# Patient Record
Sex: Female | Born: 1935
Health system: Southern US, Community
[De-identification: ages and names within clinical notes are randomized; demographics above are authoritative.]

## PROBLEM LIST (undated history)

## (undated) DIAGNOSIS — I639 Cerebral infarction, unspecified: Secondary | ICD-10-CM

## (undated) DIAGNOSIS — F329 Major depressive disorder, single episode, unspecified: Secondary | ICD-10-CM

## (undated) DIAGNOSIS — I10 Essential (primary) hypertension: Secondary | ICD-10-CM

## (undated) DIAGNOSIS — F32A Depression, unspecified: Secondary | ICD-10-CM

## (undated) DIAGNOSIS — K219 Gastro-esophageal reflux disease without esophagitis: Secondary | ICD-10-CM

## (undated) HISTORY — DX: Gastro-esophageal reflux disease without esophagitis: K21.9

## (undated) HISTORY — DX: Essential (primary) hypertension: I10

## (undated) HISTORY — PX: GALLBLADDER SURGERY: SHX652

## (undated) HISTORY — DX: Depression, unspecified: F32.A

## (undated) HISTORY — DX: Major depressive disorder, single episode, unspecified: F32.9

## (undated) HISTORY — DX: Cerebral infarction, unspecified: I63.9

## (undated) HISTORY — PX: ABDOMINAL HYSTERECTOMY: SHX81

---

## 2004-07-31 ENCOUNTER — Inpatient Hospital Stay: Payer: Self-pay | Admitting: Internal Medicine

## 2004-07-31 ENCOUNTER — Other Ambulatory Visit: Payer: Self-pay

## 2004-08-04 ENCOUNTER — Encounter: Payer: Self-pay | Admitting: Internal Medicine

## 2004-08-10 ENCOUNTER — Encounter: Payer: Self-pay | Admitting: Internal Medicine

## 2004-09-05 ENCOUNTER — Encounter: Payer: Self-pay | Admitting: Internal Medicine

## 2004-09-10 ENCOUNTER — Encounter: Payer: Self-pay | Admitting: Internal Medicine

## 2004-10-10 ENCOUNTER — Encounter: Payer: Self-pay | Admitting: Internal Medicine

## 2004-11-10 ENCOUNTER — Encounter: Payer: Self-pay | Admitting: Internal Medicine

## 2004-12-10 ENCOUNTER — Encounter: Payer: Self-pay | Admitting: Internal Medicine

## 2005-01-10 ENCOUNTER — Encounter: Payer: Self-pay | Admitting: Internal Medicine

## 2005-02-10 ENCOUNTER — Encounter: Payer: Self-pay | Admitting: Internal Medicine

## 2005-03-12 ENCOUNTER — Encounter: Payer: Self-pay | Admitting: Internal Medicine

## 2005-04-05 ENCOUNTER — Ambulatory Visit: Payer: Self-pay | Admitting: Family Medicine

## 2005-04-12 ENCOUNTER — Encounter: Payer: Self-pay | Admitting: Internal Medicine

## 2005-05-05 ENCOUNTER — Ambulatory Visit: Payer: Self-pay | Admitting: Family Medicine

## 2005-05-12 ENCOUNTER — Encounter: Payer: Self-pay | Admitting: Internal Medicine

## 2005-06-12 ENCOUNTER — Encounter: Payer: Self-pay | Admitting: Internal Medicine

## 2005-07-13 ENCOUNTER — Encounter: Payer: Self-pay | Admitting: Internal Medicine

## 2005-07-19 ENCOUNTER — Ambulatory Visit: Payer: Self-pay | Admitting: Family Medicine

## 2005-07-21 ENCOUNTER — Ambulatory Visit: Payer: Self-pay | Admitting: Family Medicine

## 2005-08-09 ENCOUNTER — Ambulatory Visit: Payer: Self-pay

## 2005-08-10 ENCOUNTER — Encounter: Payer: Self-pay | Admitting: Internal Medicine

## 2005-09-10 ENCOUNTER — Encounter: Payer: Self-pay | Admitting: Internal Medicine

## 2005-10-10 ENCOUNTER — Encounter: Payer: Self-pay | Admitting: Internal Medicine

## 2005-11-10 ENCOUNTER — Encounter: Payer: Self-pay | Admitting: Internal Medicine

## 2006-04-09 ENCOUNTER — Ambulatory Visit: Payer: Self-pay | Admitting: Ophthalmology

## 2006-04-16 ENCOUNTER — Ambulatory Visit: Payer: Self-pay | Admitting: Ophthalmology

## 2006-07-03 ENCOUNTER — Ambulatory Visit: Payer: Self-pay | Admitting: Gastroenterology

## 2007-10-16 ENCOUNTER — Ambulatory Visit: Payer: Self-pay | Admitting: Family Medicine

## 2007-12-02 ENCOUNTER — Encounter: Payer: Self-pay | Admitting: Urology

## 2007-12-11 ENCOUNTER — Encounter: Payer: Self-pay | Admitting: Urology

## 2008-01-11 ENCOUNTER — Encounter: Payer: Self-pay | Admitting: Urology

## 2008-02-11 ENCOUNTER — Encounter: Payer: Self-pay | Admitting: Urology

## 2008-05-19 ENCOUNTER — Ambulatory Visit: Payer: Self-pay | Admitting: Unknown Physician Specialty

## 2008-05-26 ENCOUNTER — Ambulatory Visit: Payer: Self-pay | Admitting: Unknown Physician Specialty

## 2008-07-06 ENCOUNTER — Ambulatory Visit: Payer: Self-pay | Admitting: Ophthalmology

## 2008-08-31 ENCOUNTER — Encounter: Payer: Self-pay | Admitting: Family Medicine

## 2008-09-10 ENCOUNTER — Encounter: Payer: Self-pay | Admitting: Family Medicine

## 2009-08-24 ENCOUNTER — Ambulatory Visit: Payer: Self-pay | Admitting: Family Medicine

## 2010-06-22 ENCOUNTER — Ambulatory Visit: Payer: Self-pay | Admitting: Gastroenterology

## 2010-07-20 ENCOUNTER — Ambulatory Visit: Payer: Self-pay | Admitting: Gastroenterology

## 2010-12-28 ENCOUNTER — Ambulatory Visit: Payer: Self-pay | Admitting: Family Medicine

## 2011-08-02 ENCOUNTER — Ambulatory Visit: Payer: Self-pay | Admitting: Family Medicine

## 2011-09-20 ENCOUNTER — Ambulatory Visit: Payer: Self-pay | Admitting: Gastroenterology

## 2011-12-01 ENCOUNTER — Emergency Department: Payer: Self-pay | Admitting: Emergency Medicine

## 2011-12-07 ENCOUNTER — Encounter: Payer: Self-pay | Admitting: Family Medicine

## 2011-12-11 ENCOUNTER — Encounter: Payer: Self-pay | Admitting: Family Medicine

## 2011-12-11 ENCOUNTER — Emergency Department: Payer: Self-pay | Admitting: Emergency Medicine

## 2012-01-01 ENCOUNTER — Ambulatory Visit: Payer: Self-pay | Admitting: Family Medicine

## 2012-01-11 ENCOUNTER — Encounter: Payer: Self-pay | Admitting: Family Medicine

## 2012-06-07 ENCOUNTER — Emergency Department: Payer: Self-pay | Admitting: Emergency Medicine

## 2013-02-05 ENCOUNTER — Emergency Department: Payer: Self-pay | Admitting: Emergency Medicine

## 2013-03-03 ENCOUNTER — Ambulatory Visit: Payer: Self-pay | Admitting: Orthopedic Surgery

## 2013-05-07 ENCOUNTER — Ambulatory Visit: Payer: Self-pay | Admitting: Family Medicine

## 2013-11-19 ENCOUNTER — Inpatient Hospital Stay: Payer: Self-pay | Admitting: Surgery

## 2013-11-19 LAB — URINALYSIS, COMPLETE
BACTERIA: NONE SEEN
Bilirubin,UR: NEGATIVE
GLUCOSE, UR: NEGATIVE mg/dL (ref 0–75)
Hyaline Cast: 3
Leukocyte Esterase: NEGATIVE
Nitrite: NEGATIVE
Ph: 6 (ref 4.5–8.0)
Protein: 30
RBC,UR: 2 /HPF (ref 0–5)
SPECIFIC GRAVITY: 1.018 (ref 1.003–1.030)

## 2013-11-19 LAB — CBC WITH DIFFERENTIAL/PLATELET
BASOS ABS: 0.1 10*3/uL (ref 0.0–0.1)
BASOS PCT: 1.2 %
EOS PCT: 2.1 %
Eosinophil #: 0.2 10*3/uL (ref 0.0–0.7)
HCT: 42.2 % (ref 35.0–47.0)
HGB: 14 g/dL (ref 12.0–16.0)
Lymphocyte #: 2 10*3/uL (ref 1.0–3.6)
Lymphocyte %: 18.2 %
MCH: 31.8 pg (ref 26.0–34.0)
MCHC: 33.1 g/dL (ref 32.0–36.0)
MCV: 96 fL (ref 80–100)
MONO ABS: 0.8 x10 3/mm (ref 0.2–0.9)
MONOS PCT: 6.9 %
Neutrophil #: 8 10*3/uL — ABNORMAL HIGH (ref 1.4–6.5)
Neutrophil %: 71.6 %
Platelet: 268 10*3/uL (ref 150–440)
RBC: 4.39 10*6/uL (ref 3.80–5.20)
RDW: 13.6 % (ref 11.5–14.5)
WBC: 11.2 10*3/uL — AB (ref 3.6–11.0)

## 2013-11-19 LAB — COMPREHENSIVE METABOLIC PANEL
ALT: 22 U/L (ref 12–78)
Albumin: 3.5 g/dL (ref 3.4–5.0)
Alkaline Phosphatase: 81 U/L
Anion Gap: 8 (ref 7–16)
BILIRUBIN TOTAL: 0.6 mg/dL (ref 0.2–1.0)
BUN: 16 mg/dL (ref 7–18)
CHLORIDE: 103 mmol/L (ref 98–107)
Calcium, Total: 9.5 mg/dL (ref 8.5–10.1)
Co2: 27 mmol/L (ref 21–32)
Creatinine: 1.14 mg/dL (ref 0.60–1.30)
EGFR (African American): 54 — ABNORMAL LOW
GFR CALC NON AF AMER: 46 — AB
Glucose: 96 mg/dL (ref 65–99)
OSMOLALITY: 277 (ref 275–301)
Potassium: 3.4 mmol/L — ABNORMAL LOW (ref 3.5–5.1)
SGOT(AST): 26 U/L (ref 15–37)
SODIUM: 138 mmol/L (ref 136–145)
Total Protein: 8.6 g/dL — ABNORMAL HIGH (ref 6.4–8.2)

## 2013-11-19 LAB — LIPASE, BLOOD: Lipase: 99 U/L (ref 73–393)

## 2013-11-19 LAB — TROPONIN I: Troponin-I: <0.02 ng/mL

## 2013-11-20 LAB — CBC WITH DIFFERENTIAL/PLATELET
Basophil #: 0.1 10*3/uL (ref 0.0–0.1)
Basophil %: 0.7 %
Eosinophil #: 0.4 10*3/uL (ref 0.0–0.7)
Eosinophil %: 3.3 %
HCT: 38.8 % (ref 35.0–47.0)
HGB: 12.7 g/dL (ref 12.0–16.0)
LYMPHS PCT: 22.6 %
Lymphocyte #: 2.5 10*3/uL (ref 1.0–3.6)
MCH: 31.4 pg (ref 26.0–34.0)
MCHC: 32.6 g/dL (ref 32.0–36.0)
MCV: 96 fL (ref 80–100)
Monocyte #: 0.9 x10 3/mm (ref 0.2–0.9)
Monocyte %: 8.1 %
NEUTROS ABS: 7.2 10*3/uL — AB (ref 1.4–6.5)
Neutrophil %: 65.3 %
Platelet: 208 10*3/uL (ref 150–440)
RBC: 4.02 10*6/uL (ref 3.80–5.20)
RDW: 13.7 % (ref 11.5–14.5)
WBC: 11 10*3/uL (ref 3.6–11.0)

## 2013-11-20 LAB — COMPREHENSIVE METABOLIC PANEL
ALBUMIN: 3.1 g/dL — AB (ref 3.4–5.0)
Alkaline Phosphatase: 70 U/L
Anion Gap: 10 (ref 7–16)
BILIRUBIN TOTAL: 0.6 mg/dL (ref 0.2–1.0)
BUN: 14 mg/dL (ref 7–18)
CREATININE: 0.94 mg/dL (ref 0.60–1.30)
Calcium, Total: 8.8 mg/dL (ref 8.5–10.1)
Chloride: 107 mmol/L (ref 98–107)
Co2: 25 mmol/L (ref 21–32)
EGFR (African American): >60
EGFR (Non-African Amer.): 59 — ABNORMAL LOW
Glucose: 98 mg/dL (ref 65–99)
OSMOLALITY: 284 (ref 275–301)
POTASSIUM: 3.1 mmol/L — AB (ref 3.5–5.1)
SGOT(AST): 18 U/L (ref 15–37)
SGPT (ALT): 19 U/L (ref 12–78)
SODIUM: 142 mmol/L (ref 136–145)
Total Protein: 7.3 g/dL (ref 6.4–8.2)

## 2013-11-21 LAB — CBC WITH DIFFERENTIAL/PLATELET
BASOS PCT: 0.9 %
Basophil #: 0.1 10*3/uL (ref 0.0–0.1)
Eosinophil #: 0.3 10*3/uL (ref 0.0–0.7)
Eosinophil %: 3.2 %
HCT: 37.6 % (ref 35.0–47.0)
HGB: 12.3 g/dL (ref 12.0–16.0)
Lymphocyte #: 2.2 10*3/uL (ref 1.0–3.6)
Lymphocyte %: 22.5 %
MCH: 31.5 pg (ref 26.0–34.0)
MCHC: 32.6 g/dL (ref 32.0–36.0)
MCV: 97 fL (ref 80–100)
Monocyte #: 0.8 x10 3/mm (ref 0.2–0.9)
Monocyte %: 8.3 %
NEUTROS ABS: 6.4 10*3/uL (ref 1.4–6.5)
NEUTROS PCT: 65.1 %
PLATELETS: 253 10*3/uL (ref 150–440)
RBC: 3.9 10*6/uL (ref 3.80–5.20)
RDW: 13.8 % (ref 11.5–14.5)
WBC: 9.8 10*3/uL (ref 3.6–11.0)

## 2013-11-21 LAB — COMPREHENSIVE METABOLIC PANEL
ALBUMIN: 3 g/dL — AB (ref 3.4–5.0)
ANION GAP: 10 (ref 7–16)
Alkaline Phosphatase: 64 U/L
BUN: 8 mg/dL (ref 7–18)
Bilirubin,Total: 0.5 mg/dL (ref 0.2–1.0)
CHLORIDE: 105 mmol/L (ref 98–107)
Calcium, Total: 8.7 mg/dL (ref 8.5–10.1)
Co2: 25 mmol/L (ref 21–32)
Creatinine: 0.77 mg/dL (ref 0.60–1.30)
EGFR (African American): >60
EGFR (Non-African Amer.): >60
Glucose: 83 mg/dL (ref 65–99)
Osmolality: 277 (ref 275–301)
Potassium: 3 mmol/L — ABNORMAL LOW (ref 3.5–5.1)
SGOT(AST): 25 U/L (ref 15–37)
SGPT (ALT): 25 U/L (ref 12–78)
SODIUM: 140 mmol/L (ref 136–145)
TOTAL PROTEIN: 7.3 g/dL (ref 6.4–8.2)

## 2013-11-22 LAB — CBC WITH DIFFERENTIAL/PLATELET
Basophil #: 0.1 10*3/uL (ref 0.0–0.1)
Basophil %: 1 %
EOS PCT: 3.6 %
Eosinophil #: 0.3 10*3/uL (ref 0.0–0.7)
HCT: 39.2 % (ref 35.0–47.0)
HGB: 13.3 g/dL (ref 12.0–16.0)
Lymphocyte #: 2.3 10*3/uL (ref 1.0–3.6)
Lymphocyte %: 28.6 %
MCH: 32.3 pg (ref 26.0–34.0)
MCHC: 33.8 g/dL (ref 32.0–36.0)
MCV: 96 fL (ref 80–100)
MONO ABS: 0.6 x10 3/mm (ref 0.2–0.9)
MONOS PCT: 7.4 %
NEUTROS ABS: 4.8 10*3/uL (ref 1.4–6.5)
NEUTROS PCT: 59.4 %
Platelet: 274 10*3/uL (ref 150–440)
RBC: 4.11 10*6/uL (ref 3.80–5.20)
RDW: 13.6 % (ref 11.5–14.5)
WBC: 8 10*3/uL (ref 3.6–11.0)

## 2013-11-22 LAB — BASIC METABOLIC PANEL
ANION GAP: 7 (ref 7–16)
BUN: 6 mg/dL — ABNORMAL LOW (ref 7–18)
CALCIUM: 8.9 mg/dL (ref 8.5–10.1)
CREATININE: 0.85 mg/dL (ref 0.60–1.30)
Chloride: 107 mmol/L (ref 98–107)
Co2: 29 mmol/L (ref 21–32)
EGFR (African American): >60
EGFR (Non-African Amer.): >60
GLUCOSE: 87 mg/dL (ref 65–99)
OSMOLALITY: 282 (ref 275–301)
Potassium: 3.2 mmol/L — ABNORMAL LOW (ref 3.5–5.1)
Sodium: 143 mmol/L (ref 136–145)

## 2013-11-25 LAB — PATHOLOGY REPORT

## 2014-04-03 ENCOUNTER — Ambulatory Visit: Payer: Self-pay | Admitting: Gastroenterology

## 2014-09-24 ENCOUNTER — Encounter
Admit: 2014-09-24 | Disposition: A | Discharge status: Home / Self Care | Payer: Self-pay | Attending: Family Medicine | Admitting: Family Medicine

## 2014-10-03 NOTE — Op Note (Signed)
PATIENT NAME:  Alyssa Adams, [name] MR#:  [id number] DATE OF BIRTH:  08/07/1933  DATE OF PROCEDURE:  11/22/2013  PREOPERATIVE DIAGNOSIS: Acute calculus cholecystitis.   POSTOPERATIVE DIAGNOSIS: Acute calculus cholecystitis.   PROCEDURE PERFORMED: Laparoscopic cholecystectomy.   SURGEON: Natale LayMark Tarrah Furuta, MD   ASSISTANT: None.   ANESTHESIA: General endotracheal.   FINDINGS: Acute cholecystitis.   SPECIMENS: Gallbladder with contents.   ESTIMATED BLOOD LOSS: 25 mL.   DRAINS: None.  LAP and needle count correct x 2.   DESCRIPTION OF PROCEDURE: With informed consent, supine position, general endotracheal anesthesia, the patient's abdomen was widely prepped and draped with ChloraPrep solution. Timeout was observed.   A 12-mm blunt Hasson trocar was placed in an open technique through an infraumbilical transverse skin incision with stay sutures being passed through the fascia. Pneumoperitoneum was established. The patient was then positioned in reverse Trendelenburg and airplane right side up. A 5-mm Bladeless trocar was placed in the epigastric region, two 5-mm first assistant ports the right subcostal margin. The gallbladder was aspirated of thin bile. It was grasped along its fundus and elevated towards the right shoulder and laterally. Omental adhesions were taken off the body of the gallbladder with blunt technique and point cautery and small bleeding vessels.   The hepatoduodenal ligament was then incised utilizing blunt technique and hook electrocautery. A single cystic duct and a single cystic artery were identified critically identified with a critical view of safety cholecystectomy. Three clips were placed on the portal side of each structure, 1 clip on the gallbladder side and the structures were then divided. The gallbladder was then retrieved off the gallbladder fossa utilizing hook cautery apparatus, placed into an Endo Catch device and retrieved. Pneumoperitoneum was then re-established.    Hemostasis was obtained in the gallbladder fossa with point cautery. Five milliliters of Surgiflo with thrombin application was applied. Ports were then removed under direct visualization. The infraumbilical fascial defect being reapproximated with an additional figure-of-eight #0 Vicryl suture in vertical orientation. The existing stay sutures tied to each other. Skin edges were reapproximated utilizing 4-0 nylon simple and vertical mattress orientation. A total of 30 mL of 0.25% plain Marcaine was infiltrated prior to the conclusion of the operation, Telfa and Tegaderm were then applied and the patient was then subsequently extubated and taken to the recovery room in stable and satisfactory condition by anesthesia services.    ____________________________ Redge GainerMark A. Egbert GaribaldiBird, MD mab:lt D: 11/22/2013 11:19:00 ET T: 11/22/2013 22:11:55 ET JOB#: [id number]  cc: Loraine LericheMark A. Egbert GaribaldiBird, MD, <Dictator> Raynald KempMARK A Nohely Whitehorn MD ELECTRONICALLY SIGNED 11/27/2013 10:57

## 2014-10-03 NOTE — Consult Note (Signed)
PATIENT NAME:  Alyssa Adams, Alyssa Adams MR#:  [id number] DATE OF BIRTH:  May 15, 1934  DATE OF CONSULTATION:  11/19/2013  REFERRING PHYSICIAN:   CONSULTING PHYSICIAN:  Adah Salvage. Excell Seltzer, MD  CHIEF COMPLAINT: Abdominal pain.   HISTORY OF PRESENT ILLNESS: This is a patient with the acute onset of abdominal pain on Sunday. Her pain is improved now, but she had multiple emeses, the last one being yesterday.  Finally, because it was unrelenting but improved, her husband made her come to the Emergency Room. She denies jaundice, denies fevers, denies acholic stools. Has never had an episode like this before and points to her epigastrium and right upper quadrant. She has no back pain. Has had multiple emeses most recently yesterday. Emesis started on Sunday. She has not been able to take any of her medications, including metoprolol or Aggrenox since her last dose was taken on Saturday.   Of note, the patient states that she is sometimes confused and she attributes this to her prior stroke in 2006 for which she has residual right-sided weakness, but no speech problems.   PAST MEDICAL HISTORY: Stroke and hypertension.   PAST SURGICAL HISTORY: Hysterectomy sparing the ovaries and appendix. No other abdominal surgery.   ALLERGIES: ERYTHROMYCIN, MACRODANTIN, SULFA, BANANAS, AND PEANUTS.   MEDICATIONS: Multiple, see chart, including Aggrenox and metoprolol.   FAMILY HISTORY: Noncontributory.   SOCIAL HISTORY: The patient was a Presenter, broadcasting for [employer], is now retired. Has never smoked and does not drink alcohol.  REVIEW OF SYSTEMS:  A 10-system review is performed and negative with the exception of that mentioned in the HPI.   PHYSICAL EXAMINATION:  GENERAL: Comfortable -appearing female patient.  VITAL SIGNS: Temperature 98.1, pulse of 80, respirations 20, blood pressure 182/80, 95% room air saturation, pain scale of 2.  HEENT:  Shows no scleral icterus.  NECK: No palpable neck nodes.  CHEST: Clear  to auscultation.  CARDIAC: Regular rate and rhythm.  ABDOMEN: Soft. There is a reducible, nontender umbilical hernia. She is tender in the right upper quadrant with a positive Murphy's sign.  EXTREMITIES: Show minimal edema.  NEUROLOGIC: Grossly intact with the exception of weakness in her right arm and grip. Her speech is normal.  INTEGUMENT:  Shows no jaundice.   LABORATORY VALUES: Showed normal liver function tests. Albumin is 3.5. White blood cell count is 11, H and H of 14 and 42, a platelet count of 268,000. An ultrasound confirms the presence of gallstones. It states that there is no sonographic Murphy's sign noted.  A 1.7 cm stone in the gallbladder with sludge and gallbladder wall thickening and pericholecystic fluid.   ASSESSMENT AND PLAN: This is a patient with likely acute cholecystitis by history and physical. Her vomiting has stopped, but she has not taken any of her medications since Saturday. She is on Aggrenox, which as an anticoagulant can be quite dangerous for surgery in a patient who has improved in her pain since the onset and who has a negative sonographic Murphy's sign, but I believe she has a clinical Murphy's sign. She should have surgery this hospitalization, but it would be safest to wait a few days to allow the effects of the Aggrenox to diminish, and I have asked the Emergency Room physician, Dr. Lenard Lance, to ask medicine to admit her and we would talk about surgery in a few days once IV antibiotics have been instituted and she has been placed back on her metoprolol, etc., holding her Aggrenox. The rationale for this has  been discussed and the patient understood and agreed with this plan as did Dr. Lenard LancePaduchowski.    ____________________________ Adah Salvageichard E. Excell Seltzerooper, MD rec:dd D: 11/19/2013 20:22:56 ET T: 11/19/2013 20:29:41 ET JOB#: [id number]  cc: Adah Salvageichard E. Excell Seltzerooper, MD, <Dictator> Lattie HawICHARD E Yosgart Pavey MD ELECTRONICALLY SIGNED 11/20/2013 6:54

## 2014-10-03 NOTE — Consult Note (Signed)
Brief Consult Note: Diagnosis: acute cholecystitis.   Patient was seen by consultant.   Consult note dictated.   Recommend further assessment or treatment.   Discussed with Attending MD.   Comments: Acute chole improving pain but needs surgery. Unfortunately pt is on Aggrenox, has not had dose nor metoprolol dose since Saturday. Rec med admit, optimize med management with IV abx and surgery once safe. Discussed with ER MD. Will follow.  Electronic Signatures: Lattie Hawooper, Aaro Meyers E (MD)  (Signed 10-Jun-15 20:10)  Authored: Brief Consult Note   Last Updated: 10-Jun-15 20:10 by Lattie Hawooper, Lindzey Zent E (MD)

## 2014-10-03 NOTE — H&P (Signed)
PATIENT NAME:  Alyssa Adams, Vernadine M MR#:  [id number] DATE OF BIRTH:  May 11, 1934  DATE OF ADMISSION:  11/19/2013  PRIMARY CARE PHYSICIAN: Dr. Tera MaterJim Hedrick.   REFERRING PHYSICIAN: Dr. Lenard LancePaduchowski.   CHIEF COMPLAINT: Nausea, vomiting, abdominal pain.   HISTORY OF PRESENT ILLNESS: Alyssa Adams is a 81 year old, pleasant, white female with history of hypertension, history of stroke about 10 years back with residual right-sided weakness. Has been experiencing nausea, vomiting since Sunday. Sunday morning, the patient woke up, started to experience pain in the right upper quadrant, radiating to the back. Followed by experiencing severe nausea. Since then has been experiencing multiple episodes of vomiting. Was unable to keep down any food or medication. The patient continued to have bloating and fullness of the stomach. Had small diarrhea today. Did not notice to have any fever. Concerning this, the patient came to the Emergency Department. Workup in the Emergency Department: The patient is found to have acute cholecystitis on ultrasound of the abdomen. The patient is found to have mild elevation of the WBC count of 11,000. The patient was seen by general surgery as a consult in the Emergency Department. Recommended the patient to be admitted to the medicine service as the patient has been on Aggrenox. The patient has been off of Aggrenox for the last 4 days. Surgery feels that the patient needs to wait for another 3 days before the patient proceeds for the surgery.   PAST MEDICAL HISTORY:  1. Hypertension.  2. Previous history of CVA.   PAST SURGICAL HISTORY: Hysterectomy.   ALLERGIES:  1. ERYTHROMYCIN.  2. MACRODANTIN.   3. SULFA.    HOME MEDICATIONS:  1. Protonix 40 mg once a day.  2. Metoprolol 50 mg once a day.  3.  75 mg once a day.  4. Amlodipine 5 mg once a day.  5. Aggrenox 25/200 mg once a day.   SOCIAL HISTORY: No history of smoking, drinking alcohol or using illicit drugs. Married,  lives with her husband. Walk with the help of a walker. Dependent of ADLs.   FAMILY HISTORY: Son died of diabetes mellitus and complications.   REVIEW OF SYSTEMS:  CONSTITUTIONAL: Experiencing generalized weakness.  EYES: No change in vision.  ENT: No change in hearing.  RESPIRATORY: No cough, shortness of breath.  CARDIOVASCULAR: No chest pain, palpations.  GASTROINTESTINAL: Has nausea, vomiting and abdominal pain.  GENITOURINARY: No dysuria or hematuria.  HEMATOLOGIC: No easy bruising or bleeding.  SKIN: No rashes or lesions.  MUSCULOSKELETAL: No joint pains and aches.  NEUROLOGIC: no weakness or numbness in any part of the body.   PHYSICAL EXAMINATION:  GENERAL: This is a well-built, well-nourished, age-appropriate female lying down in the bed, not in distress.  VITAL SIGNS: Temperature 98.1, pulse 74, blood pressure 167/73, respiratory rate of 20, oxygen saturation 94% on room air.  HEENT: Head normocephalic, atraumatic. There is no scleral icterus. Conjunctivae normal. Pupils equal and react to light. Extraocular movements are intact. Mucous membranes moist. No pharyngeal erythema.  NECK: Supple. No lymphadenopathy. No JVD. No carotid bruit.  CHEST: Has no focal tenderness.  LUNGS: Bilaterally clear to auscultation.  HEART: S1, S2 regular. No murmurs are heard.  ABDOMEN: Bowel sounds present. Soft. Has mild tenderness in the right upper quadrant. No rebound or guarding.  EXTREMITIES: No pedal edema. Pulses 2+.  NEUROLOGIC: The patient is alert, oriented to place, person and time. Cranial nerves II through XII intact. Motor 5/5 in upper and lower extremities.  SKIN: No rash or lesions.  MUSCULOSKELETAL: Good range of motion in all of the extremities.   LABORATORIES: UA negative for nitrites and leukocyte esterase. Lipase 99. CBC all of the values are within normal limits. CMP is completely within normal limits.   ASSESSMENT AND PLAN: Alyssa Adams is a 81 year old female who  comes with acute cholecystitis.  1. Acute cholecystitis: Keep the patient n.p.o. If the patient is able to tolerate liquids, will start the patient on clear liquids and start back on home medications. Will keep the patient on Zosyn. Surgery plans to do a cholecystectomy in the next 2 to 3 days. The patient denies having any recent stress test or any other cardiac workup recently. The patient has a history of stroke. As the patient did not have any recent cardiac workup and the patient having ischemic stroke, keep the patient at the risk of moderate risk for the urgency. Continue with the beta blocker. Hold the Aggrenox.   2. Hypertension: Moderately controlled. Start back on home medications once the patient is able to tolerate the diet.  3. History of cerebrovascular accident: Has mild residual weakness on the right side.  4. Keep the patient on deep vein thrombosis prophylaxis with Lovenox.   TIME SPENT: 50 minutes.    ____________________________ Susa Griffins, MD pv:gb D: 11/19/2013 22:01:22 ET T: 11/19/2013 22:46:29 ET JOB#: [id number]  cc: Susa Griffins, MD, <Dictator> Rhona Leavens. Burnett Sheng, MD Clerance Lav Tonie Vizcarrondo MD ELECTRONICALLY SIGNED 11/20/2013 21:11

## 2014-10-03 NOTE — Discharge Summary (Signed)
PATIENT NAME:  Alyssa Adams, Desira M MR#:  [id number] DATE OF BIRTH:  12-07-1933  DATE OF ADMISSION:  11/19/2013 DATE OF DISCHARGE:  11/26/2013  FINAL DIAGNOSES:  1.  Acute calculus cholecystitis.  2.  Hypertension.  3.  Previous cerebrovascular accident with need for Aggrenox and residual right-sided weakness.   PRINCIPAL PROCEDURES:  1.  Medicine consultation.  2.  Laparoscopic cholecystectomy on 06/13. 3.  Physical therapy evaluation. 4.  Case management evaluation.   HOSPITAL COURSE SUMMARY: The patient was admitted to the medical service. She was seen by medicine and admitted. Her Aggrenox was held. HIDA scan was performed which demonstrated nonvisualization of the gallbladder. This was performed on the 12th of June. Cholecystectomy was performed on the 13th of June. Postoperatively, she had an uneventful postoperative course, except for significant immobility secondary to previous CVA. Her diet was able to be advanced. She tolerated this well. Arrangements were made for home health with physical therapy assistance. She was stable for discharge on the 17th of June. Follow up in my office in one weeks' time.   ____________________________ Redge GainerMark A. Egbert GaribaldiBird, MD mab:sb D: 12/04/2013 21:30:18 ET T: 12/05/2013 08:12:14 ET JOB#: [id number]  cc: Loraine LericheMark A. Egbert GaribaldiBird, MD, <Dictator> MARK A BIRD MD ELECTRONICALLY SIGNED 12/08/2013 10:06

## 2014-10-05 LAB — SURGICAL PATHOLOGY

## 2014-10-13 ENCOUNTER — Encounter: Payer: Self-pay | Admitting: Physical Therapy

## 2014-10-15 ENCOUNTER — Encounter: Payer: Self-pay | Admitting: Physical Therapy

## 2014-10-15 ENCOUNTER — Ambulatory Visit: Payer: PPO | Admitting: Physical Therapy

## 2014-10-15 DIAGNOSIS — M6281 Muscle weakness (generalized): Secondary | ICD-10-CM | POA: Diagnosis not present

## 2014-10-15 DIAGNOSIS — R262 Difficulty in walking, not elsewhere classified: Secondary | ICD-10-CM | POA: Insufficient documentation

## 2014-10-15 NOTE — Therapy (Signed)
[facility] [address] [place], [place], [address] Phone: [phone]   Fax:  [phone]  Physical Therapy Treatment  Patient Details  Name: Alyssa FifeJanie M Adams MRN: [id number] Date of Birth: 12/15/1933 Referring Provider:  No ref. provider found  Encounter Date: 10/15/2014      PT End of Session - 10/15/14 1032    Visit Number 6   Number of Visits 16   Date for PT Re-Evaluation 11/19/14   PT Start Time 0938   PT Stop Time 1025   PT Time Calculation (min) 47 min   Equipment Utilized During Treatment Gait belt;Other (comment)  Wheeled walker, parallel bars.    Activity Tolerance Patient tolerated treatment well   Behavior During Therapy [facility] for tasks assessed/performed;Flat affect      Past Medical History  Diagnosis Date  . Depression     Past Surgical History  Procedure Laterality Date  . Gallbladder surgery    . Abdominal hysterectomy      There were no vitals filed for this visit.  Visit Diagnosis:  Difficulty in walking  Muscle weakness (generalized)      Subjective Assessment - 10/15/14 1029    Subjective Patient states she has had increasing difficulty with ambulation this week, she is unsure why.    Pertinent History Pt had a CVA impacting her R side.    Limitations Walking   Patient Stated Goals To improve her balance and independence with ambulation.    Currently in Pain? --  Pt does not complain of any pain today.        Gait Training  Knee flexion marching forwards x 10' x 4 repetitions with cuing for true knee flexion to promote erect posture. Patient with several incidents of lateral loss of balance.  Backwards ambulation x 10' x 2 repetitions with cuing for increased stance time on RLE  Tandem stance walking x 10' forwards and backwards x 6 repetitions total with cuing for increased stance time and maintaining RLE knee extension during weight bearing. Patient improved throughout the  session and eventually completed without any lateral loss of balance.  Patient ambulated 30' x 2 with wheeled walker with cue to "walk as fast as you can" and cues to promote right knee flexion to increase stride length and balance.  Independent ambulation in parallel bars with cuing for increased right LE weight shifting and knee flexion to initiate swing phase.   Neuromuscular Re-education  Single leg stance x 10 bilaterally with cuing for knee flexion, rather than hip flexion and knee extension. Pt was able to maintain <3 seconds on each attempt on RLE, LLE pt was able to complete 2x for 10+ seconds.  Tandem stance bilaterally on blue foam pad, cuing for hip extensor activation.  Step ups to 4" box x 10 repetitions with RLE on the box to initiaite.  Step ups to 6" box x 10 repetitions with RLE on the box to initiate. Cuing for knee extension on RLE and controlling lateral trunk displacement.  Standing hip abductions x 10 for 2 sets bilaterally, 1 HHA when weight bearing on RLE. Cuing for feet facing forward and not substituting TFL.  Standing hip extensions x 10 bilaterally cuing to maintain knee extension.                        PT Education - 10/15/14 1031    Education provided Yes   Education Details Patient cued on activating  her posterolateral hip musculature during single leg stance, shifting her weight to her RLE, keeping her right knee extended in weight bearing, and flexing her right knee to advance it in swing phase.    Person(s) Educated Patient   Methods Explanation;Demonstration;Tactile cues;Verbal cues   Comprehension Returned demonstration;Verbalized understanding;Tactile cues required             PT Long Term Goals - 10/15/14 1042    PT LONG TERM GOAL #1   Title Patient (>= 79 years old) will increase single limb stance time to > 14 seconds to reduce fall risk during single limb stance activities by 11/19/2014.   Status New   PT LONG TERM GOAL #2    Title Patient will complete a TUG test in < 12 seconds without an AD for independent mobility and decreased fall risk  by 11/19/2014.   Status New   PT LONG TERM GOAL #3   Title Patient (< 79 years old) will complete five times sit to stand test in < 10 seconds indicating an increased LE strength and improved balance  by 11/19/2014.   Status New               Plan - 10/15/14 1033    Clinical Impression Statement Pt progressed throughout session today with her ability to weight bear and maintain single leg balance on her RLE. She continues to present with decreased strength and dynamic balance on her RLE, impairing her safety with independent mobility. Pt responded well to cuing throughout the session and was able to independently step up to and off of a 6" step for 10 repetitions after initially struggling to ascend a 4" step. Pt would benefit from continued training for RLE strengthening, single leg stance on RLE, weight shifting to RLE, and dynamic balance.    Pt will benefit from skilled therapeutic intervention in order to improve on the following deficits Abnormal gait;Decreased endurance;Difficulty walking;Decreased strength;Decreased mobility;Impaired UE functional use;Decreased balance   Rehab Potential Good   Clinical Impairments Affecting Rehab Potential Patient has residual weakness and postural deficits from CVA impacting her R side.    PT Frequency 2x / week   PT Duration 8 weeks   PT Treatment/Interventions Gait training;Therapeutic exercise;Therapeutic activities   PT Next Visit Plan Continue to progress single leg activities and work on increasing her weight bearing on her RLE. Increase LE strengthening and dynamic gait activities for additional balance training.    PT Home Exercise Plan Patient states she has an existing home exercise program, it is not clear how adherent she is to it. She would benefit from an update to this exercise program.    Recommended Other Services  Occupational therapy if she is not already receiving.    Consulted and Agree with Plan of Care Patient        Problem List There are no active problems to display for this patient.  Kerin RansomPatrick A McNamara, PT, DPT    10/15/2014, 1:31 PM  [place] [facility] [address] [place], [place], [address] Phone: [phone]   Fax:  [phone]

## 2014-10-20 ENCOUNTER — Ambulatory Visit: Payer: PPO | Admitting: Physical Therapy

## 2014-10-20 DIAGNOSIS — M6281 Muscle weakness (generalized): Secondary | ICD-10-CM

## 2014-10-20 DIAGNOSIS — R262 Difficulty in walking, not elsewhere classified: Secondary | ICD-10-CM | POA: Diagnosis not present

## 2014-10-20 NOTE — Patient Instructions (Addendum)
HIP / KNEE: Extension - Sit to Stand   Sitting, lean chest forward, raise hips up from surface. Straighten hips and knees. Weight bear equally on left and right sides. Backs of legs should not push off surface. _8__ reps per set, _2__ sets per day, _1__ days per week Use assistive device as needed. Make sure you are in a safe spot, work on controlling your landing! Soft landings and make sure someone is near you.   Copyright  VHI. All rights reserved.   ABDUCTION: Standing (Active)   Stand, feet flat. Lift right leg out to side. Use red theraband. . Complete _2__ sets of _10__ repetitions. Perform __1_ sessions per day. ** Make sure you are holding on to something!  http://gtsc.exer.us/111   Copyright  VHI. All rights reserved. Marching in Place: Varied Surfaces   March in place, slowly lifting knees toward ceiling. Repeat __10__ times per session. Do __2__ sessions per day.  **Make sure you are holding on to something in front of you!  Copyright  VHI. All rights reserved.

## 2014-10-20 NOTE — Therapy (Signed)
[facility] [address] , [place], [address] Phone: [phone]   Fax:  [phone]  Physical Therapy Treatment  Patient Details  Name: Alyssa FifeJanie M Mcclellan MRN: [id number] Date of Birth: 10/17/1933 Referring Provider:  No ref. provider found  Encounter Date: 10/20/2014      PT End of Session - 10/20/14 1030    Visit Number 7   Number of Visits 16   Date for PT Re-Evaluation 11/19/14   PT Start Time 0935   PT Stop Time 1020   PT Time Calculation (min) 45 min   Equipment Utilized During Treatment Gait belt;Other (comment)   Activity Tolerance Patient tolerated treatment well   Behavior During Therapy [facility] for tasks assessed/performed;Flat affect      Past Medical History  Diagnosis Date  . Depression     Past Surgical History  Procedure Laterality Date  . Gallbladder surgery    . Abdominal hysterectomy      There were no vitals filed for this visit.  Visit Diagnosis:  Difficulty in walking  Muscle weakness (generalized)      Subjective Assessment - 10/20/14 1028    Subjective Patient states she has lost her previous HEP and has not been keeping up with exercises. She states she has been ambulating around the house on occassion without an AD, but has railing she holds on to.    Pertinent History Pt had a CVA impacting her R side.    Limitations Walking   Patient Stated Goals To improve her balance and independence with ambulation.    Currently in Pain? No/denies      Neuromuscular Re-education Standing hip abduction with red theraband x 10 bilaterally - cuing for keeping foot pointed forwards.  Single leg stance bilaterally x 10 repetitions, maximum holds on RLE < 3 seconds, > 10 seconds on LLE.  Sit to stand training from mat table 3 sets x 5 repetitions with cuing for going as fast as she can. Sit to stand training from chair with rollator walker x 5 repetitions, with cuing for anterior weight  shifting. Patient completed 5x sit to stand from chair in 30.28 seconds with cga x1.  Static single leg balance toe touching of cones x 6 bilaterally, cuing for midfoot weight bearing bilaterally to initiate single leg stance and to maintain level shoulders.    Gait Training  Lateral Walking in // bars bilaterally x 4 repetitions, patient cued to increase LLE stride length and flexing right knee to increase weight shifting to RLE.  Ambulation without AD on level surface with cuing for increased stride length on LLE, 6 x 15'. Patient completed turns independently as well, with cuing for taking shorter steps to maintain her COG over her BOS.  Ambulation with toe touching of cones x 2 repetitions and 8 cones. Patient was unable to keep cones upright secondary to decreased single leg stance balance.  Attempted backward ambulation x 10' outside of parallel bars with no AD, however she was not comfortable taking elongated steps with LLE secondary to decreased balance on RLE.  Tandem walking forwards and retro 6 repetitions total x 10' in // bars. Patient required increased stride length with RLE going backwards secondary to decreased single leg balance.                           PT Education - 10/20/14 1029    Education provided Yes   Education Details Patient  educated to ambulate with an assistive device at all times. Patient educated on HEP and provided handout. Patient encouraged to take longer steps with her LLE and flex her RLE to initiate swing phase.    Person(s) Educated Patient   Methods Explanation;Demonstration;Tactile cues;Verbal cues   Comprehension Verbalized understanding;Returned demonstration;Tactile cues required;Verbal cues required             PT Long Term Goals - 10/20/14 1043    PT LONG TERM GOAL #1   Title Patient (> 79 years old) will increase single limb stance time to > 14 seconds to reduce fall risk during single limb stance activities by  11/19/2014.   Status New   PT LONG TERM GOAL #2   Title Patient will complete a TUG test in < 12 seconds without an AD for independent mobility and decreased fall risk  by 11/19/2014.   Status New   PT LONG TERM GOAL #3   Title Patient (< 79 years old) will complete five times sit to stand test in < 10 seconds indicating an increased LE strength and improved balance  by 11/19/2014.   Status New               Plan - 10/20/14 1033    Clinical Impression Statement Patient displays improving dynamic balance as evidenced by independent ambulation outside of the parallel bars. Patient educated to ambulate only with walker in the home, as she has decreased balance during turns secondary to RLE weakness and decreased RLE single leg stance. Patient demonstrates improving LE strength as she is able to stand for sets of 5 repettitions from mat table, however she is unable to independelntly arise from a chair without AD and cga x 1. Skilled physical therapy is indicated to continue to address her LE weakness and decreased balance to promote safety with mobility.    Pt will benefit from skilled therapeutic intervention in order to improve on the following deficits Abnormal gait;Decreased endurance;Difficulty walking;Decreased strength;Decreased mobility;Impaired UE functional use;Decreased balance   Rehab Potential Good   Clinical Impairments Affecting Rehab Potential Patient has residual weakness and postural deficits from CVA impacting her R side.    PT Frequency 2x / week   PT Duration 8 weeks   PT Treatment/Interventions Gait training;Therapeutic exercise;Therapeutic activities;Neuromuscular re-education   PT Next Visit Plan Assess how HEP went, re-assess goals as needed. Continue to progress with dynamic and static balance activities.    PT Home Exercise Plan Patient provided with Hip marching in place, band resisted standing hip abductions, and sit to stand training as HEP via handout.    Recommended  Other Services Occupational therapy for RUE.    Consulted and Agree with Plan of Care Patient        Problem List There are no active problems to display for this patient.  Kerin RansomPatrick A Khalise Billard, PT, DPT    10/20/2014, 10:47 AM  [facility] [address] [place], [place], [address] Phone: [phone]   Fax:  [phone]

## 2014-10-22 ENCOUNTER — Encounter: Payer: Self-pay | Admitting: Physical Therapy

## 2014-10-27 ENCOUNTER — Encounter: Payer: Self-pay | Admitting: Physical Therapy

## 2014-10-27 ENCOUNTER — Ambulatory Visit: Payer: PPO | Attending: Family Medicine | Admitting: Physical Therapy

## 2014-10-27 DIAGNOSIS — R262 Difficulty in walking, not elsewhere classified: Secondary | ICD-10-CM | POA: Diagnosis not present

## 2014-10-27 DIAGNOSIS — M6281 Muscle weakness (generalized): Secondary | ICD-10-CM

## 2014-10-27 NOTE — Therapy (Signed)
[facility] [address] [place], [place], [address] Phone: [phone]   Fax:  [phone]  Physical Therapy Treatment  Patient Details  Name: Alyssa FifeJanie M Adams MRN: [id number] Date of Birth: 10/07/1933 Referring Provider:  Jerl MinaHedrick, James, MD  Encounter Date: 10/27/2014      PT End of Session - 10/27/14 0942    Visit Number 8   Number of Visits 17   Date for PT Re-Evaluation 11/19/14   PT Start Time 0900   PT Stop Time 0945   PT Time Calculation (min) 45 min   Equipment Utilized During Treatment Gait belt   Activity Tolerance Patient tolerated treatment well   Behavior During Therapy [facility] for tasks assessed/performed;Flat affect      Past Medical History  Diagnosis Date  . Depression     Past Surgical History  Procedure Laterality Date  . Gallbladder surgery    . Abdominal hysterectomy      There were no vitals filed for this visit.  Visit Diagnosis:  Muscle weakness (generalized)      Subjective Assessment - 10/27/14 0940    Subjective Patient continues to do her exercises but this weekend she was very tired and slept a lot.         Standing hip abduction with red theraband x 10 bilaterally - cuing for keeping foot pointed forwards.  Single leg stance bilaterally x 10 repetitions, maximum holds on RLE < 3 seconds, > 10 seconds on LLE.  Squats with cuing for posture Sit to stand training from chair with rollator walker x 5 repetitions, with cuing for anterior weight shifting. .  Static single leg balance toe touching of cones x 6 bilaterally, cuing for midfoot weight bearing bilaterally to initiate single leg stance and to maintain level shoulders.  Patient has left sided neglect and needs cuing to keep walker away form objects and walls. .                            PT Education - 10/27/14 0941    Education provided Yes   Education Details Patient educated about safey in  the home and using her assistive device.    Methods Explanation;Demonstration;Tactile cues   Comprehension Verbalized understanding;Returned demonstration             PT Long Term Goals - 10/20/14 1043    PT LONG TERM GOAL #1   Title Patient (> 79 years old) will increase single limb stance time to > 14 seconds to reduce fall risk during single limb stance activities by 11/19/2014.   Status New   PT LONG TERM GOAL #2   Title Patient will complete a TUG test in < 12 seconds without an AD for independent mobility and decreased fall risk  by 11/19/2014.   Status New   PT LONG TERM GOAL #3   Title Patient (< 79 years old) will complete five times sit to stand test in < 10 seconds indicating an increased LE strength and improved balance  by 11/19/2014.   Status New               Problem List There are no active problems to display for this patient.   Alyssa InaMansfield, Alyssa Adams S 10/27/2014, 9:53 AM  [facility] [address] [place], [place], [address] Phone: [phone]   Fax:  [phone]

## 2014-10-29 ENCOUNTER — Encounter: Payer: Self-pay | Admitting: Physical Therapy

## 2014-10-29 ENCOUNTER — Ambulatory Visit: Payer: PPO | Admitting: Physical Therapy

## 2014-10-29 DIAGNOSIS — M6281 Muscle weakness (generalized): Secondary | ICD-10-CM

## 2014-10-29 DIAGNOSIS — R262 Difficulty in walking, not elsewhere classified: Secondary | ICD-10-CM

## 2014-10-29 NOTE — Therapy (Signed)
[place] MAIN [facility] SERVICES [address] [place], [place], [address] Phone: [phone]   Fax:  [phone]  Physical Therapy Treatment  Patient Details  Name: Alyssa Adams MRN: [id number] Date of Birth: March 31, 1934 Referring Provider:  [place] Pink, MD  Encounter Date: 10/29/2014      PT End of Session - 10/29/14 0856    Visit Number 9   Number of Visits 17   Date for PT Re-Evaluation 11/19/14   PT Start Time 0835   PT Stop Time 0915   PT Time Calculation (min) 40 min   Equipment Utilized During Treatment Gait belt   Activity Tolerance Patient tolerated treatment well;Patient limited by fatigue   Behavior During Therapy [facility] for tasks assessed/performed      Past Medical History  Diagnosis Date  . Depression     Past Surgical History  Procedure Laterality Date  . Gallbladder surgery    . Abdominal hysterectomy      There were no vitals filed for this visit.  Visit Diagnosis:  Muscle weakness (generalized)  Difficulty in walking      Subjective Assessment - 10/29/14 0845    Subjective Patient states her exercises have been going ok at home. She states she thought she was not making progress. Patient reports no new falls and that she can notice an improvement in her balance at home.    Pertinent History Pt had a CVA impacting her R side.    Limitations Walking   Patient Stated Goals To improve her balance and independence with ambulation.    Currently in Pain? No/denies            [facility] PT Assessment - 10/29/14 0001    Sit to Stand   Comments --  5x sit to stand 17.8 seconds, high falls risk   Ambulation/Gait   Gait velocity 0.39 m/s   6 Minute Walk- Baseline   6 Minute Walk- Baseline yes  325 feet, household ambulator   Timed Up and Go Test   Normal TUG (seconds) 20.4      Gait Training 6 minute walk - 325 feet with cuing for increased RLE stride length and increased knee flexion, which minimized  her dragging her right foot  TUG - 20.4 seconds , increased time during turning portion.  28mwalk time - 0.39 m/s, increased from 0.31 m/s during evaluation.  Tandem walking forwards and retro in // bars x 10' x 2   Neuromuscular re-education Marching in place x 12 bilaterally, with no hand support to increase SLS time.  Single leg stance practice x 10 bilaterally. Completed max of ~2 seconds on RLE. Cuing for increased left hip abduction during stance as she continues to bring her left leg into adduction.  Weight shifting to TTWB on blue foam airex pad x 10 bilaterally with cuing for more upright posture 5x sit to stand  - 17.8 seconds  Weight shifting practice with RLE on small blue pad and LLE on firm surface, cuing to shift weight from LLE to RLE. She completed this after several attempts. X 12 repetitions.                        PT Education - 10/29/14 1422    Education provided Yes   Education Details Patient educated on her significant improvements with therapy, and to continue with HEP targeted on single leg stance and RLE strengthening.    Person(s) Educated Patient  Methods Explanation;Demonstration;Tactile cues  Left HEP addition on desk, marching in place    Comprehension Verbalized understanding;Returned demonstration             PT Long Term Goals - 11-22-14 0856    PT LONG TERM GOAL #1   Title Patient (> 95 years old) will increase single limb stance time to > 14 seconds to reduce fall risk during single limb stance activities by 11/19/2014.   Status On-going   PT LONG TERM GOAL #2   Title Patient will complete a TUG test in < 12 seconds without an AD for independent mobility and decreased fall risk  by 11/19/2014.   Status Partially Met   PT LONG TERM GOAL #3   Title Patient (< 24 years old) will complete five times sit to stand test in < 10 seconds indicating an increased LE strength and improved balance  by 11/19/2014.   Status Partially Met   PT  LONG TERM GOAL #4   Title Patient will ambulate at least 400' in 6 minutes to display improved endurance to complete household mobility by 11/19/2014.    Status New               Plan - 11/22/2014 1424    Clinical Impression Statement Patient displayed significant and substantial improvements in her 6 minute walk, 5x sit to stand, and TUG tests today. She continues to display an inability to maintain single leg stance on her RLE secondary to posterolateral hip weakness. Patient displaying improved gait speed and turning stability. Patient would benefit from continued work on endurance, gait speed, strength, and single leg balance/weight shifting to improve her safety and tolerance for mobility. Skilled PT services are indicated to address her balance deficits.    Pt will benefit from skilled therapeutic intervention in order to improve on the following deficits Abnormal gait;Decreased endurance;Difficulty walking;Decreased strength;Decreased mobility;Impaired UE functional use;Decreased balance   Rehab Potential Good   Clinical Impairments Affecting Rehab Potential Patient has residual weakness and postural deficits from CVA impacting her R side.    PT Frequency 2x / week   PT Duration 8 weeks   PT Treatment/Interventions Gait training;Therapeutic exercise;Therapeutic activities;Neuromuscular re-education   PT Next Visit Plan Increase RLE strengthening, especially with knee flexion, weight shifting, single leg stance.    PT Home Exercise Plan Continue with current HEP    Consulted and Agree with Plan of Care Patient          G-Codes - 22-Nov-2014 1431    Functional Assessment Tool Used 6 minute walk, 49mgait speed, TUG, 5x sit to stand, single leg stance time   Functional Limitation Mobility: Walking and moving around   Mobility: Walking and Moving Around Current Status (([id number] At least 20 percent but less than 40 percent impaired, limited or restricted   Mobility: Walking and Moving  Around Goal Status [phone] At least 20 percent but less than 40 percent impaired, limited or restricted      Problem List There are no active problems to display for this patient. PKerman Passey PT, DPT    5June 12, 2016 2:33 PM  [facility] [address] [address]Phone: [phone]  Fax:  [phone]

## 2014-11-02 ENCOUNTER — Ambulatory Visit: Payer: PPO | Admitting: Physical Therapy

## 2014-11-02 ENCOUNTER — Encounter: Payer: Self-pay | Admitting: Physical Therapy

## 2014-11-02 DIAGNOSIS — R262 Difficulty in walking, not elsewhere classified: Secondary | ICD-10-CM

## 2014-11-02 NOTE — Therapy (Signed)
[place] MAIN [facility] SERVICES [address] [place], [place], [address] Phone: [phone]   Fax:  [phone]  Physical Therapy Treatment  Patient Details  Name: Alyssa Adams MRN: [id number] Date of Birth: [birth year] Referring Provider:  No ref. provider found  Encounter Date: 11/02/2014      PT End of Session - 11/02/14 1419    Visit Number 10   Number of Visits 17   Date for PT Re-Evaluation 11/19/14      Past Medical History  Diagnosis Date  . Depression     Past Surgical History  Procedure Laterality Date  . Gallbladder surgery    . Abdominal hysterectomy      There were no vitals filed for this visit.  Visit Diagnosis:  Difficulty in walking      Subjective Assessment - 11/02/14 1415    Subjective Patient went to the MD today. She is doing her HEP.         Standing hip abduction with red theraband x 10 bilaterally - cuing for keeping foot pointed forwards.  Single leg stance bilaterally x 10 repetitions, maximum holds on RLE < 3 seconds, > 10 seconds on LLE.  Squats with cuing for posture Sit to stand training from chair with rollator walker x 5 repetitions, with cuing for anterior weight shifting. .  Static single leg balance toe touching of cones x 6 bilaterally, cuing for midfoot weight bearing bilaterally to initiate single leg stance and to maintain level shoulders.  Patient has left sided neglect and needs cuing to keep walker away form objects and walls                         PT Education - 11/02/14 1419    Education provided Yes   Person(s) Educated Patient   Methods Tactile cues;Explanation   Comprehension Verbalized understanding;Returned demonstration             PT Long Term Goals - 10/29/14 0856    PT LONG TERM GOAL #1   Title Patient (> 67 years old) will increase single limb stance time to > 14 seconds to reduce fall risk during single limb stance activities by  11/19/2014.   Status On-going   PT LONG TERM GOAL #2   Title Patient will complete a TUG test in < 12 seconds without an AD for independent mobility and decreased fall risk  by 11/19/2014.   Status Partially Met   PT LONG TERM GOAL #3   Title Patient (< 65 years old) will complete five times sit to stand test in < 10 seconds indicating an increased LE strength and improved balance  by 11/19/2014.   Status Partially Met   PT LONG TERM GOAL #4   Title Patient will ambulate at least 400' in 6 minutes to display improved endurance to complete household mobility by 11/19/2014.    Status New               Plan - 11/02/14 1420    Clinical Impression Statement Fatigue with sit to stand but demonstrating more control, Increase weight for standing exercises. Patient will continue to benefit from exercise progression and HEP.    Pt will benefit from skilled therapeutic intervention in order to improve on the following deficits Abnormal gait;Decreased endurance;Difficulty walking;Decreased strength;Decreased mobility;Impaired UE functional use;Decreased balance   Rehab Potential Good   Clinical Impairments Affecting Rehab Potential Patient has residual weakness and postural deficits from  CVA impacting her R side.    PT Frequency 2x / week   PT Duration 8 weeks   PT Treatment/Interventions Gait training;Therapeutic exercise;Therapeutic activities;Neuromuscular re-education   PT Next Visit Plan Increase RLE strengthening, especially with knee flexion, weight shifting, single leg stance.    PT Home Exercise Plan Continue with current HEP    Consulted and Agree with Plan of Care Patient        Problem List There are no active problems to display for this patient.   Arelia Sneddon S 11/02/2014, 2:24 PM  [place] MAIN [facility] SERVICES [address] [place], [place], [address] Phone: [phone]   Fax:  [phone]

## 2014-11-04 ENCOUNTER — Ambulatory Visit: Payer: PPO | Admitting: Physical Therapy

## 2014-11-04 DIAGNOSIS — R262 Difficulty in walking, not elsewhere classified: Secondary | ICD-10-CM | POA: Diagnosis not present

## 2014-11-04 DIAGNOSIS — M6281 Muscle weakness (generalized): Secondary | ICD-10-CM

## 2014-11-04 NOTE — Patient Instructions (Addendum)
All exercises provided were adapted from [employer]. Patient was provided a written handout with pictures as described. Any additional cues were manually entered in to handout and copied in to this document.   SIT TO STAND - NO HANDS (15 times, 1 second hold, 2 sets, 1x per day)  Start by sitting in a chair. Next, raise up to standing without using your hands for support.  **Can hold on to a ball or can of soup. Do 3 sets of 5 as quickly as you can. Repeat x 2   MARCHING WITH WALKER - FWW MARCH (12 times, 1 second hold, 2 sets, 1x per day)  While standing with a walker, raise up one knee allowing it to bend as your raise it.   Be sure to have a chair behind you and under your buttocks for safety.     Hip Abduction Band Work (12 times, 1 second hold, 2 sets, 1x per day)  Standing with band around both ankles Keeping body upright using your core Lift one leg out to side ... With red band.

## 2014-11-04 NOTE — Therapy (Signed)
[place] MAIN [facility] SERVICES [address] [place], [place], [address] Phone: [phone]   Fax:  [phone]  Physical Therapy Treatment  Patient Details  Name: Alyssa Adams MRN: [id number] Date of Birth: 1934-01-26 Referring Provider:  No ref. provider found  Encounter Date: 11/04/2014      PT End of Session - 11/04/14 1402    Visit Number 11   Number of Visits 17   Date for PT Re-Evaluation 11/19/14   PT Start Time 1300   PT Stop Time 1348   PT Time Calculation (min) 48 min   Equipment Utilized During Treatment Gait belt   Activity Tolerance Patient tolerated treatment well   Behavior During Therapy WFL for tasks assessed/performed      Past Medical History  Diagnosis Date  . Depression     Past Surgical History  Procedure Laterality Date  . Gallbladder surgery    . Abdominal hysterectomy      There were no vitals filed for this visit.  Visit Diagnosis:  Difficulty in walking  Muscle weakness (generalized)      Subjective Assessment - 11/04/14 1310    Subjective Patient has been making progress with her HEP and has been diligent with it. Patient struggled with hip flexion marching, though she remained safe. Patient denies any new falls.    Pertinent History Pt had a CVA impacting her R side.    Limitations Walking   Patient Stated Goals To improve her balance and independence with ambulation.    Currently in Pain? No/denies       Gait training Tandem walking in // bars x1, out of parallel bars x 4 for 15'  Agility ladder forwards with right LE and sideways bilaterally x 4 repetitions total  Ambulation with cuing for picking up right foot like she was touching the bench x 20' for 4 repetitions  Neuromuscular re-education  Toe taps to 4" bench 2 x 8 bilaterally, cuing for softer landings.  Red theraband hip abductions x 12 for 2 sets  Hip extensions with red theraband x12 for 2 sets Shooting balls x 12 with  eyes closed and marching on blue foam pad, patient continued to move anteriorly while on foam pad.  Hip flexion marching x 15' for 4 repetitions.  Sit to stand while holding on to small green ball x 5, for 3 sets with cuing to move quickly up, slowly on the way down.  Step ups onto 4" bench x 10 (cuing to make as little noise as possible)                              PT Long Term Goals - 10/29/14 0856    PT LONG TERM GOAL #1   Title Patient (> 79 years old) will increase single limb stance time to > 14 seconds to reduce fall risk during single limb stance activities by 11/19/2014.   Status On-going   PT LONG TERM GOAL #2   Title Patient will complete a TUG test in < 12 seconds without an AD for independent mobility and decreased fall risk  by 11/19/2014.   Status Partially Met   PT LONG TERM GOAL #3   Title Patient (< 79 years old) will complete five times sit to stand test in < 10 seconds indicating an increased LE strength and improved balance  by 11/19/2014.   Status Partially Met   PT LONG TERM GOAL #  4   Title Patient will ambulate at least 400' in 6 minutes to display improved endurance to complete household mobility by 11/19/2014.    Status New               Plan - 11/04/14 1400    Clinical Impression Statement Patient displaying improved sit to stand balance and speed today as she is able to quickly stand without use of her UEs. Patient demonstrating improved RLE knee flexion and single leg balance displayed by minimal loss of balance with toe tapping to 4" box. Patient does not report any new falls, and has noticed significant improvement in her balance. Patient continues to be limited by fatigue, but has make significant progress with standing hip abductions (now able to complete 12 repetitions with red t-band). Skilled PT services are indicated to continue to increase her balance and safety with mobility.    Pt will benefit from skilled therapeutic  intervention in order to improve on the following deficits Abnormal gait;Decreased endurance;Difficulty walking;Decreased strength;Decreased mobility;Impaired UE functional use;Decreased balance   Rehab Potential Good   Clinical Impairments Affecting Rehab Potential Patient has residual weakness and postural deficits from CVA impacting her R side.    PT Frequency 2x / week   PT Duration 8 weeks   PT Treatment/Interventions Gait training;Therapeutic exercise;Therapeutic activities;Neuromuscular re-education   PT Next Visit Plan Progress HEP, increase RLE hip and knee flexion strength. Progress to leg press, higher step, step-ups. Increase aerobic tolerance.    PT Home Exercise Plan See patient instructions.    Recommended Other Services OT for RUE    Consulted and Agree with Plan of Care Patient        Problem List There are no active problems to display for this patient.  Kerman Passey, PT, DPT   11/04/2014, 2:10 PM  [place] MAIN [facility] SERVICES [address] [place], [place], [address] Phone: [phone]   Fax:  [phone]

## 2014-11-11 ENCOUNTER — Encounter: Payer: Self-pay | Admitting: Physical Therapy

## 2014-11-13 ENCOUNTER — Encounter: Payer: Self-pay | Admitting: Physical Therapy

## 2014-11-17 ENCOUNTER — Other Ambulatory Visit: Payer: Self-pay | Admitting: Gastroenterology

## 2014-11-17 DIAGNOSIS — R1013 Epigastric pain: Secondary | ICD-10-CM

## 2014-11-19 ENCOUNTER — Encounter: Payer: Self-pay | Admitting: Physical Therapy

## 2014-11-19 NOTE — Therapy (Signed)
**Note De-Identified Alyssa Obfuscation** [place] MAIN [facility] SERVICES [address] [place], [place], [address] Phone: [phone]   Fax:  [phone]  Patient Details  Name: Alyssa Adams MRN: [id number] Date of Birth: Mar 14, 1934 Referring Provider:  No ref. provider found  Encounter Date: 11/19/2014 PHYSICAL THERAPY DISCHARGE SUMMARY  Visits from Start of Care: see above     PT Long Term Goals - 10/29/14 0856    PT LONG TERM GOAL #1   Title Patient (> 79 years old) will increase single limb stance time to > 14 seconds to reduce fall risk during single limb stance activities by 11/19/2014.   Status On-going   PT LONG TERM GOAL #2   Title Patient will complete a TUG test in < 12 seconds without an AD for independent mobility and decreased fall risk  by 11/19/2014.   Status Partially Met   PT LONG TERM GOAL #3   Title Patient (< 100 years old) will complete five times sit to stand test in < 10 seconds indicating an increased LE strength and improved balance  by 11/19/2014.   Status Partially Met   PT LONG TERM GOAL #4   Title Patient will ambulate at least 400' in 6 minutes to display improved endurance to complete household mobility by 11/19/2014.    Status New     Current functional level related to goals / functional outcomes: See above  Remaining deficits: Falls risk, decreased strength and decreased gait speed with rollator.    Education / Equipment: HEP Plan: Patient agrees to discharge.  Patient goals were partially met. Patient is being discharged due to not returning since the last visit.  ?????      [address], [place], [place], DPT  11/19/2014, 5:13 PM  [place] MAIN [facility] SERVICES [address] [place], [place], [address] Phone: [phone]   Fax:  [phone]

## 2014-11-23 ENCOUNTER — Ambulatory Visit
Admission: RE | Admit: 2014-11-23 | Discharge: 2014-11-23 | Disposition: A | Discharge status: Home / Self Care | Payer: PPO | Source: Ambulatory Visit | Attending: Gastroenterology | Admitting: Gastroenterology

## 2014-11-23 DIAGNOSIS — R1013 Epigastric pain: Secondary | ICD-10-CM | POA: Insufficient documentation

## 2014-11-23 DIAGNOSIS — N281 Cyst of kidney, acquired: Secondary | ICD-10-CM | POA: Insufficient documentation

## 2015-09-09 DIAGNOSIS — S92511A Displaced fracture of proximal phalanx of right lesser toe(s), initial encounter for closed fracture: Secondary | ICD-10-CM | POA: Diagnosis not present

## 2015-09-09 DIAGNOSIS — M79671 Pain in right foot: Secondary | ICD-10-CM | POA: Diagnosis not present

## 2016-01-18 DIAGNOSIS — H353132 Nonexudative age-related macular degeneration, bilateral, intermediate dry stage: Secondary | ICD-10-CM | POA: Diagnosis not present

## 2016-02-09 DIAGNOSIS — I679 Cerebrovascular disease, unspecified: Secondary | ICD-10-CM | POA: Diagnosis not present

## 2016-02-09 DIAGNOSIS — F329 Major depressive disorder, single episode, unspecified: Secondary | ICD-10-CM | POA: Diagnosis not present

## 2016-02-09 DIAGNOSIS — I10 Essential (primary) hypertension: Secondary | ICD-10-CM | POA: Diagnosis not present

## 2016-02-09 DIAGNOSIS — E538 Deficiency of other specified B group vitamins: Secondary | ICD-10-CM | POA: Diagnosis not present

## 2016-02-09 DIAGNOSIS — R29898 Other symptoms and signs involving the musculoskeletal system: Secondary | ICD-10-CM | POA: Diagnosis not present

## 2016-02-09 DIAGNOSIS — R32 Unspecified urinary incontinence: Secondary | ICD-10-CM | POA: Diagnosis not present

## 2016-03-03 DIAGNOSIS — H353221 Exudative age-related macular degeneration, left eye, with active choroidal neovascularization: Secondary | ICD-10-CM | POA: Diagnosis not present

## 2016-03-06 ENCOUNTER — Ambulatory Visit: Payer: Self-pay

## 2016-03-13 ENCOUNTER — Ambulatory Visit: Payer: PPO | Attending: Family Medicine

## 2016-03-13 VITALS — BP 157/67 | HR 68

## 2016-03-13 DIAGNOSIS — R262 Difficulty in walking, not elsewhere classified: Secondary | ICD-10-CM | POA: Diagnosis not present

## 2016-03-13 DIAGNOSIS — M6281 Muscle weakness (generalized): Secondary | ICD-10-CM

## 2016-03-13 NOTE — Addendum Note (Signed)
Addended by: Bethanie DickerISSELL, [place] V on: 03/13/2016 03:31 PM   Modules accepted: Orders

## 2016-03-13 NOTE — Therapy (Signed)
[place] [facility] [address] [place], [place], [address] Phone: [phone]   Fax:  [phone]  Physical Therapy Evaluation  Patient Details  Name: Larita FifeJanie M Uppal MRN: [id number] Date of Birth: 04/15/1934 Referring Provider: Jerl MinaJames Hedrick MD  Encounter Date: 03/13/2016      PT End of Session - 03/13/16 1430    Visit Number 1   Number of Visits 12   Authorization Type 1/10 G code   PT Start Time 1300   PT Stop Time 1400   PT Time Calculation (min) 60 min   Equipment Utilized During Treatment Gait belt   Activity Tolerance Patient tolerated treatment well;No increased pain   Behavior During Therapy WFL for tasks assessed/performed      Past Medical History:  Diagnosis Date  . Depression     Past Surgical History:  Procedure Laterality Date  . ABDOMINAL HYSTERECTOMY    . GALLBLADDER SURGERY      Vitals:   03/13/16 1308  BP: (!) 157/67  Pulse: 68  SpO2: 98%         Subjective Assessment - 03/13/16 1317    Subjective Patient states difficulty with walking, balancing, standing, transfering from sitting to standing, ascending and descending the stairs,  transfering out of the shower, and bed mobility. Patient states she's able to stand but requires UE support to perform. Patient states she ambulates with a rollator at home and in community distances. Patient reports she ambulates at community distances with rollator with increased fatigue after walking ~ 15 min. Patient reports she has grab bars installed throughout the house and requires UE support to ambulate and transfer from sitting to standing. Reports she feels has difficulties when ambulating and fatigues quickly.     Pertinent History  CVA in 2006; Received PT in May 2015, progressive weakness since discharge secondary to not performing HEP and decreased activity   Limitations Lifting;Walking;Standing   How long can you stand comfortably? 20min   How long  can you walk comfortably? 15 min   Patient Stated Goals Increase balance, walking, going where she wants to travel   Currently in Pain? No/denies            [facility] PT Assessment - 03/13/16 1310      Assessment   Medical Diagnosis CVD B LE Weakness    Referring Provider Jerl MinaJames Hedrick MD   Onset Date/Surgical Date 03/13/15   Hand Dominance Right   Next MD Visit unknown   Prior Therapy 10/11/2014     Precautions   Precautions None     Restrictions   Weight Bearing Restrictions No     Balance Screen   Has the patient fallen in the past 6 months No   Has the patient had a decrease in activity level because of a fear of falling?  Yes   Is the patient reluctant to leave their home because of a fear of falling?  No     Home Environment   Living Environment Private residence   Living Arrangements Spouse/significant other   Available Help at Discharge Family   Home Access Stairs to enter   Entrance Stairs-Number of Steps 1   Entrance Stairs-Rails Can reach both   Home Layout Two level   Alternate Level Stairs-Number of Steps 13   Alternate Level Stairs-Rails Can reach both   Home Equipment Walker - 2 wheels;Shower seat;Grab bars - toilet;Grab bars - tub/shower;Wheelchair - manual     Prior Function  Level of Independence Independent with household mobility with device   Vocation Retired   [employer] N/A   Leisure Playing cards, having fun with friends     ROM / Strength   AROM / PROM / Counselling psychologist   Strength Assessment Site Hip;Knee;Ankle   Right/Left Hip Right;Left   Right Hip Flexion 4-/5   Right Hip ABduction 4-/5   Right Hip ADduction 4-/5   Left Hip Flexion 4-/5   Left Hip ABduction 4-/5   Left Hip ADduction 4-/5   Right/Left Knee Right;Left   Right Knee Flexion 4/5   Right Knee Extension 3+/5   Left Knee Flexion 4/5   Left Knee Extension 3+/5   Right/Left Ankle Right;Left   Right Ankle Dorsiflexion 4/5   Left Ankle  Dorsiflexion 3+/5     Bed Mobility   Bed Mobility Rolling Right;Rolling Left;Right Sidelying to Sit;Left Sidelying to Sit;Supine to Sit;Sit to Supine   Rolling Right 6: Modified independent (Device/Increase time)   Rolling Left 7: Independent   Right Sidelying to Sit 6: Modified independent (Device/Increase time)   Left Sidelying to Sit 6: Modified independent (Device/Increase time)   Supine to Sit 6: Modified independent (Device/Increase time)   Sit to Supine 7: Independent     Transfers   Transfers Sit to Stand;Stand to Sit   Sit to Stand 7: Independent   Five time sit to stand comments  21.5 sec     Ambulation/Gait   Ambulation/Gait Yes   Ambulation/Gait Assistance 6: Modified independent (Device/Increase time)   Ambulation Distance (Feet) 60 Feet   Assistive device Rollator   Gait Pattern Step-to pattern   Gait velocity .22 m/s   Stairs No   Gait Comments Step to gait pattern stepping right to left. Short stride length bilaterally, decreased arm swing bilaterally, decreased hip rotation     Standardized Balance Assessment   Five times sit to stand comments  21.6sec   10 Meter Walk .22 m/s     Timed Up and Go Test   Normal TUG (seconds) 70     Observation: Forward head posture and forward rounded shoulders  Treatment: Therapeutic Exercise: Seated clamshells YTB -- x15 Ball squeeze/glute squeeze -- x15 Sit to stand -- x10         PT Education - 03/13/16 1428    Education provided Yes   Education Details Added to HEP: Sit to stands, seated clamshells with YTB, ball squeeze/ glute squeeze   Person(s) Educated Patient   Methods Explanation;Demonstration;Tactile cues;Verbal cues   Comprehension Verbalized understanding;Returned demonstration             PT Long Term Goals - 03/13/16 1443      PT LONG TERM GOAL #1   Title Patient will score a 30/80 on the LEFS to demonstrate significant improvement in LE function and ability to perform squatting activities    Baseline LEFS: 11/80   Status New     PT LONG TERM GOAL #2   Title Patient will complete a TUG test in < 20 seconds without an AD for independent mobility and decreased fall risk.   Baseline TUG: 70sec   Status New     PT LONG TERM GOAL #3   Title Patient will complete five times sit to stand test in < 16 seconds indicating an increased LE strength and improved balance.   Baseline 5xSTS: 21.5sec   Status New     PT LONG TERM GOAL #4   Title  Patient will improve gait speed to >.8 m/s to demonstrate significant improvement in dynamic balance and decreased in fall risk.    Baseline gait speed: .269m/s   Status New               Plan - 2016/03/17 1430    Clinical Impression Statement Pt is a 81 right dominant female experiencing generalized weakness and balance difficulties secondary to generalized weakness and inactivity. Patient demonstrates increased fall risk and decreased LE function as indicated by poor scores with the TUG, 5xSTS, LEFS, and 32m walk. Patient also demonstrates decreased muscular strength as indicated by MMT and patient will benefit from further skilled therapy to improve current impairments to return to prior level of function.    Rehab Potential Fair   Clinical Impairments Affecting Rehab Potential (+) highly motivated, family support (-) age, chronicity of weakness   PT Frequency 2x / week   PT Duration 6 weeks   PT Treatment/Interventions Passive range of motion;Manual techniques;Electrical Stimulation;Cryotherapy;Balance training;Therapeutic exercise;Therapeutic activities;Stair training;Neuromuscular re-education;Patient/family education   PT Next Visit Plan Muscular strengthening, endurance, standing balance, gait training   PT Home Exercise Plan seated clamshells YTB, Sit to stand, ball squeeze/glute squeeze   Consulted and Agree with Plan of Care Patient      Patient will benefit from skilled therapeutic intervention in order to improve the following  deficits and impairments:  Decreased balance, Difficulty walking, Decreased safety awareness, Decreased endurance, Decreased strength, Hypomobility, Impaired UE functional use, Decreased activity tolerance, Decreased mobility, Postural dysfunction, Abnormal gait, Decreased coordination  Visit Diagnosis: Difficulty in walking  Muscle weakness (generalized)      G-Codes - 03/17/16 1453    Functional Assessment Tool Used TUG, 5xSTS, 22m walk, clinical judgement   Functional Limitation Mobility: Walking and moving around   Mobility: Walking and Moving Around Current Status [phone]) At least 60 percent but less than 80 percent impaired, limited or restricted   Mobility: Walking and Moving Around Goal Status [phone]) At least 20 percent but less than 40 percent impaired, limited or restricted       Problem List There are no active problems to display for this patient.   [name], PT DPT 03-17-16, 3:02 PM  [place] [facility] MAIN [facility] SERVICES [address] [place], [place], [address] Phone: [phone]   Fax:  [phone]  Name: DORRIE COCUZZA MRN: [id number] Date of Birth: 13-Apr-1934

## 2016-03-15 ENCOUNTER — Ambulatory Visit: Payer: PPO

## 2016-03-15 DIAGNOSIS — M6281 Muscle weakness (generalized): Secondary | ICD-10-CM

## 2016-03-15 DIAGNOSIS — R262 Difficulty in walking, not elsewhere classified: Secondary | ICD-10-CM

## 2016-03-15 NOTE — Therapy (Signed)
[place] [facility] [address] [place], [place], [address] Phone: [phone]   Fax:  [phone]  Physical Therapy Treatment  Patient Details  Name: Alyssa Adams MRN: [id number] Date of Birth: 02/18/1934 Referring Provider: Jerl MinaJames Hedrick MD  Encounter Date: 03/15/2016      PT End of Session - 03/15/16 1530    Visit Number 2   Number of Visits 12   Date for PT Re-Evaluation 04/24/16   Authorization Type 2/10 G code   PT Start Time 1434   PT Stop Time 1515   PT Time Calculation (min) 41 min   Equipment Utilized During Treatment Gait belt   Activity Tolerance Patient tolerated treatment well;No increased pain   Behavior During Therapy WFL for tasks assessed/performed      Past Medical History:  Diagnosis Date  . Depression     Past Surgical History:  Procedure Laterality Date  . ABDOMINAL HYSTERECTOMY    . GALLBLADDER SURGERY      There were no vitals filed for this visit.      Subjective Assessment - 03/15/16 1442    Subjective Patient reports increased fatigue upon entering the clinic today secondary to doing laundry and walking around the house today.    Pertinent History  CVA in 2006; Received PT in May 2015, progressive weakness since discharge secondary to not performing HEP and decreased activity   Limitations Lifting;Walking;Standing   How long can you stand comfortably? 20min   How long can you walk comfortably? 15 min   Patient Stated Goals Increase balance, walking, going where she wants to travel      Treatment: Therapeutic Exercise: Seated clamshells RTB -- 2x15 Seated Marches with RTB around knees for resistance - 2 x 15 Ball squeeze/glute squeeze -- 2 x15 LAQ in sitting - 2 x 10 3# Hamstring curls in sitting - with YTB 2 x 20 Bridges in supine - 2 x 10 Marches in supine - 2 x10  Sit to stands from heightened mat with emphasis on glute activation -- 2x10 * Patient requires cueing on LE  positioning during exercises       PT Education - 03/15/16 1530    Education provided Yes   Education Details HEP: Bridges in supine, cueing for technique for sit to stands   Person(s) Educated Patient   Methods Explanation;Demonstration   Comprehension Verbalized understanding;Returned demonstration             PT Long Term Goals - 03/13/16 1443      PT LONG TERM GOAL #1   Title Patient will score a 30/80 on the LEFS to demonstrate significant improvement in LE function and ability to perform squatting activities   Baseline LEFS: 11/80   Status New     PT LONG TERM GOAL #2   Title Patient will complete a TUG test in < 20 seconds without an AD for independent mobility and decreased fall risk.   Baseline TUG: 70sec   Status New     PT LONG TERM GOAL #3   Title Patient will complete five times sit to stand test in < 16 seconds indicating an increased LE strength and improved balance.   Baseline 5xSTS: 21.5sec   Status New     PT LONG TERM GOAL #4   Title Patient will improve gait speed to >.8 m/s to demonstrate significant improvement in dynamic balance and decreased in fall risk.    Baseline gait speed: .2450m/s   Status New  Plan - 03/15/16 1532    Clinical Impression Statement Performed decreased weight bearing exercises today secondary to initial fatigue upon entering the clinic. Pt demonstrates increased fatigue when performing exercises today and requires frequent rest breaks between sets indicating decreased muscular strength and endurance. Pt will benefit from further skilled therapy focused on improving current impairments to return to prior level of function.     Rehab Potential Fair   Clinical Impairments Affecting Rehab Potential (+) highly motivated, family support (-) age, chronicity of weakness   PT Frequency 2x / week   PT Duration 6 weeks   PT Treatment/Interventions Passive range of motion;Manual techniques;Electrical  Stimulation;Cryotherapy;Balance training;Therapeutic exercise;Therapeutic activities;Stair training;Neuromuscular re-education;Patient/family education   PT Next Visit Plan Muscular strengthening, endurance, standing balance, gait training   PT Home Exercise Plan seated clamshells YTB, Sit to stand, ball squeeze/glute squeeze   Consulted and Agree with Plan of Care Patient      Patient will benefit from skilled therapeutic intervention in order to improve the following deficits and impairments:  Decreased balance, Difficulty walking, Decreased safety awareness, Decreased endurance, Decreased strength, Hypomobility, Impaired UE functional use, Decreased activity tolerance, Decreased mobility, Postural dysfunction, Abnormal gait, Decreased coordination  Visit Diagnosis: Difficulty in walking  Muscle weakness (generalized)     Problem List There are no active problems to display for this patient.   [name], PT DPT 03/15/2016, 3:37 PM   [facility] MAIN [facility] SERVICES [address] [place], [place], [address] Phone: [phone]   Fax:  [phone]  Name: Alyssa Adams MRN: [id number] Date of Birth: Nov 22, 1933

## 2016-03-17 ENCOUNTER — Encounter: Payer: Self-pay | Admitting: Urology

## 2016-03-17 ENCOUNTER — Ambulatory Visit (INDEPENDENT_AMBULATORY_CARE_PROVIDER_SITE_OTHER): Payer: PPO | Admitting: Urology

## 2016-03-17 VITALS — BP 144/76 | HR 68 | Ht 63.0 in | Wt 152.3 lb

## 2016-03-17 DIAGNOSIS — N3946 Mixed incontinence: Secondary | ICD-10-CM

## 2016-03-17 MED ORDER — MIRABEGRON ER 50 MG PO TB24: 50.0000 mg | ORAL_TABLET | Freq: Every day | ORAL | 11 refills | Status: DC

## 2016-03-17 NOTE — Progress Notes (Signed)
03/17/2016 3:21 PM   Alyssa Adams 11/25/1933 [id number]  Referring provider: Jerl MinaJames Hedrick, MD [address] [facility] [place], [place] [address]  Chief Complaint  Patient presents with  . New Patient (Initial Visit)    Urinary Incontinence    HPI: The patient is a 82 year old female with a past medical history significant for a stoke ten years ago presents for evaluation of urinary incontinence.  She has had problems with urinary incontinence after over 5 years. She has to wear depends. She is constantly in urine. She has a hard time describing times when it does leak. She does note that it sometimes happens when she coughs or sneezes. She does say that she sometimes will also does leak. At some of this time she will have some urge. She has had a stroke and has had deficits on her right side as a result of it.  PVR: 0 cc  PMH: Past Medical History:  Diagnosis Date  . Depression   . GERD (gastroesophageal reflux disease)   . Hypertension   . Stroke (cerebrum) [facility](HCC)     Surgical History: Past Surgical History:  Procedure Laterality Date  . ABDOMINAL HYSTERECTOMY    . GALLBLADDER SURGERY      Home Medications:    Medication List       Accurate as of 03/17/16  3:21 PM. Always use your most recent med list.          amLODipine 5 MG tablet Commonly known as:  NORVASC TAKE 1 TABLET EVERY DAY   amLODipine-atorvastatin 5-10 MG tablet Commonly known as:  CADUET Take 1 tablet by mouth daily.   cholestyramine light 4 g packet Commonly known as:  PREVALITE TAKE 1 PACKET BY MOUTH EVERY MORNING BEFORE BREAKFAST   dipyridamole-aspirin 200-25 MG 12hr capsule Commonly known as:  AGGRENOX TAKE ONE CAPSULE TWICE A DAY   metoprolol succinate 50 MG 24 hr tablet Commonly known as:  TOPROL-XL TAKE 1 TABLET EVERY DAY   pantoprazole 40 MG tablet Commonly known as:  PROTONIX TAKE 1 TABLET EVERY DAY   sucralfate 1 g tablet Commonly known as:  CARAFATE Take  1 g by mouth 4 (four) times daily -  with meals and at bedtime.   venlafaxine XR 150 MG 24 hr capsule Commonly known as:  EFFEXOR-XR TAKE ONE CAPSULE DAILY   Vitamin B-12 CR 1000 MCG Tbcr Take by mouth.   Vitamin D (Ergocalciferol) 50000 units Caps capsule Commonly known as:  DRISDOL TAKE ONE CAPSULE TWICE A WEEK       Allergies:  Allergies  Allergen Reactions  . Erythromycin Ethylsuccinate     Other reaction(s): Unknown  . Sulfa Antibiotics     Other reaction(s): Unknown Other reaction(s): Unknown    Family History: Family History  Problem Relation Age of Onset  . Kidney cancer Father   . Prostate cancer Neg Hx     Social History:  reports that she has never smoked. She has never used smokeless tobacco. She reports that she does not drink alcohol. Her drug history is not on file.  ROS: UROLOGY Frequent Urination?: Yes Hard to postpone urination?: Yes Burning/pain with urination?: No Get up at night to urinate?: Yes Leakage of urine?: Yes Urine stream starts and stops?: No Trouble starting stream?: No Do you have to strain to urinate?: No Blood in urine?: No Urinary tract infection?: No Sexually transmitted disease?: No Injury to kidneys or bladder?: No Painful intercourse?: No Weak stream?: No Currently pregnant?:  No Vaginal bleeding?: No Last menstrual period?: n  Gastrointestinal Nausea?: No Vomiting?: No Indigestion/heartburn?: No Diarrhea?: Yes Constipation?: No  Constitutional Fever: No Night sweats?: No Weight loss?: No Fatigue?: No  Skin Skin rash/lesions?: No Itching?: No  Eyes Blurred vision?: No Double vision?: No  Ears/Nose/Throat Sore throat?: No Sinus problems?: No  Hematologic/Lymphatic Swollen glands?: No Easy bruising?: Yes  Cardiovascular Leg swelling?: No Chest pain?: No  Respiratory Cough?: No Shortness of breath?: No  Endocrine Excessive thirst?: No  Musculoskeletal Back pain?: No Joint pain?:  No  Neurological Headaches?: No Dizziness?: No  Psychologic Depression?: No Anxiety?: No  Physical Exam: BP (!) 144/76   Pulse 68   Ht 5\' 3"  (1.6 m)   Wt 152 lb 4.8 oz (69.1 kg)   BMI 26.98 kg/m   Constitutional:  Alert and oriented, No acute distress. HEENT: [place] AT, moist mucus membranes.  Trachea midline, no masses. Cardiovascular: No clubbing, cyanosis, or edema. Respiratory: Normal respiratory effort, no increased work of breathing. GI: Abdomen is soft, nontender, nondistended, no abdominal masses GU: No CVA tenderness.  Skin: No rashes, bruises or suspicious lesions. Lymph: No cervical or inguinal adenopathy. Neurologic: Grossly intact, no focal deficits, moving all 4 extremities. Psychiatric: Normal mood and affect.  Laboratory Data: Lab Results  Component Value Date   WBC 8.0 11/22/2013   HGB 13.3 11/22/2013   HCT 39.2 11/22/2013   MCV 96 11/22/2013   PLT 274 11/22/2013    Lab Results  Component Value Date   CREATININE 0.85 11/22/2013    No results found for: PSA  No results found for: TESTOSTERONE  No results found for: HGBA1C  Urinalysis    Component Value Date/Time   COLORURINE Yellow 11/19/2013 1754   APPEARANCEUR Clear 11/19/2013 1754   LABSPEC 1.018 11/19/2013 1754   PHURINE 6.0 11/19/2013 1754   GLUCOSEU Negative 11/19/2013 1754   HGBUR 1+ 11/19/2013 1754   BILIRUBINUR Negative 11/19/2013 1754   KETONESUR 1+ 11/19/2013 1754   PROTEINUR 30 mg/dL 40/98/1191 4782   NITRITE Negative 11/19/2013 1754   LEUKOCYTESUR Negative 11/19/2013 1754     Assessment & Plan:   1. Mixed urinary incontinence A long talk with the patient regarding possible etiologies of her incontinence. These include stress urinary incontinence as well as urge incontinence. I feel she has some component of both of these. Surgical repair is the only way to correct stress urinary incontinence. We discussed that given her stroke history as well as her age but she was not a  good candidate for this procedure. We decided to start her on medications for urge incontinence to see if this helps minimize her symptoms. She was given samples and a prescription for Myrbetriq 50 mg daily. She'll follow-up in 3 months to discuss her progress.  Return in about 3 months (around 06/17/2016).  Hildred Laser, MD  [facility] [address] [place], [place] [address] [phone]

## 2016-03-20 ENCOUNTER — Ambulatory Visit: Payer: PPO

## 2016-03-20 DIAGNOSIS — M6281 Muscle weakness (generalized): Secondary | ICD-10-CM

## 2016-03-20 DIAGNOSIS — R262 Difficulty in walking, not elsewhere classified: Secondary | ICD-10-CM | POA: Diagnosis not present

## 2016-03-20 NOTE — Therapy (Signed)
[place] [facility] [address] [place], [place], [address] Phone: [phone]   Fax:  [phone]  Physical Therapy Treatment  Patient Details  Name: Alyssa FifeJanie M Adams MRN: [id number] Date of Birth: 12/10/1933 Referring Provider: Jerl MinaJames Hedrick MD  Encounter Date: 03/20/2016      PT End of Session - 03/20/16 1353    Visit Number 3   Number of Visits 12   Date for PT Re-Evaluation 04/24/16   Authorization Type 3/10 G code   PT Start Time 1301   PT Stop Time 1347   PT Time Calculation (min) 46 min   Equipment Utilized During Treatment Gait belt   Activity Tolerance Patient tolerated treatment well;No increased pain   Behavior During Therapy WFL for tasks assessed/performed      Past Medical History:  Diagnosis Date  . Depression   . GERD (gastroesophageal reflux disease)   . Hypertension   . Stroke (cerebrum) [facility](HCC)     Past Surgical History:  Procedure Laterality Date  . ABDOMINAL HYSTERECTOMY    . GALLBLADDER SURGERY      There were no vitals filed for this visit.      Subjective Assessment - 03/20/16 1307    Subjective Patient reports increased fatigue last Friday and states she did not often perform HEP. Patient states increased fatigue today.    Pertinent History  CVA in 2006; Received PT in May 2015, progressive weakness since discharge secondary to not performing HEP and decreased activity   Limitations Lifting;Walking;Standing   How long can you stand comfortably? 20min   How long can you walk comfortably? 15 min   Patient Stated Goals Increase balance, walking, going where she wants to travel   Currently in Pain? No/denies       Treatment: Therapeutic Exercise: Nustep - x195min level 1 at seat level 6.  Sit to stands from chair with emphasis on glute activation -- 2x10 Weight shifting in standing - x 10 bilaterally  Marching in standing - 2 x 12 bilaterally  Seated clamshells RTB -- 2x15 LAQ in  sitting - x 20 3#, x20 5# Side stepping in  bars - down and back x4 (~8 ft each way) Leg press -- 75# 2 x 10 with cueing on leg and feet position * Patient requires cueing on LE positioning during exercises          PT Education - 03/20/16 1348    Education provided Yes   Education Details Cueing on foot and hip positioning during exercise   Person(s) Educated Patient   Methods Explanation;Demonstration   Comprehension Verbalized understanding;Returned demonstration             PT Long Term Goals - 03/13/16 1443      PT LONG TERM GOAL #1   Title Patient will score a 30/80 on the LEFS to demonstrate significant improvement in LE function and ability to perform squatting activities   Baseline LEFS: 11/80   Status New     PT LONG TERM GOAL #2   Title Patient will complete a TUG test in < 20 seconds without an AD for independent mobility and decreased fall risk.   Baseline TUG: 70sec   Status New     PT LONG TERM GOAL #3   Title Patient will complete five times sit to stand test in < 16 seconds indicating an increased LE strength and improved balance.   Baseline 5xSTS: 21.5sec   Status New     PT  LONG TERM GOAL #4   Title Patient will improve gait speed to >.8 m/s to demonstrate significant improvement in dynamic balance and decreased in fall risk.    Baseline gait speed: .246m/s   Status New               Plan - 03/20/16 1354    Clinical Impression Statement Patient tolerates advancement in exercise progression with ability to tolerate improved weight bearing activities without increase in fatigue. Although patient is improving, she continues to demonstrate decreased strength and difficulty in walking and will benefit from further skilled therapy to decrease fall risk.    Rehab Potential Fair   Clinical Impairments Affecting Rehab Potential (+) highly motivated, family support (-) age, chronicity of weakness   PT Frequency 2x / week   PT Duration 6 weeks    PT Treatment/Interventions Passive range of motion;Manual techniques;Electrical Stimulation;Cryotherapy;Balance training;Therapeutic exercise;Therapeutic activities;Stair training;Neuromuscular re-education;Patient/family education   PT Next Visit Plan Muscular strengthening, endurance, standing balance, gait training   PT Home Exercise Plan seated clamshells YTB, Sit to stand, ball squeeze/glute squeeze   Consulted and Agree with Plan of Care Patient      Patient will benefit from skilled therapeutic intervention in order to improve the following deficits and impairments:  Decreased balance, Difficulty walking, Decreased safety awareness, Decreased endurance, Decreased strength, Hypomobility, Impaired UE functional use, Decreased activity tolerance, Decreased mobility, Postural dysfunction, Abnormal gait, Decreased coordination  Visit Diagnosis: Difficulty in walking  Muscle weakness (generalized)     Problem List There are no active problems to display for this patient.   [name], PT DPT 03/20/2016, 1:57 PM  [place] [facility] MAIN [facility] SERVICES [address] [place], [place], [address] Phone: [phone]   Fax:  [phone]  Name: Alyssa Adams MRN: [id number] Date of Birth: 06/02/1934

## 2016-03-23 ENCOUNTER — Ambulatory Visit: Payer: PPO

## 2016-03-23 DIAGNOSIS — R262 Difficulty in walking, not elsewhere classified: Secondary | ICD-10-CM

## 2016-03-23 DIAGNOSIS — M6281 Muscle weakness (generalized): Secondary | ICD-10-CM

## 2016-03-23 NOTE — Therapy (Signed)
[place] [facility] [address] [place], [place], [address] Phone: [phone]   Fax:  [phone]  Physical Therapy Treatment  Patient Details  Name: Alyssa Adams MRN: [id number] Date of Birth: 08/25/1933 Referring Provider: Jerl MinaJames Hedrick MD  Encounter Date: 03/23/2016      PT End of Session - 03/23/16 1544    Visit Number 4   Number of Visits 12   Date for PT Re-Evaluation 04/24/16   Authorization Type 4/10 G code   PT Start Time 1520   PT Stop Time 1600   PT Time Calculation (min) 40 min   Equipment Utilized During Treatment Gait belt   Activity Tolerance Patient tolerated treatment well;No increased pain   Behavior During Therapy WFL for tasks assessed/performed      Past Medical History:  Diagnosis Date  . Depression   . GERD (gastroesophageal reflux disease)   . Hypertension   . Stroke (cerebrum) [facility](HCC)     Past Surgical History:  Procedure Laterality Date  . ABDOMINAL HYSTERECTOMY    . GALLBLADDER SURGERY      There were no vitals filed for this visit.      Subjective Assessment - 03/23/16 1523    Subjective Patient reports she's trying to walk more around the house and states she's been performing her HEP.    Pertinent History  CVA in 2006; Received PT in May 2015, progressive weakness since discharge secondary to not performing HEP and decreased activity   Limitations Lifting;Walking;Standing   How long can you stand comfortably? 20min   How long can you walk comfortably? 15 min   Patient Stated Goals Increase balance, walking, going where she wants to travel   Currently in Pain? No/denies      Treatment: Therapeutic Exercise: Sit to stands from chair with emphasis on glute activation -- 2x13 Seated ball squeeze/glute squeeze - 3 x 10 Marching in standing - 2 x 12 bilaterally  LAQ in sitting - 3 x 10 10#  Side stepping in  bars - down and back x4 (~8 ft each way) Leg press -- 90# 2 x 15  with cueing on leg and feet position Backwards amb in  bars - down and back x4 (~538ft each way) * Patient requires cueing on LE positioning during exercises          PT Education - 03/23/16 1543    Education provided Yes   Education Details Cueing on knee and hip positioning during exercise   Person(s) Educated Patient   Methods Explanation;Demonstration   Comprehension Verbalized understanding;Returned demonstration             PT Long Term Goals - 03/13/16 1443      PT LONG TERM GOAL #1   Title Patient will score a 30/80 on the LEFS to demonstrate significant improvement in LE function and ability to perform squatting activities   Baseline LEFS: 11/80   Status New     PT LONG TERM GOAL #2   Title Patient will complete a TUG test in < 20 seconds without an AD for independent mobility and decreased fall risk.   Baseline TUG: 70sec   Status New     PT LONG TERM GOAL #3   Title Patient will complete five times sit to stand test in < 16 seconds indicating an increased LE strength and improved balance.   Baseline 5xSTS: 21.5sec   Status New     PT LONG TERM GOAL #4  Title Patient will improve gait speed to >.8 m/s to demonstrate significant improvement in dynamic balance and decreased in fall risk.    Baseline gait speed: .254m/s   Status New               Plan - 03/23/16 1545    Clinical Impression Statement Patient demonstrates improved muscular strength and endurance compared to previous visits indicating functional carryover between visits. Although patient is improving she continues to experience decreased muscular strength and endurance with exercise and will benefit from further skilled therapy to return to prior level of function.   Rehab Potential Fair   Clinical Impairments Affecting Rehab Potential (+) highly motivated, family support (-) age, chronicity of weakness   PT Frequency 2x / week   PT Duration 6 weeks   PT Treatment/Interventions  Passive range of motion;Manual techniques;Electrical Stimulation;Cryotherapy;Balance training;Therapeutic exercise;Therapeutic activities;Stair training;Neuromuscular re-education;Patient/family education   PT Next Visit Plan Muscular strengthening, endurance, standing balance, gait training   PT Home Exercise Plan seated clamshells YTB, Sit to stand, ball squeeze/glute squeeze   Consulted and Agree with Plan of Care Patient      Patient will benefit from skilled therapeutic intervention in order to improve the following deficits and impairments:  Decreased balance, Difficulty walking, Decreased safety awareness, Decreased endurance, Decreased strength, Hypomobility, Impaired UE functional use, Decreased activity tolerance, Decreased mobility, Postural dysfunction, Abnormal gait, Decreased coordination  Visit Diagnosis: Difficulty in walking  Muscle weakness (generalized)     Problem List There are no active problems to display for this patient.   [name], PT DPT 03/23/2016, 6:02 PM  [place] [facility] MAIN [facility] SERVICES [address] [place], [place], [address] Phone: [phone]   Fax:  [phone]  Name: Alyssa Adams MRN: [id number] Date of Birth: Jul 30, 1933

## 2016-03-27 ENCOUNTER — Ambulatory Visit: Payer: PPO

## 2016-03-27 DIAGNOSIS — R262 Difficulty in walking, not elsewhere classified: Secondary | ICD-10-CM

## 2016-03-27 DIAGNOSIS — M6281 Muscle weakness (generalized): Secondary | ICD-10-CM

## 2016-03-27 NOTE — Therapy (Signed)
[place] [facility] [address] [place], [place], [address] Phone: [phone]   Fax:  [phone]  Physical Therapy Treatment  Patient Details  Name: Alyssa Adams MRN: [id number] Date of Birth: 06/05/1934 Referring Provider: Jerl MinaJames Hedrick MD  Encounter Date: 03/27/2016      PT End of Session - 03/27/16 1433    Visit Number 5   Number of Visits 12   Date for PT Re-Evaluation 04/24/16   Authorization Type 5/10 G code   PT Start Time 1347   PT Stop Time 1430   PT Time Calculation (min) 43 min   Equipment Utilized During Treatment Gait belt   Activity Tolerance Patient tolerated treatment well;No increased pain   Behavior During Therapy WFL for tasks assessed/performed      Past Medical History:  Diagnosis Date  . Depression   . GERD (gastroesophageal reflux disease)   . Hypertension   . Stroke (cerebrum) [facility](HCC)     Past Surgical History:  Procedure Laterality Date  . ABDOMINAL HYSTERECTOMY    . GALLBLADDER SURGERY      There were no vitals filed for this visit.      Subjective Assessment - 03/27/16 1349    Subjective Patient states sh's been having difficulty with ambulating throughout the house. Reports increased fatigue and weakness in her R leg. Reports increased sorenes sin her low back.    Pertinent History  CVA in 2006; Received PT in May 2015, progressive weakness since discharge secondary to not performing HEP and decreased activity   Limitations Lifting;Walking;Standing   How long can you stand comfortably? 20min   How long can you walk comfortably? 15 min   Patient Stated Goals Increase balance, walking, going where she wants to travel      Treatment: Therapeutic Exercise: Ambulation - 7080ft with quad cane with cueing on steppage pattern  Sit to stands from chair with emphasis on glute activation -- 2x15 Seated hip abduction in chair - 2 x 15 Bridges in hooklying - 2 x 15 SLR in supine - 2 x  8 Marching in standing - 2 x 12 bilaterally  Side stepping in  bars - down and back x4 (~8 ft each way) * Patient requires cueing on LE positioning during exercises       PT Education - 03/27/16 1407    Education provided Yes   Education Details Cued on knee position when performing exercise   Person(s) Educated Patient   Methods Explanation;Demonstration   Comprehension Verbalized understanding;Returned demonstration             PT Long Term Goals - 03/13/16 1443      PT LONG TERM GOAL #1   Title Patient will score a 30/80 on the LEFS to demonstrate significant improvement in LE function and ability to perform squatting activities   Baseline LEFS: 11/80   Status New     PT LONG TERM GOAL #2   Title Patient will complete a TUG test in < 20 seconds without an AD for independent mobility and decreased fall risk.   Baseline TUG: 70sec   Status New     PT LONG TERM GOAL #3   Title Patient will complete five times sit to stand test in < 16 seconds indicating an increased LE strength and improved balance.   Baseline 5xSTS: 21.5sec   Status New     PT LONG TERM GOAL #4   Title Patient will improve gait speed to >.8 m/s  to demonstrate significant improvement in dynamic balance and decreased in fall risk.    Baseline gait speed: .23m/s   Status New               Plan - 03/27/16 1434    Clinical Impression Statement Patient demonstrates improved muscular strength with exercise today and was able to amb with 4 point cane with little to minimal assitance. Patient reports increased fatigue at end of session indicating decreased muscular endurance and strength and will benefit from further skilled therapy to return to prior level of function.     Rehab Potential Fair   Clinical Impairments Affecting Rehab Potential (+) highly motivated, family support (-) age, chronicity of weakness   PT Frequency 2x / week   PT Duration 6 weeks   PT Treatment/Interventions Passive  range of motion;Manual techniques;Electrical Stimulation;Cryotherapy;Balance training;Therapeutic exercise;Therapeutic activities;Stair training;Neuromuscular re-education;Patient/family education   PT Next Visit Plan Muscular strengthening, endurance, standing balance, gait training   PT Home Exercise Plan seated clamshells YTB, Sit to stand, ball squeeze/glute squeeze   Consulted and Agree with Plan of Care Patient      Patient will benefit from skilled therapeutic intervention in order to improve the following deficits and impairments:  Decreased balance, Difficulty walking, Decreased safety awareness, Decreased endurance, Decreased strength, Hypomobility, Impaired UE functional use, Decreased activity tolerance, Decreased mobility, Postural dysfunction, Abnormal gait, Decreased coordination  Visit Diagnosis: Difficulty in walking  Muscle weakness (generalized)     Problem List There are no active problems to display for this patient.   [name], PT DPT 03/27/2016, 2:42 PM  [place] [facility] MAIN [facility] SERVICES [address] [place], [place], [address] Phone: [phone]   Fax:  [phone]  Name: Alyssa Adams MRN: [id number] Date of Birth: 08/25/1933

## 2016-03-29 ENCOUNTER — Ambulatory Visit: Payer: PPO

## 2016-03-29 DIAGNOSIS — M6281 Muscle weakness (generalized): Secondary | ICD-10-CM

## 2016-03-29 DIAGNOSIS — R262 Difficulty in walking, not elsewhere classified: Secondary | ICD-10-CM | POA: Diagnosis not present

## 2016-03-29 NOTE — Therapy (Signed)
[place] [facility] MAIN [facility] SERVICES [address] [place], [place], [address] Phone: [phone]   Fax:  [phone]  Physical Therapy Treatment  Patient Details  Name: Alyssa Adams MRN: [id number] Date of Birth: 01/03/1934 Referring Provider: Jerl Mina MD  Encounter Date: 03/29/2016      PT End of Session - 03/29/16 1354    Visit Number 6   Number of Visits 12   Date for PT Re-Evaluation 04/24/16   Authorization Type 6/10 G code   PT Start Time 1300   PT Stop Time 1346   PT Time Calculation (min) 46 min   Equipment Utilized During Treatment Gait belt   Activity Tolerance Patient tolerated treatment well;No increased pain   Behavior During Therapy WFL for tasks assessed/performed      Past Medical History:  Diagnosis Date  . Depression   . GERD (gastroesophageal reflux disease)   . Hypertension   . Stroke (cerebrum) [facility])     Past Surgical History:  Procedure Laterality Date  . ABDOMINAL HYSTERECTOMY    . GALLBLADDER SURGERY      There were no vitals filed for this visit.      Subjective Assessment - 03/29/16 1307    Subjective Patient states she's been using the quad can at home to amb after performing last session. Patient reports increased weakness in her R leg.    Pertinent History  CVA in 2006; Received PT in May 2015, progressive weakness since discharge secondary to not performing HEP and decreased activity   Limitations Lifting;Walking;Standing   How long can you stand comfortably?   How long can you walk comfortably? 15 min   Patient Stated Goals Increase balance, walking, going where she wants to travel   Currently in Pain? No/denies      Treatment: Therapeutic Exercise: Nustep: Seat level 7 level: 2 5.5 min Ambulation - 144ft with quad cane with cueing on steppage pattern, stride length x 2 Pre-gait weight shifts forward/backward - x 20 bilaterally Heel raises in  bars - x 20 with B LE Tandem  ambulation with single UE support - x 4 down and back Single leg balance with handheld assist - CGA for L LE, minA-modA for R LE Single leg stepping with posterior and anterior weight shifts to simulate ambulation          PT Education - 03/29/16 1352    Education provided Yes   Education Details Cueing on stride length and gait speed throughout treatment session   Person(s) Educated Patient   Methods Explanation;Demonstration   Comprehension Verbalized understanding;Returned demonstration             PT Long Term Goals - 03/13/16 1443      PT LONG TERM GOAL #1   Title Patient will score a 30/80 on the LEFS to demonstrate significant improvement in LE function and ability to perform squatting activities   Baseline LEFS: 11/80   Status New     PT LONG TERM GOAL #2   Title Patient will complete a TUG test in < 20 seconds without an AD for independent mobility and decreased fall risk.   Baseline TUG: 70sec   Status New     PT LONG TERM GOAL #3   Title Patient will complete five times sit to stand test in < 16 seconds indicating an increased LE strength and improved balance.   Baseline 5xSTS: 21.5sec   Status New     PT LONG TERM GOAL #4  Title Patient will improve gait speed to >.8 m/s to demonstrate significant improvement in dynamic balance and decreased in fall risk.    Baseline gait speed: .2498m/s   Status New               Plan - 03/29/16 1358    Clinical Impression Statement Patient demonstrates decreased R stride length, gait speed, and heel strike with ambulation. Focused on performing single leg stabilization on the R LE and patient demonstrated improved carryover of cues at end of treatment session. Patient will benefit from further skilled therapy focused on single leg balance to return to prior level of function.    Rehab Potential Fair   Clinical Impairments Affecting Rehab Potential (+) highly motivated, family support (-) age, chronicity of  weakness   PT Frequency 2x / week   PT Duration 6 weeks   PT Treatment/Interventions Passive range of motion;Manual techniques;Electrical Stimulation;Cryotherapy;Balance training;Therapeutic exercise;Therapeutic activities;Stair training;Neuromuscular re-education;Patient/family education   PT Next Visit Plan Muscular strengthening, endurance, standing balance, gait training   PT Home Exercise Plan seated clamshells YTB, Sit to stand, ball squeeze/glute squeeze   Consulted and Agree with Plan of Care Patient      Patient will benefit from skilled therapeutic intervention in order to improve the following deficits and impairments:  Decreased balance, Difficulty walking, Decreased safety awareness, Decreased endurance, Decreased strength, Hypomobility, Impaired UE functional use, Decreased activity tolerance, Decreased mobility, Postural dysfunction, Abnormal gait, Decreased coordination  Visit Diagnosis: Difficulty in walking  Muscle weakness (generalized)     Problem List There are no active problems to display for this patient.   Myrene GalasWesley Azuree Minish, PT DPT 03/29/2016, 2:04 PM  [place] [facility] [address] [place], [place], [address] Phone: [phone]   Fax:  [phone]  Name: Alyssa Adams MRN: [id number] Date of Birth: 07/16/1933

## 2016-04-03 ENCOUNTER — Ambulatory Visit: Payer: PPO

## 2016-04-03 DIAGNOSIS — M6281 Muscle weakness (generalized): Secondary | ICD-10-CM

## 2016-04-03 DIAGNOSIS — R262 Difficulty in walking, not elsewhere classified: Secondary | ICD-10-CM

## 2016-04-03 NOTE — Therapy (Signed)
[place] [facility] [address] [place], [place], [address] Phone: [phone]   Fax:  [phone]  Physical Therapy Treatment  Patient Details  Name: Alyssa Adams MRN: [id number] Date of Birth: 07/26/1933 Referring Provider: Jerl MinaJames Hedrick MD  Encounter Date: 04/03/2016      PT End of Session - 04/03/16 1708    Visit Number 7   Number of Visits 12   Date for PT Re-Evaluation 04/24/16   Authorization Type 7/10 G code   PT Start Time 1615   PT Stop Time 1700   PT Time Calculation (min) 45 min   Equipment Utilized During Treatment Gait belt   Activity Tolerance Patient tolerated treatment well;No increased pain   Behavior During Therapy WFL for tasks assessed/performed      Past Medical History:  Diagnosis Date  . Depression   . GERD (gastroesophageal reflux disease)   . Hypertension   . Stroke (cerebrum) [facility](HCC)     Past Surgical History:  Procedure Laterality Date  . ABDOMINAL HYSTERECTOMY    . GALLBLADDER SURGERY      There were no vitals filed for this visit.      Subjective Assessment - 04/03/16 1619    Subjective Patient states she's been feeling "tired" and has continued to use quad cane around the home but has not used it for community distances.    Pertinent History  CVA in 2006; Received PT in May 2015, progressive weakness since discharge secondary to not performing HEP and decreased activity   Limitations Lifting;Walking;Standing   How long can you stand comfortably? 20min   How long can you walk comfortably? 15 min   Patient Stated Goals Increase balance, walking, going where she wants to travel      Treatment: Therapeutic Exercise: Ambulation - 18820ft with quad cane with cueing on steppage pattern, stride length x 2 Pre-gait weight shifts forward/backward - 2 x 20 bilaterally Heel raises in  bars - x 20 with B LE Feet together balance EO with head turns - x 10 Up/down, left/right Marches on  airex pad - x20 bilaterally  Mini Squats with B UE support - 2 x 10 Hip abduction with UE support - x 20 Hip extension with UE support - x 20 *Pt required cueing on knee position and UE assist during entirety of visit.        PT Education - 04/03/16 1707    Education provided Yes   Education Details HEP: Hip abduction, hip ext, mini squat, marches with UE suport, balance with head turns   Person(s) Educated Patient   Methods Explanation;Demonstration   Comprehension Verbalized understanding;Returned demonstration             PT Long Term Goals - 03/13/16 1443      PT LONG TERM GOAL #1   Title Patient will score a 30/80 on the LEFS to demonstrate significant improvement in LE function and ability to perform squatting activities   Baseline LEFS: 11/80   Status New     PT LONG TERM GOAL #2   Title Patient will complete a TUG test in < 20 seconds without an AD for independent mobility and decreased fall risk.   Baseline TUG: 70sec   Status New     PT LONG TERM GOAL #3   Title Patient will complete five times sit to stand test in < 16 seconds indicating an increased LE strength and improved balance.   Baseline 5xSTS: 21.5sec   Status  New     PT LONG TERM GOAL #4   Title Patient will improve gait speed to >.8 m/s to demonstrate significant improvement in dynamic balance and decreased in fall risk.    Baseline gait speed: .218m/s   Status New               Plan - 04/03/16 1709    Clinical Impression Statement Patient demonstrates improved ambulation with improved step length and gait speed compared to previous visit indicated functional carryover between visits. Although patient is improving, she continues to demonstrate decreased dynamic balance and strength and will benefit from further skilled therapy to return to prior level of function.    Rehab Potential Fair   Clinical Impairments Affecting Rehab Potential (+) highly motivated, family support (-) age,  chronicity of weakness   PT Frequency 2x / week   PT Duration 6 weeks   PT Treatment/Interventions Passive range of motion;Manual techniques;Electrical Stimulation;Cryotherapy;Balance training;Therapeutic exercise;Therapeutic activities;Stair training;Neuromuscular re-education;Patient/family education   PT Next Visit Plan Muscular strengthening, endurance, standing balance, gait training   PT Home Exercise Plan seated clamshells YTB, Sit to stand, ball squeeze/glute squeeze   Consulted and Agree with Plan of Care Patient      Patient will benefit from skilled therapeutic intervention in order to improve the following deficits and impairments:  Decreased balance, Difficulty walking, Decreased safety awareness, Decreased endurance, Decreased strength, Hypomobility, Impaired UE functional use, Decreased activity tolerance, Decreased mobility, Postural dysfunction, Abnormal gait, Decreased coordination  Visit Diagnosis: Difficulty in walking  Muscle weakness (generalized)     Problem List There are no active problems to display for this patient.   [name], PT DPT 04/03/2016, 5:13 PM  [place] [facility] MAIN [facility] SERVICES [address] [place], [place], [address] Phone: [phone]   Fax:  [phone]  Name: Alyssa Adams MRN: [id number] Date of Birth: [birth year]

## 2016-04-05 ENCOUNTER — Ambulatory Visit: Payer: PPO | Admitting: Physical Therapy

## 2016-04-05 ENCOUNTER — Encounter: Payer: Self-pay | Admitting: Physical Therapy

## 2016-04-05 VITALS — BP 151/83 | HR 88

## 2016-04-05 DIAGNOSIS — R262 Difficulty in walking, not elsewhere classified: Secondary | ICD-10-CM | POA: Diagnosis not present

## 2016-04-05 DIAGNOSIS — M6281 Muscle weakness (generalized): Secondary | ICD-10-CM

## 2016-04-05 NOTE — Therapy (Signed)
[place] [facility] MAIN [facility] SERVICES [address] [place], [place], [address] Phone: [phone]   Fax:  [phone]  Physical Therapy Treatment  Patient Details  Name: Alyssa Adams MRN: [id number] Date of Birth: 1934/04/16 Referring Provider: Jerl Mina MD  Encounter Date: 04/05/2016      PT End of Session - 04/05/16 1511    Visit Number 8   Number of Visits 12   Date for PT Re-Evaluation 04/24/16   Authorization Type 8/10 G code   PT Start Time 1436   PT Stop Time 1525   PT Time Calculation (min) 49 min   Equipment Utilized During Treatment Gait belt   Activity Tolerance Patient tolerated treatment well;No increased pain   Behavior During Therapy WFL for tasks assessed/performed      Past Medical History:  Diagnosis Date  . Depression   . GERD (gastroesophageal reflux disease)   . Hypertension   . Stroke (cerebrum) [facility])     Past Surgical History:  Procedure Laterality Date  . ABDOMINAL HYSTERECTOMY    . GALLBLADDER SURGERY      Vitals:   04/05/16 1439  BP: (!) 151/83  Pulse: 88  SpO2: 92%        Subjective Assessment - 04/05/16 1439    Subjective Pt reports her lower and upper back is bothering her today (5/10).  This pain began yesterday which she attributes to her either her sleeping position or due to using her LUE more in therapy.  She has been practicing ambulating with her quad cane and taking larger steps with her L foot.   Pertinent History  CVA in 2006; Received PT in May 2015, progressive weakness since discharge secondary to not performing HEP and decreased activity   Limitations Lifting;Walking;Standing   How long can you stand comfortably?   How long can you walk comfortably? 15 min   Patient Stated Goals Increase balance, walking, going where she wants to travel   Currently in Pain? Yes   Pain Score 5    Pain Location Back   Pain Orientation Lower;Upper   Pain Descriptors / Indicators Aching    Pain Type Acute pain   Pain Onset Today   Pain Frequency Rarely        TREATMENT   Therapeutic Exercise and Gait Training:  Sit<>stand x10 with cues for eccentric control. Pt alternates between using 1 UE to no UE  Bil leg press 90#, 3x10 with cues for controlled eccentric lowering  Walking in hallway with vertical and horizontal head turns  Walking in hallway with cues for increasing step length on L and forward gaze  Walking in hallway with spontaneous cues to step L, R, forward, backward   Neuromuscular Rehab:  Rhomberg stance on airex with eyes closed 3x30 seconds  Alternating toe taps up to 6" step with pt reaching out for // bars with L hand, 2x20  Tandem stance on  foam roll 2x30 seconds each side forward                   PT Education - 04/05/16 1441    Education provided Yes   Education Details Exercise Technique   Person(s) Educated Patient   Methods Explanation;Demonstration;Tactile cues;Verbal cues   Comprehension Verbalized understanding;Returned demonstration             PT Long Term Goals - 03/13/16 1443      PT LONG TERM GOAL #1   Title Patient will score a 30/80  on the LEFS to demonstrate significant improvement in LE function and ability to perform squatting activities   Baseline LEFS: 11/80   Status New     PT LONG TERM GOAL #2   Title Patient will complete a TUG test in < 20 seconds without an AD for independent mobility and decreased fall risk.   Baseline TUG: 70sec   Status New     PT LONG TERM GOAL #3   Title Patient will complete five times sit to stand test in < 16 seconds indicating an increased LE strength and improved balance.   Baseline 5xSTS: 21.5sec   Status New     PT LONG TERM GOAL #4   Title Patient will improve gait speed to >.8 m/s to demonstrate significant improvement in dynamic balance and decreased in fall risk.    Baseline gait speed: .21282m/s   Status New               Plan - 04/05/16 1539     Clinical Impression Statement Pt is able to improve her L step length at times without cues but requires max verbal cues for forward gaze when ambulating.  She tolerated all interventions well this date.  She will benefit from continued skilled PT services to improve gait pattern, strength, and balance.   Rehab Potential Fair   Clinical Impairments Affecting Rehab Potential (+) highly motivated, family support (-) age, chronicity of weakness   PT Frequency 2x / week   PT Duration 6 weeks   PT Treatment/Interventions Passive range of motion;Manual techniques;Electrical Stimulation;Cryotherapy;Balance training;Therapeutic exercise;Therapeutic activities;Stair training;Neuromuscular re-education;Patient/family education   PT Next Visit Plan Muscular strengthening, endurance, standing balance, gait training   PT Home Exercise Plan seated clamshells YTB, Sit to stand, ball squeeze/glute squeeze   Consulted and Agree with Plan of Care Patient      Patient will benefit from skilled therapeutic intervention in order to improve the following deficits and impairments:  Decreased balance, Difficulty walking, Decreased safety awareness, Decreased endurance, Decreased strength, Hypomobility, Impaired UE functional use, Decreased activity tolerance, Decreased mobility, Postural dysfunction, Abnormal gait, Decreased coordination  Visit Diagnosis: Difficulty in walking  Muscle weakness (generalized)     Problem List There are no active problems to display for this patient.   Encarnacion ChuAshley Abashian PT, DPT 04/05/2016, 3:44 PM  [place] [facility] [address] [place], [place], [address] Phone: [phone]   Fax:  [phone]  Name: Alyssa Adams MRN: [id number] Date of Birth: 08/30/1933

## 2016-04-10 ENCOUNTER — Ambulatory Visit: Payer: PPO

## 2016-04-10 DIAGNOSIS — R262 Difficulty in walking, not elsewhere classified: Secondary | ICD-10-CM

## 2016-04-10 DIAGNOSIS — M6281 Muscle weakness (generalized): Secondary | ICD-10-CM

## 2016-04-10 NOTE — Therapy (Signed)
[place] [facility] [address] [place], [place], [address] Phone: [phone]   Fax:  [phone]  Physical Therapy Treatment  Patient Details  Name: Alyssa Adams MRN: [id number] Date of Birth: 10/23/1933 Referring Provider: Jerl MinaJames Hedrick MD  Encounter Date: 04/10/2016      PT End of Session - 04/10/16 1631    Visit Number 9   Number of Visits 12   Date for PT Re-Evaluation 04/24/16   Authorization Type 9/10 G code   PT Start Time 1430   PT Stop Time 1514   PT Time Calculation (min) 44 min   Equipment Utilized During Treatment Gait belt   Activity Tolerance Patient tolerated treatment well;No increased pain   Behavior During Therapy WFL for tasks assessed/performed      Past Medical History:  Diagnosis Date  . Depression   . GERD (gastroesophageal reflux disease)   . Hypertension   . Stroke (cerebrum) [facility](HCC)     Past Surgical History:  Procedure Laterality Date  . ABDOMINAL HYSTERECTOMY    . GALLBLADDER SURGERY      There were no vitals filed for this visit.      Subjective Assessment - 04/10/16 1448    Subjective (P)  Pt reports her lower and upper back is bothering her today (5/10).  This pain began yesterday which she attributes to her either her sleeping position or due to using her LUE more in therapy.  She has been practicing ambulating with her quad cane and taking larger steps with her L foot.   Pertinent History (P)   CVA in 2006; Received PT in May 2015, progressive weakness since discharge secondary to not performing HEP and decreased activity   Limitations (P)  Lifting;Walking;Standing   How long can you stand comfortably? (P)  20min   How long can you walk comfortably? (P)  15 min   Patient Stated Goals (P)  Increase balance, walking, going where she wants to travel   Pain Onset (P)  Today         Treatment: Therapeutic Exercise: Sit to stands with left foot out in front - 2 x 15 Seated  hip abduction with cueing on hip movement - 2 x 15 Leg Press with B LE - 2 x 10 105#, x 10 90#; Calf rasises 105# 2 x 15 Hip abduction with UE support - x 20 Side stepping up and over on airex pad - x20 Feet together balance EC - x 2 x 1 min  Marches on airex pad - x20 bilaterally  *Pt required cueing on knee position and UE assist during entirety of visit.        PT Education - 04/10/16 1630    Education provided Yes   Education Details Exercise form, importance of improving gait speed   Person(s) Educated Patient   Methods Explanation;Demonstration   Comprehension Verbalized understanding;Returned demonstration             PT Long Term Goals - 03/13/16 1443      PT LONG TERM GOAL #1   Title Patient will score a 30/80 on the LEFS to demonstrate significant improvement in LE function and ability to perform squatting activities   Baseline LEFS: 11/80   Status New     PT LONG TERM GOAL #2   Title Patient will complete a TUG test in < 20 seconds without an AD for independent mobility and decreased fall risk.   Baseline TUG: 70sec   Status  New     PT LONG TERM GOAL #3   Title Patient will complete five times sit to stand test in < 16 seconds indicating an increased LE strength and improved balance.   Baseline 5xSTS: 21.5sec   Status New     PT LONG TERM GOAL #4   Title Patient will improve gait speed to >.8 m/s to demonstrate significant improvement in dynamic balance and decreased in fall risk.    Baseline gait speed: .23691m/s   Status New               Plan - 04/10/16 1631    Clinical Impression Statement Patient demonstrates improved L step length upon entering the clinic indicating functional carryover between visits. Patient continues to require increased cueing during exercises for form and balance; pt will benefit from further skilled therapy focused on improving strength, endurance, and coordination to return to prior level of function.    Rehab Potential  Fair   Clinical Impairments Affecting Rehab Potential (+) highly motivated, family support (-) age, chronicity of weakness   PT Frequency 2x / week   PT Duration 6 weeks   PT Treatment/Interventions Passive range of motion;Manual techniques;Electrical Stimulation;Cryotherapy;Balance training;Therapeutic exercise;Therapeutic activities;Stair training;Neuromuscular re-education;Patient/family education   PT Next Visit Plan Muscular strengthening, endurance, standing balance, gait training   PT Home Exercise Plan seated clamshells YTB, Sit to stand, ball squeeze/glute squeeze   Consulted and Agree with Plan of Care Patient      Patient will benefit from skilled therapeutic intervention in order to improve the following deficits and impairments:  Decreased balance, Difficulty walking, Decreased safety awareness, Decreased endurance, Decreased strength, Hypomobility, Impaired UE functional use, Decreased activity tolerance, Decreased mobility, Postural dysfunction, Abnormal gait, Decreased coordination  Visit Diagnosis: Difficulty in walking  Muscle weakness (generalized)     Problem List There are no active problems to display for this patient.   Myrene GalasWesley Revanth Neidig, PT DPT 04/10/2016, 4:34 PM  [place] [facility] [address] [place], [place], [address] Phone: [phone]   Fax:  [phone]  Name: Alyssa Adams MRN: [id number] Date of Birth: 07/03/1933

## 2016-04-12 ENCOUNTER — Ambulatory Visit: Payer: PPO | Attending: Family Medicine

## 2016-04-12 DIAGNOSIS — M6281 Muscle weakness (generalized): Secondary | ICD-10-CM

## 2016-04-12 DIAGNOSIS — R262 Difficulty in walking, not elsewhere classified: Secondary | ICD-10-CM | POA: Insufficient documentation

## 2016-04-13 NOTE — Therapy (Signed)
[facility] [address] [place], [place], [address] Phone: [phone]   Fax:  [phone]  Physical Therapy Treatment  Patient Details  Name: Alyssa Adams MRN: [id number] Date of Birth: 08/18/1933 Referring Provider: Jerl MinaJames Hedrick MD  Encounter Date: 04/12/2016      PT End of Session - 04/13/16 1217    Visit Number 10   Number of Visits 12   Date for PT Re-Evaluation 04/24/16   Authorization Type 10/10 G code   PT Start Time 1430   PT Stop Time 1515   PT Time Calculation (min) 45 min   Equipment Utilized During Treatment Gait belt   Activity Tolerance Patient tolerated treatment well;No increased pain   Behavior During Therapy WFL for tasks assessed/performed      Past Medical History:  Diagnosis Date  . Depression   . GERD (gastroesophageal reflux disease)   . Hypertension   . Stroke (cerebrum) [facility](HCC)     Past Surgical History:  Procedure Laterality Date  . ABDOMINAL HYSTERECTOMY    . GALLBLADDER SURGERY      There were no vitals filed for this visit.      Subjective Assessment - 04/12/16 1438    Subjective Patient states she feels tired and reports minimal fatigue after the previous treatment. Patient reports her legs still feel weak.    Pertinent History  CVA in 2006; Received PT in May 2015, progressive weakness since discharge secondary to not performing HEP and decreased activity   Limitations Lifting;Walking;Standing   How long can you stand comfortably? 20min   How long can you walk comfortably? 15 min   Patient Stated Goals Increase balance, walking, going where she wants to travel   Pain Onset Today      Outcome measures: TUG: 45sec 10mwt: .36373m/s 5xSTS: 15.5sec LEFS: 27/80 TREATMENT Therapeutic exercise Nustep level 3 6 min for warmup during therapy session  Ambulating with walker with cueing on stepping pattern and appropriate use on SPC - 18200ft Obstacle course ambulation to  sit to stand - performed x 3 with backwards counting to challenge cognition Sit to stands with cueing on foot and hip positioning - 3 x 10 Leg Press 105# -- 3 x 10 with increased verbal cueing on foot positioning during session * Patient Required frequent sitting rest breaks throughout sessions        PT Education - 04/13/16 1215    Education provided Yes   Education Details Educated on exercise technique and POC   Person(s) Educated Patient   Methods Explanation;Demonstration   Comprehension Verbalized understanding;Returned demonstration             PT Long Term Goals - 04/12/16 1451      PT LONG TERM GOAL #1   Title Patient will score a 30/80 on the LEFS to demonstrate significant improvement in LE function and ability to perform squatting activities   Baseline LEFS: 11/80 04/12/16: 27/80   Status On-going     PT LONG TERM GOAL #2   Title Patient will complete a TUG test in < 20 seconds without an AD for independent mobility and decreased fall risk.   Baseline TUG: 70sec 04/12/16: 45sec   Status On-going     PT LONG TERM GOAL #3   Title Patient will complete five times sit to stand test in < 16 seconds indicating an increased LE strength and improved balance.   Baseline 5xSTS: 21.5sec 04/12/16: 15.5 sec   Status Achieved  PT LONG TERM GOAL #4   Title Patient will improve gait speed to >.8 m/s to demonstrate significant improvement in dynamic balance and decreased in fall risk.    Baseline gait speed: .23253m/s 04/12/16: .37461m/s    Status On-going               Plan - 04/13/16 1218    Clinical Impression Statement Patient demonstrates significant improvements in TUG, 47mwt, 5xSTS, and LEFS score. Although patient is improving, she continues to demonstrate significant limitations with functional strength and balance. Patient continues to be a significant fall risk based on TUG and 47mwt and will benefit from further skilled therapy to return to prior level of  function   Rehab Potential Fair   Clinical Impairments Affecting Rehab Potential (+) highly motivated, family support (-) age, chronicity of weakness   PT Frequency 2x / week   PT Duration 6 weeks   PT Treatment/Interventions Passive range of motion;Manual techniques;Electrical Stimulation;Cryotherapy;Balance training;Therapeutic exercise;Therapeutic activities;Stair training;Neuromuscular re-education;Patient/family education   PT Next Visit Plan Muscular strengthening, endurance, standing balance, gait training   PT Home Exercise Plan seated clamshells YTB, Sit to stand, ball squeeze/glute squeeze   Consulted and Agree with Plan of Care Patient      Patient will benefit from skilled therapeutic intervention in order to improve the following deficits and impairments:  Decreased balance, Difficulty walking, Decreased safety awareness, Decreased endurance, Decreased strength, Hypomobility, Impaired UE functional use, Decreased activity tolerance, Decreased mobility, Postural dysfunction, Abnormal gait, Decreased coordination  Visit Diagnosis: Difficulty in walking  Muscle weakness (generalized)       G-Codes - 04/13/16 1244    Functional Assessment Tool Used TUG, 5xSTS, 547m walk, clinical judgement   Functional Limitation Mobility: Walking and moving around   Mobility: Walking and Moving Around Current Status [phone]([id number]) At least 40 percent but less than 60 percent impaired, limited or restricted   Mobility: Walking and Moving Around Goal Status [phone]([id number]) At least 20 percent but less than 40 percent impaired, limited or restricted      Problem List There are no active problems to display for this patient.   Myrene GalasWesley Darry Kelnhofer, PT DPT 04/13/2016, 12:45 PM  [place] [facility] [address] [name], [place], [address] Phone: [phone]   Fax:  [phone]  Name: Alyssa Adams MRN: [id number] Date of Birth: 04/09/1934

## 2016-04-14 DIAGNOSIS — H353221 Exudative age-related macular degeneration, left eye, with active choroidal neovascularization: Secondary | ICD-10-CM | POA: Diagnosis not present

## 2016-04-17 ENCOUNTER — Ambulatory Visit: Payer: PPO

## 2016-04-17 DIAGNOSIS — R262 Difficulty in walking, not elsewhere classified: Secondary | ICD-10-CM

## 2016-04-17 DIAGNOSIS — M6281 Muscle weakness (generalized): Secondary | ICD-10-CM

## 2016-04-17 NOTE — Therapy (Signed)
[facility] [address] [place], [place], [address] Phone: [phone]   Fax:  [phone]  Physical Therapy Treatment  Patient Details  Name: Alyssa Adams MRN: [id number] Date of Birth: 10/04/1933 Referring Provider: Jerl MinaJames Hedrick MD  Encounter Date: 04/17/2016      PT End of Session - 04/17/16 1525    Visit Number 11   Number of Visits 12   Date for PT Re-Evaluation 04/24/16   Authorization Type 1/10 G code   PT Start Time 1445   PT Stop Time 1530   PT Time Calculation (min) 45 min   Equipment Utilized During Treatment Gait belt   Activity Tolerance Patient tolerated treatment well;No increased pain   Behavior During Therapy WFL for tasks assessed/performed      Past Medical History:  Diagnosis Date  . Depression   . GERD (gastroesophageal reflux disease)   . Hypertension   . Stroke (cerebrum) [facility](HCC)     Past Surgical History:  Procedure Laterality Date  . ABDOMINAL HYSTERECTOMY    . GALLBLADDER SURGERY      There were no vitals filed for this visit.      Subjective Assessment - 04/17/16 1449    Subjective Patient states the right leg feels a little weak today stating her back hurts from walking this weekend.    Pertinent History  CVA in 2006; Received PT in May 2015, progressive weakness since discharge secondary to not performing HEP and decreased activity   Limitations Lifting;Walking;Standing   How long can you stand comfortably? 20min   How long can you walk comfortably? 15 min   Patient Stated Goals Increase balance, walking, going where she wants to travel   Currently in Pain? Yes   Pain Score 9    Pain Location Back   Pain Orientation Lower   Pain Descriptors / Indicators Aching   Pain Onset Today   Pain Frequency Rarely      TREATMENT: Sit to stands 3 x 10 with green GTB around knees with left heel forward  Seated clamshells with GTB - 2 x 20  Marches in standing with B UE  support - 2 x 13 Standing feet together EC - x 30sec Standing Semi tandem EC - 2 x 45sec Side stepping up and over on blue airex pad - x8 Forward step ups onto airex pad - x6 Standing hip extension - 2 x10 Standing hip abduction - 2x 10 *required less UE support to perform exercises compared to previous treatments           PT Education - 04/17/16 1524    Education provided Yes   Education Details Educated on joint position and muscle activation with exercise   Person(s) Educated Patient   Methods Explanation;Demonstration   Comprehension Verbalized understanding;Returned demonstration             PT Long Term Goals - 04/12/16 1451      PT LONG TERM GOAL #1   Title Patient will score a 30/80 on the LEFS to demonstrate significant improvement in LE function and ability to perform squatting activities   Baseline LEFS: 11/80 04/12/16: 27/80   Status On-going     PT LONG TERM GOAL #2   Title Patient will complete a TUG test in < 20 seconds without an AD for independent mobility and decreased fall risk.   Baseline TUG: 70sec 04/12/16: 45sec   Status On-going     PT LONG TERM GOAL #3  Title Patient will complete five times sit to stand test in < 16 seconds indicating an increased LE strength and improved balance.   Baseline 5xSTS: 21.5sec 04/12/16: 15.5 sec   Status Achieved     PT LONG TERM GOAL #4   Title Patient will improve gait speed to >.8 m/s to demonstrate significant improvement in dynamic balance and decreased in fall risk.    Baseline gait speed: .26829m/s 04/12/16: .36120m/s    Status On-going               Plan - 04/17/16 1526    Clinical Impression Statement Patient demonstrates siginificant improvement with exercise performance today with ability to tolerate increase in exercise progression with decreased use of UE and less postural sway. Although patient is improving, she conitnues to demonstrate decreased stride length bilaterally when ambulating and  will benefit from further skilled therapy to decrease fall risk.    Rehab Potential Fair   Clinical Impairments Affecting Rehab Potential (+) highly motivated, family support (-) age, chronicity of weakness   PT Frequency 2x / week   PT Duration 6 weeks   PT Treatment/Interventions Passive range of motion;Manual techniques;Electrical Stimulation;Cryotherapy;Balance training;Therapeutic exercise;Therapeutic activities;Stair training;Neuromuscular re-education;Patient/family education   PT Next Visit Plan Muscular strengthening, endurance, standing balance, gait training   PT Home Exercise Plan seated clamshells YTB, Sit to stand, ball squeeze/glute squeeze   Consulted and Agree with Plan of Care Patient      Patient will benefit from skilled therapeutic intervention in order to improve the following deficits and impairments:  Decreased balance, Difficulty walking, Decreased safety awareness, Decreased endurance, Decreased strength, Hypomobility, Impaired UE functional use, Decreased activity tolerance, Decreased mobility, Postural dysfunction, Abnormal gait, Decreased coordination  Visit Diagnosis: Difficulty in walking  Muscle weakness (generalized)     Problem List There are no active problems to display for this patient.   Myrene GalasWesley Catalea Labrecque, PT DPT 04/17/2016, 4:54 PM  [place] [facility] [address] [place], [place], [address] Phone: [phone]   Fax:  [phone]  Name: Alyssa Adams MRN: [id number] Date of Birth: 09/11/1933

## 2016-04-24 ENCOUNTER — Ambulatory Visit: Payer: PPO

## 2016-04-26 ENCOUNTER — Ambulatory Visit: Payer: PPO

## 2016-04-26 ENCOUNTER — Ambulatory Visit
Admission: EM | Admit: 2016-04-26 | Discharge: 2016-04-26 | Disposition: A | Discharge status: Home / Self Care | Payer: PPO | Attending: Family Medicine | Admitting: Family Medicine

## 2016-04-26 DIAGNOSIS — J069 Acute upper respiratory infection, unspecified: Secondary | ICD-10-CM | POA: Diagnosis not present

## 2016-04-26 MED ORDER — BENZONATATE 100 MG PO CAPS: 100.0000 mg | ORAL_CAPSULE | Freq: Three times a day (TID) | ORAL | 0 refills | Status: DC | PRN

## 2016-04-26 MED ORDER — DOXYCYCLINE HYCLATE 100 MG PO CAPS: 100.0000 mg | ORAL_CAPSULE | Freq: Two times a day (BID) | ORAL | 0 refills | Status: DC

## 2016-04-26 NOTE — ED Provider Notes (Signed)
[facility] ____________________________________________  Time seen: Approximately 2:26 PM  I have reviewed the triage vital signs and the nursing notes.   HISTORY  Chief Complaint Cough   HPI Alyssa Adams is a 82 y.o. female presents with daughter at bedside for complaints of 2-3 days of runny nose, nasal congestion, cough and sore throat. States sore throat present today upon wakening. States occasional cough, and reports dry cough. Denies fevers. Denies home sick contacts, but reports she is in physical therapy at the hospital and possible exposures there. Reports continues to drink fluids well, but not eating as much as normal. States has not taken any medications at home for the same complaints. Reports has continued home medications. Denies fevers or chills.   Denies any recent sickness, recent antibiotic use or recent hospitalization. Reports has remained ambulatory. Denies chest pain, shortness of breath, dysuria, abdominal pain, dizziness, weakness, extremity pain or extremity swelling. Denies renal insufficiency. Denies cardiac history.   PCP: Jerl MinaJames Hedrick, MD    Past Medical History:  Diagnosis Date  . Depression   . GERD (gastroesophageal reflux disease)   . Hypertension   . Stroke (cerebrum) (HCC)     There are no active problems to display for this patient.   Past Surgical History:  Procedure Laterality Date  . ABDOMINAL HYSTERECTOMY    . GALLBLADDER SURGERY      No current facility-administered medications for this encounter.   Current Outpatient Prescriptions:  .  amLODipine-atorvastatin (CADUET) 5-10 MG tablet, Take 1 tablet by mouth daily., Disp: , Rfl:  .  cholestyramine light (PREVALITE) 4 g packet, TAKE 1 PACKET BY MOUTH EVERY MORNING BEFORE BREAKFAST, Disp: , Rfl:  .  Cyanocobalamin (VITAMIN B-12 CR) 1000 MCG TBCR, Take by mouth., Disp: , Rfl:  .  dipyridamole-aspirin (AGGRENOX) 200-25 MG 12hr capsule, TAKE ONE CAPSULE TWICE A DAY,  Disp: , Rfl:  .  metoprolol succinate (TOPROL-XL) 50 MG 24 hr tablet, TAKE 1 TABLET EVERY DAY, Disp: , Rfl:  .  mirabegron ER (MYRBETRIQ) 50 MG TB24 tablet, Take 1 tablet (50 mg total) by mouth daily., Disp: 30 tablet, Rfl: 11 .  pantoprazole (PROTONIX) 40 MG tablet, TAKE 1 TABLET EVERY DAY, Disp: , Rfl:  .  sucralfate (CARAFATE) 1 g tablet, Take 1 g by mouth 4 (four) times daily -  with meals and at bedtime., Disp: , Rfl:  .  venlafaxine XR (EFFEXOR-XR) 150 MG 24 hr capsule, TAKE ONE CAPSULE DAILY, Disp: , Rfl:  .  Vitamin D, Ergocalciferol, (DRISDOL) 50000 units CAPS capsule, TAKE ONE CAPSULE TWICE A WEEK, Disp: , Rfl:  .  amLODipine (NORVASC) 5 MG tablet, TAKE 1 TABLET EVERY DAY, Disp: , Rfl:  .  benzonatate (TESSALON PERLES) 100 MG capsule, Take 1 capsule (100 mg total) by mouth 3 (three) times daily as needed., Disp: 15 capsule, Rfl: 0 .  doxycycline (VIBRAMYCIN) 100 MG capsule, Take 1 capsule (100 mg total) by mouth 2 (two) times daily., Disp: 20 capsule, Rfl: 0  Allergies Erythromycin ethylsuccinate and Sulfa antibiotics  Family History  Problem Relation Age of Onset  . Kidney cancer Father   . Prostate cancer Neg Hx     Social History Social History  Substance Use Topics  . Smoking status: Never Smoker  . Smokeless tobacco: Never Used  . Alcohol use No    Review of Systems Constitutional: No fever/chills Eyes: No visual changes. ENT: positive sore throat. Cardiovascular: Denies chest pain. Respiratory: Denies shortness of breath. Gastrointestinal: No abdominal  pain.  No nausea, no vomiting.  No diarrhea.  No constipation. Genitourinary: Negative for dysuria. Musculoskeletal: Negative for back pain. Skin: Negative for rash. Neurological: Negative for headaches, focal weakness or numbness.  10-point ROS otherwise negative.  ____________________________________________   PHYSICAL EXAM:  VITAL SIGNS: ED Triage Vitals  Enc Vitals Group     BP 04/26/16 1423 (!)  150/74     Pulse Rate 04/26/16 1423 97     Resp 04/26/16 1423 16     Temp 04/26/16 1423 99.7 F (37.6 C)     Temp Source 04/26/16 1423 Tympanic     SpO2 04/26/16 1423 96 %     Weight 04/26/16 1422 150 lb (68 kg)     Height 04/26/16 1422 5\' 2"  (1.575 m)     Head Circumference --      Peak Flow --      Pain Score 04/26/16 1424 0     Pain Loc --      Pain Edu? --      Excl. in GC? --    Today's Vitals   04/26/16 1423 04/26/16 1424 04/26/16 1452 04/26/16 1457  BP: (!) 150/74  (!) 154/72   Pulse: 97  91   Resp: 16  16   Temp: 99.7 F (37.6 C)     TempSrc: Tympanic     SpO2: 96%  100%   Weight:      Height:      PainSc:  0-No pain  0-No pain  Temp recheck: 98.9 degrees per RN  Constitutional: Alert and oriented. Well appearing and in no acute distress. Eyes: Conjunctivae are normal. PERRL. EOMI. Head: Atraumatic. No sinus tenderness to palpation. No swelling. No erythema.  Ears: no erythema, normal TMs bilaterally.   Nose:Nasal congestion with clear rhinorrhea  Mouth/Throat: Mucous membranes are moist. Mild pharyngeal erythema. No tonsillar swelling or exudate.  Neck: No stridor.  No cervical spine tenderness to palpation. Hematological/Lymphatic/Immunilogical: No cervical lymphadenopathy. Cardiovascular: Normal rate, regular rhythm. Grossly normal heart sounds.  Good peripheral circulation. Respiratory: Normal respiratory effort.  No retractions. Lungs CTAB. No wheezes, rales or rhonchi. Good air movement. Occasional dry intermittent cough noted in room. No focal area of consolidation auscultated.  Gastrointestinal: Soft and nontender. No CVA tenderness. Musculoskeletal: No lower or upper extremity tenderness nor edema. No cervical, thoracic or lumbar tenderness to palpation. Neurologic:  Normal speech and language. No gross focal neurologic deficits are appreciated. No gait instability. Skin:  Skin is warm, dry and intact. No rash noted. Psychiatric: Mood and affect are  normal. Speech and behavior are normal.  ___________________________________________   LABS (all labs ordered are listed, but only abnormal results are displayed)  Labs Reviewed  RAPID STREP SCREEN (NOT AT [facility])   ____________________________________________   PROCEDURES Procedures  ITIAL IMPRESSION / ASSESSMENT AND PLAN / ED COURSE  Pertinent labs & imaging results that were available during my care of the patient were reviewed by me and considered in my medical decision making (see chart for details).   Well appearing patient, no acute distress. Presents for 2-3 days of congestion, sore throat, and cough. Lungs clear throughout. Mild pharyngeal erythema. Discussed in detail with patient suspect viral upper respiratory infection and discussed use of over the counter nasal saline rinses and prn tessalon perles. Discussed evaluation of strep throat, and patient agreed to this. Discussed with patient and daughter in detail, will rx doxycycline and discussed if symptoms continue in 2 days to then start rx due to comorbidity history.  Discussed in detail for fever, chest pain, shortness of breath, or worsening concerns need to be immediately reevaluated. Discussed indication, risks and benefits of medications with patient.  Discussed follow up with Primary care physician this week. Discussed follow up and return parameters including no resolution or any worsening concerns. Patient verbalized understanding and agreed to plan.   RN reports patient refused strep swab prior to leaving.   ____________________________________________   FINAL CLINICAL IMPRESSION(S) / ED DIAGNOSES  Final diagnoses:  Upper respiratory tract infection, unspecified type     Discharge Medication List as of 04/26/2016  2:50 PM    START taking these medications   Details  benzonatate (TESSALON PERLES) 100 MG capsule Take 1 capsule (100 mg total) by mouth 3 (three) times daily as needed., Starting Wed 04/26/2016,  Normal    doxycycline (VIBRAMYCIN) 100 MG capsule Take 1 capsule (100 mg total) by mouth 2 (two) times daily., Starting Wed 04/26/2016, Normal        Note: This dictation was prepared with Dragon dictation along with smaller phrase technology. Any transcriptional errors that result from this process are unintentional.    Clinical Course       Renford DillsLindsey Mehmet Scally, NP 04/26/16 1529

## 2016-04-26 NOTE — Discharge Instructions (Signed)
Take medication as prescribed. Rest. Drink plenty of fluids.  ° °Follow up with your primary care physician this week. Return to Urgent care for new or worsening concerns.  ° °

## 2016-04-26 NOTE — ED Triage Notes (Signed)
Patient complains of cough, congestion and sore throat. Patient states that symptom started on Sunday and has been worsening.

## 2016-05-02 ENCOUNTER — Ambulatory Visit: Payer: PPO

## 2016-05-09 ENCOUNTER — Ambulatory Visit: Payer: PPO

## 2016-05-09 DIAGNOSIS — M6281 Muscle weakness (generalized): Secondary | ICD-10-CM

## 2016-05-09 DIAGNOSIS — R262 Difficulty in walking, not elsewhere classified: Secondary | ICD-10-CM | POA: Diagnosis not present

## 2016-05-09 NOTE — Therapy (Signed)
[facility] [address] [place], [place], [address] Phone: [phone]   Fax:  [phone]  Physical Therapy Treatment  Patient Details  Name: Alyssa FifeJanie M Cilento MRN: [id number] Date of Birth: 04/15/1934 Referring Provider: Jerl MinaJames Hedrick MD  Encounter Date: 05/09/2016      PT End of Session - 05/09/16 1538    Visit Number 12   Number of Visits 24   Date for PT Re-Evaluation 06/20/16   Authorization Type 2/10 G code   PT Start Time 1430   PT Stop Time 1515   PT Time Calculation (min) 45 min   Equipment Utilized During Treatment Gait belt   Activity Tolerance Patient tolerated treatment well;No increased pain   Behavior During Therapy WFL for tasks assessed/performed      Past Medical History:  Diagnosis Date  . Depression   . GERD (gastroesophageal reflux disease)   . Hypertension   . Stroke (cerebrum) [facility](HCC)     Past Surgical History:  Procedure Laterality Date  . ABDOMINAL HYSTERECTOMY    . GALLBLADDER SURGERY      There were no vitals filed for this visit.      Subjective Assessment - 05/09/16 1441    Subjective Patient report her legs are feeling weake today and has not been able to perform exercises secondary to being sick. Patient states [facility] which resulted in an URI. Patient states she may not be able to continue therapy for scheduling reasons.    Pertinent History  CVA in 2006; Received PT in May 2015, progressive weakness since discharge secondary to not performing HEP and decreased activity   Limitations Lifting;Walking;Standing   How long can you stand comfortably? 20min   How long can you walk comfortably? 15 min   Patient Stated Goals Increase balance, walking, going where she wants to travel   Currently in Pain? No/denies   Pain Onset Today            [facility] - 05/09/16 1519      Standardized Balance Assessment   Five times sit  to stand comments  15.5sec   10 Meter Walk .43     Timed Up and Go Test   Normal TUG (seconds) 42       TREATMENT: Sit to stands 2 x 10 with left heel forward  Marches in standing with B HHA support -  x 20 marches performed out laterally Standing hip abduction - 2x 10 Semi tandem amb - 2 x 3030ft Tandem ambulation - 2 x 6830ft Forward ambulation 4 x 5430ft with vcueing on stepping pattern and stride length.         PT Education - 05/09/16 1540    Education provided Yes   Education Details Educated on HEP performance and POC   Person(s) Educated Patient   Methods Explanation   Comprehension Verbalized understanding             PT Long Term Goals - 05/09/16 1443      PT LONG TERM GOAL #1   Title Patient will score a 30/80 on the LEFS to demonstrate significant improvement in LE function and ability to perform squatting activities   Baseline LEFS: 11/80 04/12/16: 27/80 05/09/16: 32/80   Time 6   Period Weeks   Status On-going     PT LONG TERM GOAL #2   Title Patient will complete a TUG test in < 20 seconds without  an AD for independent mobility and decreased fall risk.   Baseline TUG: 70sec 04/12/16: 45sec 05/09/16: 42sec   Time 6   Period Weeks   Status On-going     PT LONG TERM GOAL #3   Title Patient will complete five times sit to stand test in < 16 seconds indicating an increased LE strength and improved balance.   Baseline 5xSTS: 21.5sec 04/12/16: 15.5 sec 05/09/16: 15.5   Status Achieved     PT LONG TERM GOAL #4   Title Patient will improve gait speed to >.8 m/s to demonstrate significant improvement in dynamic balance and decreased in fall risk.    Baseline gait speed: .26040m/s 04/12/16: .358103m/s 05/09/16: .452100m/s   Status On-going               Plan - 05/09/16 1545    Clinical Impression Statement Patient demonstrates slight improvement with functional outcomes today indicating functional carryover between visits. Patient does not demonstrate more  significant improvement secondary to illness of URI over the past 2 weeks. Patient continues with decreased muscular strength, endurance, and fall risk as indicated by decreased 10mwt and 5xsts scores and patient will benefit from further skilled therapy to decrease fall risk.    Rehab Potential Fair   Clinical Impairments Affecting Rehab Potential (+) highly motivated, family support (-) age, chronicity of weakness   PT Frequency 2x / week   PT Duration 6 weeks   PT Treatment/Interventions Passive range of motion;Manual techniques;Electrical Stimulation;Cryotherapy;Balance training;Therapeutic exercise;Therapeutic activities;Stair training;Neuromuscular re-education;Patient/family education   PT Next Visit Plan Muscular strengthening, endurance, standing balance, gait training   PT Home Exercise Plan seated clamshells YTB, Sit to stand, ball squeeze/glute squeeze   Consulted and Agree with Plan of Care Patient      Patient will benefit from skilled therapeutic intervention in order to improve the following deficits and impairments:  Decreased balance, Difficulty walking, Decreased safety awareness, Decreased endurance, Decreased strength, Hypomobility, Impaired UE functional use, Decreased activity tolerance, Decreased mobility, Postural dysfunction, Abnormal gait, Decreased coordination  Visit Diagnosis: Difficulty in walking  Muscle weakness (generalized)     Problem List There are no active problems to display for this patient.   Myrene GalasWesley Linna Thebeau, PT DPT  05/09/2016, 3:51 PM  [facility] [address] [place], [place], [address] Phone: [phone]   Fax:  [phone]  Name: Alyssa FifeJanie M Frayne MRN: [id number] Date of Birth: 12/10/1933

## 2016-05-11 ENCOUNTER — Ambulatory Visit: Payer: PPO

## 2016-05-17 ENCOUNTER — Ambulatory Visit: Payer: PPO | Attending: Family Medicine

## 2016-05-17 DIAGNOSIS — R262 Difficulty in walking, not elsewhere classified: Secondary | ICD-10-CM | POA: Insufficient documentation

## 2016-05-17 DIAGNOSIS — M6281 Muscle weakness (generalized): Secondary | ICD-10-CM | POA: Insufficient documentation

## 2016-05-18 NOTE — Therapy (Signed)
[facility] [facility] [address] [place], [place], [address] Phone: [phone]   Fax:  [phone]  Physical Therapy Treatment  Patient Details  Name: Alyssa Adams MRN: [id number] Date of Birth: 06/22/1933 Referring Provider: Jerl MinaJames Hedrick MD  Encounter Date: 05/17/2016      PT End of Session - 05/18/16 0943    Visit Number 13   Number of Visits 24   Date for PT Re-Evaluation 06/20/16   Authorization Type 3/10 G code   PT Start Time 1600   PT Stop Time 1645   PT Time Calculation (min) 45 min   Equipment Utilized During Treatment Gait belt   Activity Tolerance Patient tolerated treatment well;No increased pain   Behavior During Therapy WFL for tasks assessed/performed      Past Medical History:  Diagnosis Date  . Depression   . GERD (gastroesophageal reflux disease)   . Hypertension   . Stroke (cerebrum) [facility](HCC)     Past Surgical History:  Procedure Laterality Date  . ABDOMINAL HYSTERECTOMY    . GALLBLADDER SURGERY      There were no vitals filed for this visit.      Subjective Assessment - 05/17/16 1610    Subjective Patient reports increased difficulty with stepping over the curb staying she panics when trying to perform steps. Patient states difficulty with amb and feels like she isnt making any progress   Pertinent History  CVA in 2006; Received PT in May 2015, progressive weakness since discharge secondary to not performing HEP and decreased activity   Limitations Lifting;Walking;Standing   How long can you stand comfortably? 20min   How long can you walk comfortably? 15 min   Patient Stated Goals Increase balance, walking, going where she wants to travel   Currently in Pain? No/denies   Pain Onset Today      TREATMENT: Nustep level 2 - 7 min for warm up before therapy Steps onto the treadmill forward with FWW with walker on high step - x5  Steps onto the treadmill backward with FWW with walker on  ground - x 5 Forward amb with FWW and cueing on heel strike and increasing stride length - 3730ft x 2  Side stepping over half foam and cues on step length - 430ft x 2 B with HHA from therapist Forward amb with FWW with forward stepping over half foam - 1130ft x 2        PT Education - 05/17/16 1705    Education provided Yes   Education Details HEP: ambulating with FWW versus rollator   Person(s) Educated Patient   Methods Explanation;Demonstration   Comprehension Verbalized understanding;Returned demonstration             PT Long Term Goals - 05/09/16 1443      PT LONG TERM GOAL #1   Title Patient will score a 30/80 on the LEFS to demonstrate significant improvement in LE function and ability to perform squatting activities   Baseline LEFS: 11/80 04/12/16: 27/80 05/09/16: 32/80   Time 6   Period Weeks   Status On-going     PT LONG TERM GOAL #2   Title Patient will complete a TUG test in < 20 seconds without an AD for independent mobility and decreased fall risk.   Baseline TUG: 70sec 04/12/16: 45sec 05/09/16: 42sec   Time 6   Period Weeks   Status On-going     PT LONG TERM GOAL #3   Title Patient will complete  five times sit to stand test in < 16 seconds indicating an increased LE strength and improved balance.   Baseline 5xSTS: 21.5sec 04/12/16: 15.5 sec 05/09/16: 15.5   Status Achieved     PT LONG TERM GOAL #4   Title Patient will improve gait speed to >.8 m/s to demonstrate significant improvement in dynamic balance and decreased in fall risk.    Baseline gait speed: .22222m/s 04/12/16: .33078m/s 05/09/16: .44232m/s   Status On-going               Plan - 05/17/16 0948    Clinical Impression Statement Focused on performing steps and recreating transferring up curbs. Patient demonstrates ability to perform with supervision assist after performing with minA for the first 3 reps. Patient demonstrates decreased confidence throughout therapy session and requires verbal  encouragement to perform. Patient will benefit from further skilled therapy to decrease fall risk.    Rehab Potential Fair   Clinical Impairments Affecting Rehab Potential (+) highly motivated, family support (-) age, chronicity of weakness   PT Frequency 2x / week   PT Duration 6 weeks   PT Treatment/Interventions Passive range of motion;Manual techniques;Electrical Stimulation;Cryotherapy;Balance training;Therapeutic exercise;Therapeutic activities;Stair training;Neuromuscular re-education;Patient/family education   PT Next Visit Plan Muscular strengthening, endurance, standing balance, gait training   PT Home Exercise Plan seated clamshells YTB, Sit to stand, ball squeeze/glute squeeze   Consulted and Agree with Plan of Care Patient      Patient will benefit from skilled therapeutic intervention in order to improve the following deficits and impairments:  Decreased balance, Difficulty walking, Decreased safety awareness, Decreased endurance, Decreased strength, Hypomobility, Impaired UE functional use, Decreased activity tolerance, Decreased mobility, Postural dysfunction, Abnormal gait, Decreased coordination  Visit Diagnosis: Difficulty in walking  Muscle weakness (generalized)     Problem List There are no active problems to display for this patient.   Myrene GalasWesley Elmer Merwin, PT DPT 05/18/2016, 9:52 AM  [place] [facility] [address] [place], [place], [address] Phone: [phone]   Fax:  [phone]  Name: Alyssa Adams MRN: [id number] Date of Birth: 09/03/1933

## 2016-05-24 ENCOUNTER — Ambulatory Visit: Payer: PPO

## 2016-06-07 ENCOUNTER — Ambulatory Visit: Payer: PPO

## 2016-07-21 DIAGNOSIS — H353222 Exudative age-related macular degeneration, left eye, with inactive choroidal neovascularization: Secondary | ICD-10-CM | POA: Diagnosis not present

## 2016-08-03 DIAGNOSIS — K9089 Other intestinal malabsorption: Secondary | ICD-10-CM | POA: Diagnosis not present

## 2016-08-10 DIAGNOSIS — I10 Essential (primary) hypertension: Secondary | ICD-10-CM | POA: Diagnosis not present

## 2016-08-10 DIAGNOSIS — I679 Cerebrovascular disease, unspecified: Secondary | ICD-10-CM | POA: Diagnosis not present

## 2016-08-10 DIAGNOSIS — M542 Cervicalgia: Secondary | ICD-10-CM | POA: Diagnosis not present

## 2016-08-10 DIAGNOSIS — R29898 Other symptoms and signs involving the musculoskeletal system: Secondary | ICD-10-CM | POA: Diagnosis not present

## 2016-10-20 DIAGNOSIS — H353221 Exudative age-related macular degeneration, left eye, with active choroidal neovascularization: Secondary | ICD-10-CM | POA: Diagnosis not present

## 2017-01-19 DIAGNOSIS — H353221 Exudative age-related macular degeneration, left eye, with active choroidal neovascularization: Secondary | ICD-10-CM | POA: Diagnosis not present

## 2017-02-14 DIAGNOSIS — R5383 Other fatigue: Secondary | ICD-10-CM | POA: Diagnosis not present

## 2017-02-14 DIAGNOSIS — I10 Essential (primary) hypertension: Secondary | ICD-10-CM | POA: Diagnosis not present

## 2017-02-14 DIAGNOSIS — F329 Major depressive disorder, single episode, unspecified: Secondary | ICD-10-CM | POA: Diagnosis not present

## 2017-02-14 DIAGNOSIS — I679 Cerebrovascular disease, unspecified: Secondary | ICD-10-CM | POA: Diagnosis not present

## 2017-02-14 DIAGNOSIS — R5381 Other malaise: Secondary | ICD-10-CM | POA: Diagnosis not present

## 2017-02-14 DIAGNOSIS — E538 Deficiency of other specified B group vitamins: Secondary | ICD-10-CM | POA: Diagnosis not present

## 2017-02-14 DIAGNOSIS — E876 Hypokalemia: Secondary | ICD-10-CM | POA: Diagnosis not present

## 2017-03-02 DIAGNOSIS — E538 Deficiency of other specified B group vitamins: Secondary | ICD-10-CM | POA: Diagnosis not present

## 2017-03-09 DIAGNOSIS — E538 Deficiency of other specified B group vitamins: Secondary | ICD-10-CM | POA: Diagnosis not present

## 2017-03-19 DIAGNOSIS — E538 Deficiency of other specified B group vitamins: Secondary | ICD-10-CM | POA: Diagnosis not present

## 2017-03-26 DIAGNOSIS — E538 Deficiency of other specified B group vitamins: Secondary | ICD-10-CM | POA: Diagnosis not present

## 2017-04-17 DIAGNOSIS — H353113 Nonexudative age-related macular degeneration, right eye, advanced atrophic without subfoveal involvement: Secondary | ICD-10-CM | POA: Diagnosis not present

## 2017-04-17 DIAGNOSIS — H353221 Exudative age-related macular degeneration, left eye, with active choroidal neovascularization: Secondary | ICD-10-CM | POA: Diagnosis not present

## 2017-07-17 DIAGNOSIS — H353211 Exudative age-related macular degeneration, right eye, with active choroidal neovascularization: Secondary | ICD-10-CM | POA: Diagnosis not present

## 2017-07-17 DIAGNOSIS — H353221 Exudative age-related macular degeneration, left eye, with active choroidal neovascularization: Secondary | ICD-10-CM | POA: Diagnosis not present

## 2017-10-29 DIAGNOSIS — H353221 Exudative age-related macular degeneration, left eye, with active choroidal neovascularization: Secondary | ICD-10-CM | POA: Diagnosis not present

## 2018-02-18 DIAGNOSIS — H353221 Exudative age-related macular degeneration, left eye, with active choroidal neovascularization: Secondary | ICD-10-CM | POA: Diagnosis not present

## 2018-05-20 DIAGNOSIS — Z23 Encounter for immunization: Secondary | ICD-10-CM | POA: Diagnosis not present

## 2018-05-27 DIAGNOSIS — H353221 Exudative age-related macular degeneration, left eye, with active choroidal neovascularization: Secondary | ICD-10-CM | POA: Diagnosis not present

## 2018-06-18 DIAGNOSIS — K9089 Other intestinal malabsorption: Secondary | ICD-10-CM | POA: Diagnosis not present

## 2018-06-18 DIAGNOSIS — R110 Nausea: Secondary | ICD-10-CM | POA: Diagnosis not present

## 2018-09-10 ENCOUNTER — Emergency Department: Payer: PPO

## 2018-09-10 ENCOUNTER — Other Ambulatory Visit: Payer: Self-pay

## 2018-09-10 ENCOUNTER — Emergency Department
Admission: EM | Admit: 2018-09-10 | Discharge: 2018-09-10 | Disposition: A | Discharge status: Home / Self Care | Payer: PPO | Attending: Student in an Organized Health Care Education/Training Program | Admitting: Student in an Organized Health Care Education/Training Program

## 2018-09-10 ENCOUNTER — Encounter: Payer: Self-pay | Admitting: Emergency Medicine

## 2018-09-10 DIAGNOSIS — Y999 Unspecified external cause status: Secondary | ICD-10-CM | POA: Insufficient documentation

## 2018-09-10 DIAGNOSIS — W19XXXA Unspecified fall, initial encounter: Secondary | ICD-10-CM | POA: Diagnosis not present

## 2018-09-10 DIAGNOSIS — Y9389 Activity, other specified: Secondary | ICD-10-CM | POA: Diagnosis not present

## 2018-09-10 DIAGNOSIS — W109XXA Fall (on) (from) unspecified stairs and steps, initial encounter: Secondary | ICD-10-CM | POA: Insufficient documentation

## 2018-09-10 DIAGNOSIS — M545 Low back pain, unspecified: Secondary | ICD-10-CM

## 2018-09-10 DIAGNOSIS — S3992XA Unspecified injury of lower back, initial encounter: Secondary | ICD-10-CM | POA: Diagnosis not present

## 2018-09-10 DIAGNOSIS — R52 Pain, unspecified: Secondary | ICD-10-CM | POA: Diagnosis not present

## 2018-09-10 DIAGNOSIS — R102 Pelvic and perineal pain: Secondary | ICD-10-CM | POA: Diagnosis not present

## 2018-09-10 DIAGNOSIS — Y92009 Unspecified place in unspecified non-institutional (private) residence as the place of occurrence of the external cause: Secondary | ICD-10-CM | POA: Diagnosis not present

## 2018-09-10 DIAGNOSIS — Y9301 Activity, walking, marching and hiking: Secondary | ICD-10-CM | POA: Insufficient documentation

## 2018-09-10 DIAGNOSIS — S3993XA Unspecified injury of pelvis, initial encounter: Secondary | ICD-10-CM | POA: Diagnosis not present

## 2018-09-10 DIAGNOSIS — I10 Essential (primary) hypertension: Secondary | ICD-10-CM | POA: Insufficient documentation

## 2018-09-10 DIAGNOSIS — M5489 Other dorsalgia: Secondary | ICD-10-CM | POA: Diagnosis not present

## 2018-09-10 DIAGNOSIS — W108XXA Fall (on) (from) other stairs and steps, initial encounter: Secondary | ICD-10-CM | POA: Diagnosis not present

## 2018-09-10 DIAGNOSIS — Z79899 Other long term (current) drug therapy: Secondary | ICD-10-CM | POA: Insufficient documentation

## 2018-09-10 DIAGNOSIS — Y92008 Other place in unspecified non-institutional (private) residence as the place of occurrence of the external cause: Secondary | ICD-10-CM | POA: Diagnosis not present

## 2018-09-10 MED ORDER — LIDOCAINE 5 % EX PTCH: 1.0000 | MEDICATED_PATCH | Freq: Two times a day (BID) | CUTANEOUS | 0 refills | Status: AC

## 2018-09-10 MED ORDER — TRAMADOL HCL 50 MG PO TABS
50.0000 mg | ORAL_TABLET | Freq: Once | ORAL | Status: AC
  Administered 2018-09-10: 50 mg via ORAL
  Filled 2018-09-10: qty 1

## 2018-09-10 MED ORDER — ACETAMINOPHEN 500 MG PO TABS
1000.0000 mg | ORAL_TABLET | Freq: Once | ORAL | Status: AC
  Administered 2018-09-10: 1000 mg via ORAL
  Filled 2018-09-10: qty 2

## 2018-09-10 MED ORDER — LIDOCAINE 5 % EX PTCH
1.0000 | MEDICATED_PATCH | CUTANEOUS | Status: DC
  Administered 2018-09-10: 1 via TRANSDERMAL
  Filled 2018-09-10: qty 1

## 2018-09-10 NOTE — ED Triage Notes (Signed)
Patient presents to ED via [facility] from home post mechanical fall. Patient tripped and fell this morning. Patient reports middle lower back pain/tail bone pain. Right leg noted to have internal rotation. Patient is able to move toes bilaterally. A&O x4. GCS 15. Denies being on blood thinners.

## 2018-09-10 NOTE — ED Notes (Signed)
Patient transported to radiology

## 2018-09-10 NOTE — Discharge Instructions (Signed)

## 2018-09-10 NOTE — ED Provider Notes (Signed)
[facility] Emergency Department Provider Note    First MD Initiated Contact with Patient 09/10/18 1031     (approximate)  I have reviewed the triage vital signs and the nursing notes.   HISTORY  Chief Complaint Fall    HPI Alyssa Adams is a 85 y.o. female below listed past medical history not any blood thinners presents the ER after mechanical fall.  States that she was walking down 1 step into her laundry room lost her balance fell backwards onto her low back.  Did not hit her head.  No loss of consciousness.  Denies any numbness or tingling.  Complaining of low back pain.  Is never had any previous injuries to her back.  Denies any chest pain shortness of breath, fevers, nausea or vomiting.  No sick contacts.    Past Medical History:  Diagnosis Date  . Depression   . GERD (gastroesophageal reflux disease)   . Hypertension   . Stroke (cerebrum) [facility])    Family History  Problem Relation Age of Onset  . Kidney cancer Father   . Prostate cancer Neg Hx    Past Surgical History:  Procedure Laterality Date  . ABDOMINAL HYSTERECTOMY    . GALLBLADDER SURGERY     There are no active problems to display for this patient.     Prior to Admission medications   Medication Sig Start Date End Date Taking? Authorizing Provider  amLODipine (NORVASC) 5 MG tablet TAKE 1 TABLET EVERY DAY 08/06/15   [provider]  amLODipine-atorvastatin (CADUET) 5-10 MG tablet Take 1 tablet by mouth daily.    [provider]  benzonatate (TESSALON PERLES) 100 MG capsule Take 1 capsule (100 mg total) by mouth 3 (three) times daily as needed. 04/26/16   Renford Dills, NP  cholestyramine light (PREVALITE) 4 g packet TAKE 1 PACKET BY MOUTH EVERY MORNING BEFORE BREAKFAST 02/11/16   [provider]  Cyanocobalamin (VITAMIN B-12 CR) 1000 MCG TBCR Take by mouth.    [provider]  dipyridamole-aspirin (AGGRENOX) 200-25 MG 12hr capsule TAKE ONE  CAPSULE TWICE A DAY 10/26/14   [provider]  doxycycline (VIBRAMYCIN) 100 MG capsule Take 1 capsule (100 mg total) by mouth 2 (two) times daily. 04/26/16   Renford Dills, NP  lidocaine (LIDODERM) 5 % Place 1 patch onto the skin every 12 (twelve) hours. Remove & Discard patch within 12 hours or as directed by MD 09/10/18 09/10/19  Willy Eddy, MD  metoprolol succinate (TOPROL-XL) 50 MG 24 hr tablet TAKE 1 TABLET EVERY DAY 07/13/15   [provider]  mirabegron ER (MYRBETRIQ) 50 MG TB24 tablet Take 1 tablet (50 mg total) by mouth daily. 03/17/16   Hildred Laser, MD  pantoprazole (PROTONIX) 40 MG tablet TAKE 1 TABLET EVERY DAY 07/13/15   [provider]  sucralfate (CARAFATE) 1 g tablet Take 1 g by mouth 4 (four) times daily -  with meals and at bedtime.    [provider]  venlafaxine XR (EFFEXOR-XR) 150 MG 24 hr capsule TAKE ONE CAPSULE DAILY 09/13/15   [provider]  Vitamin D, Ergocalciferol, (DRISDOL) 50000 units CAPS capsule TAKE ONE CAPSULE TWICE A WEEK 10/05/14   [provider]    Allergies Erythromycin ethylsuccinate and Sulfa antibiotics    Social History Social History   Tobacco Use  . Smoking status: Never Smoker  . Smokeless tobacco: Never Used  Substance Use Topics  . Alcohol use: No  . Drug use: Not  on file    Review of Systems Patient denies headaches, rhinorrhea, blurry vision, numbness, shortness of breath, chest pain, edema, cough, abdominal pain, nausea, vomiting, diarrhea, dysuria, fevers, rashes or hallucinations unless otherwise stated above in HPI. ____________________________________________   PHYSICAL EXAM:  VITAL SIGNS: Vitals:   09/10/18 1026  BP: (!) 190/98  Pulse: 81  Resp: 17  Temp: 97.8 F (36.6 C)  SpO2: 98%    Constitutional: Alert and oriented.  Eyes: Conjunctivae are normal.  Head: Atraumatic. Nose: No congestion/rhinnorhea. Mouth/Throat: Mucous membranes are moist.    Neck: No stridor. Painless ROM.  Cardiovascular: Normal rate, regular rhythm. Grossly normal heart sounds.  Good peripheral circulation. Respiratory: Normal respiratory effort.  No retractions. Lungs CTAB. Gastrointestinal: Soft and nontender. No distention. No abdominal bruits. No CVA tenderness. Genitourinary:  Musculoskeletal:  Hip stable and nontender.  ttp of sacrum, no step offs or deformities. No lower extremity tenderness nor edema.  No joint effusions.  N/V intact distally Neurologic:  Normal speech and language. No gross focal neurologic deficits are appreciated. No facial droop Skin:  Skin is warm, dry and intact. No rash noted. Psychiatric: Mood and affect are normal. Speech and behavior are normal.  ____________________________________________   LABS (all labs ordered are listed, but only abnormal results are displayed)  No results found for this or any previous visit (from the past 24 hour(s)). ____________________________________________ __________________________  RADIOLOGY  I personally reviewed all radiographic images ordered to evaluate for the above acute complaints and reviewed radiology reports and findings.  These findings were personally discussed with the patient.  Please see medical record for radiology report.  ____________________________________________   PROCEDURES  Procedure(s) performed:  Procedures    Critical Care performed: no ____________________________________________   INITIAL IMPRESSION / ASSESSMENT AND PLAN / ED COURSE  Pertinent labs & imaging results that were available during my care of the patient were reviewed by me and considered in my medical decision making (see chart for details).   DDX: fracture, contusion, dislocation  [name] is a 86 y.o. who presents to the ED with low back and tailbone pain as described above after mechanical fall.  No head injury.  Remainder of primary and secondary surveys reassuring.  No  open wound.  X-rays are reassuring.  No sign of neurologic deficit.  Patient able to ambulate.  Pain controlled.  Patient stable and appropriate for outpatient follow-up.      As part of my medical decision making, I reviewed the following data within the electronic MEDICAL RECORD NUMBER Nursing notes reviewed and incorporated, Labs reviewed, notes from prior ED visits and [place] Controlled Substance Database   ____________________________________________   FINAL CLINICAL IMPRESSION(S) / ED DIAGNOSES  Final diagnoses:  Fall, initial encounter  Acute midline low back pain without sciatica      NEW MEDICATIONS STARTED DURING THIS VISIT:  New Prescriptions   LIDOCAINE (LIDODERM) 5 %    Place 1 patch onto the skin every 12 (twelve) hours. Remove & Discard patch within 12 hours or as directed by MD     Note:  This document was prepared using Dragon voice recognition software and may include unintentional dictation errors.    Willy Eddy, MD 09/10/18 1128

## 2019-04-15 DIAGNOSIS — I10 Essential (primary) hypertension: Secondary | ICD-10-CM | POA: Diagnosis not present

## 2019-04-15 DIAGNOSIS — E559 Vitamin D deficiency, unspecified: Secondary | ICD-10-CM | POA: Diagnosis not present

## 2019-04-15 DIAGNOSIS — E785 Hyperlipidemia, unspecified: Secondary | ICD-10-CM | POA: Diagnosis not present

## 2019-04-15 DIAGNOSIS — Z0000 Encounter for general adult medical examination without abnormal findings: Secondary | ICD-10-CM | POA: Diagnosis not present

## 2019-04-15 DIAGNOSIS — Z23 Encounter for immunization: Secondary | ICD-10-CM | POA: Diagnosis not present

## 2019-04-15 DIAGNOSIS — F329 Major depressive disorder, single episode, unspecified: Secondary | ICD-10-CM | POA: Diagnosis not present

## 2019-04-15 DIAGNOSIS — G8191 Hemiplegia, unspecified affecting right dominant side: Secondary | ICD-10-CM | POA: Diagnosis not present

## 2019-04-17 DIAGNOSIS — I10 Essential (primary) hypertension: Secondary | ICD-10-CM | POA: Diagnosis not present

## 2019-04-17 DIAGNOSIS — E559 Vitamin D deficiency, unspecified: Secondary | ICD-10-CM | POA: Diagnosis not present

## 2019-04-17 DIAGNOSIS — E785 Hyperlipidemia, unspecified: Secondary | ICD-10-CM | POA: Diagnosis not present

## 2019-04-18 DIAGNOSIS — E785 Hyperlipidemia, unspecified: Secondary | ICD-10-CM | POA: Diagnosis not present

## 2019-04-18 DIAGNOSIS — I10 Essential (primary) hypertension: Secondary | ICD-10-CM | POA: Diagnosis not present

## 2019-07-17 DIAGNOSIS — G8191 Hemiplegia, unspecified affecting right dominant side: Secondary | ICD-10-CM | POA: Diagnosis not present

## 2019-07-17 DIAGNOSIS — E785 Hyperlipidemia, unspecified: Secondary | ICD-10-CM | POA: Diagnosis not present

## 2019-07-17 DIAGNOSIS — E876 Hypokalemia: Secondary | ICD-10-CM | POA: Diagnosis not present

## 2019-07-17 DIAGNOSIS — I10 Essential (primary) hypertension: Secondary | ICD-10-CM | POA: Diagnosis not present

## 2019-07-17 DIAGNOSIS — E559 Vitamin D deficiency, unspecified: Secondary | ICD-10-CM | POA: Diagnosis not present

## 2019-09-18 DIAGNOSIS — K9089 Other intestinal malabsorption: Secondary | ICD-10-CM | POA: Diagnosis not present

## 2019-09-30 ENCOUNTER — Observation Stay (HOSPITAL_COMMUNITY): Payer: PPO

## 2019-09-30 ENCOUNTER — Emergency Department (HOSPITAL_COMMUNITY): Payer: PPO

## 2019-09-30 ENCOUNTER — Inpatient Hospital Stay (HOSPITAL_COMMUNITY)
Admission: EM | Admit: 2019-09-30 | Discharge: 2019-10-06 | DRG: 065 | Disposition: A | Discharge status: Skilled Nursing Facility | Payer: PPO | Attending: Internal Medicine | Admitting: Internal Medicine

## 2019-09-30 ENCOUNTER — Other Ambulatory Visit: Payer: Self-pay

## 2019-09-30 DIAGNOSIS — I10 Essential (primary) hypertension: Secondary | ICD-10-CM | POA: Diagnosis not present

## 2019-09-30 DIAGNOSIS — R29810 Facial weakness: Secondary | ICD-10-CM | POA: Diagnosis not present

## 2019-09-30 DIAGNOSIS — R4701 Aphasia: Secondary | ICD-10-CM | POA: Diagnosis not present

## 2019-09-30 DIAGNOSIS — I6523 Occlusion and stenosis of bilateral carotid arteries: Secondary | ICD-10-CM | POA: Diagnosis present

## 2019-09-30 DIAGNOSIS — R4182 Altered mental status, unspecified: Secondary | ICD-10-CM | POA: Diagnosis not present

## 2019-09-30 DIAGNOSIS — R29818 Other symptoms and signs involving the nervous system: Secondary | ICD-10-CM | POA: Diagnosis not present

## 2019-09-30 DIAGNOSIS — Z20822 Contact with and (suspected) exposure to covid-19: Secondary | ICD-10-CM | POA: Diagnosis not present

## 2019-09-30 DIAGNOSIS — Z66 Do not resuscitate: Secondary | ICD-10-CM | POA: Diagnosis present

## 2019-09-30 DIAGNOSIS — R2990 Unspecified symptoms and signs involving the nervous system: Secondary | ICD-10-CM | POA: Diagnosis not present

## 2019-09-30 DIAGNOSIS — E785 Hyperlipidemia, unspecified: Secondary | ICD-10-CM | POA: Diagnosis present

## 2019-09-30 DIAGNOSIS — K219 Gastro-esophageal reflux disease without esophagitis: Secondary | ICD-10-CM

## 2019-09-30 DIAGNOSIS — R29704 NIHSS score 4: Secondary | ICD-10-CM | POA: Diagnosis present

## 2019-09-30 DIAGNOSIS — I69328 Other speech and language deficits following cerebral infarction: Secondary | ICD-10-CM | POA: Diagnosis not present

## 2019-09-30 DIAGNOSIS — I63332 Cerebral infarction due to thrombosis of left posterior cerebral artery: Secondary | ICD-10-CM | POA: Diagnosis not present

## 2019-09-30 DIAGNOSIS — R531 Weakness: Secondary | ICD-10-CM | POA: Diagnosis not present

## 2019-09-30 DIAGNOSIS — I6501 Occlusion and stenosis of right vertebral artery: Secondary | ICD-10-CM | POA: Diagnosis present

## 2019-09-30 DIAGNOSIS — Z8051 Family history of malignant neoplasm of kidney: Secondary | ICD-10-CM

## 2019-09-30 DIAGNOSIS — R404 Transient alteration of awareness: Secondary | ICD-10-CM | POA: Diagnosis not present

## 2019-09-30 DIAGNOSIS — F32A Depression, unspecified: Secondary | ICD-10-CM

## 2019-09-30 DIAGNOSIS — I6389 Other cerebral infarction: Secondary | ICD-10-CM | POA: Diagnosis not present

## 2019-09-30 DIAGNOSIS — R1312 Dysphagia, oropharyngeal phase: Secondary | ICD-10-CM | POA: Diagnosis not present

## 2019-09-30 DIAGNOSIS — Z03818 Encounter for observation for suspected exposure to other biological agents ruled out: Secondary | ICD-10-CM | POA: Diagnosis not present

## 2019-09-30 DIAGNOSIS — I69351 Hemiplegia and hemiparesis following cerebral infarction affecting right dominant side: Secondary | ICD-10-CM

## 2019-09-30 DIAGNOSIS — R0902 Hypoxemia: Secondary | ICD-10-CM | POA: Diagnosis not present

## 2019-09-30 DIAGNOSIS — Z8673 Personal history of transient ischemic attack (TIA), and cerebral infarction without residual deficits: Secondary | ICD-10-CM

## 2019-09-30 DIAGNOSIS — I6782 Cerebral ischemia: Secondary | ICD-10-CM | POA: Diagnosis not present

## 2019-09-30 DIAGNOSIS — F320 Major depressive disorder, single episode, mild: Secondary | ICD-10-CM | POA: Diagnosis not present

## 2019-09-30 DIAGNOSIS — F329 Major depressive disorder, single episode, unspecified: Secondary | ICD-10-CM | POA: Diagnosis not present

## 2019-09-30 DIAGNOSIS — R2681 Unsteadiness on feet: Secondary | ICD-10-CM | POA: Diagnosis not present

## 2019-09-30 DIAGNOSIS — R4781 Slurred speech: Secondary | ICD-10-CM

## 2019-09-30 DIAGNOSIS — I161 Hypertensive emergency: Secondary | ICD-10-CM

## 2019-09-30 DIAGNOSIS — E876 Hypokalemia: Secondary | ICD-10-CM | POA: Diagnosis not present

## 2019-09-30 DIAGNOSIS — M24541 Contracture, right hand: Secondary | ICD-10-CM | POA: Diagnosis not present

## 2019-09-30 DIAGNOSIS — E559 Vitamin D deficiency, unspecified: Secondary | ICD-10-CM | POA: Diagnosis not present

## 2019-09-30 DIAGNOSIS — R41841 Cognitive communication deficit: Secondary | ICD-10-CM | POA: Diagnosis not present

## 2019-09-30 LAB — DIFFERENTIAL
Abs Immature Granulocytes: 0.03 10*3/uL (ref 0.00–0.07)
Basophils Absolute: 0 10*3/uL (ref 0.0–0.1)
Basophils Relative: 1 %
Eosinophils Absolute: 0.3 10*3/uL (ref 0.0–0.5)
Eosinophils Relative: 4 %
Immature Granulocytes: 0 %
Lymphocytes Relative: 29 %
Lymphs Abs: 2.2 10*3/uL (ref 0.7–4.0)
Monocytes Absolute: 0.5 10*3/uL (ref 0.1–1.0)
Monocytes Relative: 7 %
Neutro Abs: 4.5 10*3/uL (ref 1.7–7.7)
Neutrophils Relative %: 59 %

## 2019-09-30 LAB — COMPREHENSIVE METABOLIC PANEL
ALT: 15 U/L (ref 0–44)
AST: 35 U/L (ref 15–41)
Albumin: 3.8 g/dL (ref 3.5–5.0)
Alkaline Phosphatase: 69 U/L (ref 38–126)
Anion gap: 9 (ref 5–15)
BUN: 14 mg/dL (ref 8–23)
CO2: 24 mmol/L (ref 22–32)
Calcium: 9 mg/dL (ref 8.9–10.3)
Chloride: 108 mmol/L (ref 98–111)
Creatinine, Ser: 0.84 mg/dL (ref 0.44–1.00)
GFR calc Af Amer: >60 mL/min (ref >60)
GFR calc non Af Amer: >60 mL/min (ref >60)
Glucose, Bld: 113 mg/dL — ABNORMAL HIGH (ref 70–99)
Potassium: 4.8 mmol/L (ref 3.5–5.1)
Sodium: 141 mmol/L (ref 135–145)
Total Bilirubin: 1.7 mg/dL — ABNORMAL HIGH (ref 0.3–1.2)
Total Protein: 6.7 g/dL (ref 6.5–8.1)

## 2019-09-30 LAB — CBC
HCT: 41.8 % (ref 36.0–46.0)
Hemoglobin: 13.6 g/dL (ref 12.0–15.0)
MCH: 31.7 pg (ref 26.0–34.0)
MCHC: 32.5 g/dL (ref 30.0–36.0)
MCV: 97.4 fL (ref 80.0–100.0)
Platelets: 253 10*3/uL (ref 150–400)
RBC: 4.29 MIL/uL (ref 3.87–5.11)
RDW: 13.4 % (ref 11.5–15.5)
WBC: 7.6 10*3/uL (ref 4.0–10.5)
nRBC: 0 % (ref 0.0–0.2)

## 2019-09-30 LAB — RESPIRATORY PANEL BY RT PCR (FLU A&B, COVID)
Influenza A by PCR: NEGATIVE
Influenza B by PCR: NEGATIVE
SARS Coronavirus 2 by RT PCR: NEGATIVE

## 2019-09-30 LAB — I-STAT CHEM 8, ED
BUN: 21 mg/dL (ref 8–23)
Calcium, Ion: 1.12 mmol/L — ABNORMAL LOW (ref 1.15–1.40)
Chloride: 105 mmol/L (ref 98–111)
Creatinine, Ser: 0.8 mg/dL (ref 0.44–1.00)
Glucose, Bld: 108 mg/dL — ABNORMAL HIGH (ref 70–99)
HCT: 40 % (ref 36.0–46.0)
Hemoglobin: 13.6 g/dL (ref 12.0–15.0)
Potassium: 5.4 mmol/L — ABNORMAL HIGH (ref 3.5–5.1)
Sodium: 140 mmol/L (ref 135–145)
TCO2: 31 mmol/L (ref 22–32)

## 2019-09-30 LAB — CBG MONITORING, ED: Glucose-Capillary: 94 mg/dL (ref 70–99)

## 2019-09-30 MED ORDER — LABETALOL HCL 5 MG/ML IV SOLN
10.0000 mg | Freq: Once | INTRAVENOUS | Status: AC
  Administered 2019-09-30: 10 mg via INTRAVENOUS
  Filled 2019-09-30: qty 4

## 2019-09-30 MED ORDER — IOHEXOL 350 MG/ML SOLN
60.0000 mL | Freq: Once | INTRAVENOUS | Status: AC | PRN
  Administered 2019-09-30: 60 mL via INTRAVENOUS

## 2019-09-30 MED ORDER — SODIUM CHLORIDE 0.9% FLUSH
3.0000 mL | Freq: Once | INTRAVENOUS | Status: DC
Start: 2019-09-30 — End: 2019-10-06

## 2019-09-30 NOTE — ED Provider Notes (Signed)
[facility]   CSN: [id number] Arrival date & time: 09/30/19  1651     History Chief Complaint  Patient presents with  . Code Stroke    Alyssa FifeJanie M Adams is a 86 y.o. female.  HPI Patient was home and at normal baseline this morning.  Between 11 AM and 12 PM her sitter noticed that she seemed to be having trouble with her television remote.  At about 1 PM she seemed to have left-sided weakness.  At baseline patient has significant right-sided weakness and contracture of the right upper extremity from old stroke but has good function on the left and is typically normal conversation with some short-term memory loss issues.  This was new for the patient and her daughter called for EMS.  Patient arrived as code stroke status.  Patient reports that she recalls having difficulty with her speech.  The time of my evaluation, speech is back to normal.  Patient reports now she does not have any new symptoms.  She continues to have her chronic right-sided weakness from her old stroke but denies other new symptoms.    Past Medical History:  Diagnosis Date  . Depression   . GERD (gastroesophageal reflux disease)   . Hypertension   . Stroke (cerebrum) [facility](HCC)     Patient Active Problem List   Diagnosis Date Noted  . Hypertensive emergency 09/30/2019    Past Surgical History:  Procedure Laterality Date  . ABDOMINAL HYSTERECTOMY    . GALLBLADDER SURGERY       OB History   No obstetric history on file.     Family History  Problem Relation Age of Onset  . Kidney cancer Father   . Prostate cancer Neg Hx     Social History   Tobacco Use  . Smoking status: Never Smoker  . Smokeless tobacco: Never Used  Substance Use Topics  . Alcohol use: No  . Drug use: Not on file    Home Medications Prior to Admission medications   Medication Sig Start Date End Date Taking? Authorizing Provider  cholestyramine light (PREVALITE) 4 g packet  Take 4 g by mouth daily before breakfast.  02/11/16  Yes [provider]  Vitamin D, Ergocalciferol, (DRISDOL) 1.25 MG (50000 UNIT) CAPS capsule Take 50,000 Units by mouth every 7 (seven) days.   Yes [provider]  benzonatate (TESSALON PERLES) 100 MG capsule Take 1 capsule (100 mg total) by mouth 3 (three) times daily as needed. Patient not taking: Reported on 09/30/2019 04/26/16   Renford DillsMiller, Lindsey, NP  doxycycline (VIBRAMYCIN) 100 MG capsule Take 1 capsule (100 mg total) by mouth 2 (two) times daily. Patient not taking: Reported on 09/30/2019 04/26/16   Renford DillsMiller, Lindsey, NP  mirabegron ER (MYRBETRIQ) 50 MG TB24 tablet Take 1 tablet (50 mg total) by mouth daily. Patient not taking: Reported on 09/30/2019 03/17/16   Hildred LaserBudzyn, Brian James, MD    Allergies    Erythromycin ethylsuccinate and Sulfa antibiotics  Review of Systems   Review of Systems 10 Systems reviewed and are negative for acute change except as noted in the HPI.  Physical Exam Updated Vital Signs BP (!) 165/76   Pulse 60   Resp (!) 22   Wt 69.5 kg   SpO2 96%   BMI 28.02 kg/m   Physical Exam Constitutional:      Comments: Alert and appropriate.  No respiratory distress.  Well-nourished well-developed.  HENT:     Nose: Nose normal.  Mouth/Throat:     Mouth: Mucous membranes are moist.     Pharynx: Oropharynx is clear.  Eyes:     Extraocular Movements: Extraocular movements intact.     Conjunctiva/sclera: Conjunctivae normal.     Pupils: Pupils are equal, round, and reactive to light.  Cardiovascular:     Rate and Rhythm: Normal rate and regular rhythm.  Pulmonary:     Effort: Pulmonary effort is normal.     Breath sounds: Normal breath sounds.  Abdominal:     General: There is no distension.     Palpations: Abdomen is soft.     Tenderness: There is no abdominal tenderness. There is no guarding.  Musculoskeletal:     Cervical back: Neck supple.     Comments: Patient has significant contractures  right upper extremity.  No significant edema of the lower extremities.  Calves soft nontender.  Skin condition good.  Skin:    General: Skin is warm and dry.  Neurological:     Comments: Patient is alert and speech is clear.  No evidence of aphasia.  Patient follows commands appropriately.  Patient has pre-existing contracture of the right upper extremity.  She can perform grip strength and movements of the left upper extremity.  She can elevate each lower extremity off of the bed and hold.  Psychiatric:        Mood and Affect: Mood normal.     ED Results / Procedures / Treatments   Labs (all labs ordered are listed, but only abnormal results are displayed) Labs Reviewed  COMPREHENSIVE METABOLIC PANEL - Abnormal; Notable for the following components:      Result Value   Glucose, Bld 113 (*)    Total Bilirubin 1.7 (*)    All other components within normal limits  I-STAT CHEM 8, ED - Abnormal; Notable for the following components:   Potassium 5.4 (*)    Glucose, Bld 108 (*)    Calcium, Ion 1.12 (*)    All other components within normal limits  RESPIRATORY PANEL BY RT PCR (FLU A&B, COVID)  CBC  DIFFERENTIAL  PROTIME-INR  APTT  CBG MONITORING, ED  CBG MONITORING, ED    EKG EKG Interpretation  Date/Time:  Tuesday September 30 2019 17:28:57 EDT Ventricular Rate:  65 PR Interval:    QRS Duration: 89 QT Interval:  425 QTC Calculation: 442 R Axis:   71 Text Interpretation: Sinus rhythm Anteroseptal infarct, old no change from old Confirmed by Charlesetta Shanks [phone]) on 09/30/2019 10:08:55 PM   Radiology CT Code Stroke CTA Head W/WO contrast  Result Date: 09/30/2019 CLINICAL DATA:  Altered mental status.  Weakness. EXAM: CT ANGIOGRAPHY HEAD AND NECK TECHNIQUE: Multidetector CT imaging of the head and neck was performed using the standard protocol during bolus administration of intravenous contrast. Multiplanar CT image reconstructions and MIPs were obtained to evaluate the vascular  anatomy. Carotid stenosis measurements (when applicable) are obtained utilizing NASCET criteria, using the distal internal carotid diameter as the denominator. CONTRAST:  25mL OMNIPAQUE IOHEXOL 350 MG/ML SOLN COMPARISON:  None. FINDINGS: CTA NECK FINDINGS Aortic arch: Standard 3 vessel aortic arch with minimal atherosclerotic plaque. Widely patent arch vessel origins. Right carotid system: Patent with a moderate amount of predominantly calcified plaque at the carotid bifurcation. No evidence of significant stenosis or dissection. Tortuous proximal common carotid artery. Left carotid system: Patent with extensive calcified plaque at the carotid bifurcation resulting in less than 50% narrowing of the ICA origin. Tortuous proximal common carotid artery. Vertebral arteries:  Patent and codominant without evidence of stenosis or dissection. Skeleton: Moderate diffuse cervical disc degeneration. Other neck: No evidence of cervical lymphadenopathy or mass. Upper chest: Clear lung apices. Review of the MIP images confirms the above findings CTA HEAD FINDINGS Anterior circulation: The internal carotid arteries are patent from skull base to carotid termini. Calcified plaque in the cavernous and proximal supraclinoid segments bilaterally does not result in significant stenosis. There is a punctate calcification at the left ICA terminus, favored to be chronic as it was likely faintly visible on a 2006 head CT. This likely results in moderate left MCA origin stenosis and potentially severe left ACA origin stenosis. The left A1 segment is hypoplastic, and the left ACA is primarily supplied from the right via the anterior communicating artery. The right A1 segment is widely patent. Both MCAs are patent without evidence of proximal branch occlusion. There are mild left M1 and proximal right M2 stenoses. No aneurysm is identified. Posterior circulation: The intracranial vertebral arteries are patent to the basilar with calcified  plaque resulting in mild proximal right V4 stenosis. Patent PICAs and SCAs are seen bilaterally. The basilar artery is patent and small in caliber congenitally. There are large posterior communicating arteries with hypoplastic right and absent left posterior communicating arteries. The PCAs are patent with branch vessel irregularity but no significant proximal stenosis. No aneurysm is identified. Venous sinuses: Poorly evaluated due to contrast timing. Anatomic variants: Predominantly fetal origin of the PCAs. Review of the MIP images confirms the above findings IMPRESSION: 1. No large vessel occlusion. 2. Suspected chronic calcification at the left ICA terminus resulting in ACA and MCA origin stenoses. 3. Mild distal right vertebral artery stenosis. 4. Left greater than right cervical carotid artery atherosclerosis without significant stenosis. These results were communicated to Dr. Wilford Corner at 5:20 pm on 09/30/2019 by text page via the [facility] messaging system. Electronically Signed   By: Sebastian Ache M.D.   On: 09/30/2019 17:34   CT Code Stroke CTA Neck W/WO contrast  Result Date: 09/30/2019 CLINICAL DATA:  Altered mental status.  Weakness. EXAM: CT ANGIOGRAPHY HEAD AND NECK TECHNIQUE: Multidetector CT imaging of the head and neck was performed using the standard protocol during bolus administration of intravenous contrast. Multiplanar CT image reconstructions and MIPs were obtained to evaluate the vascular anatomy. Carotid stenosis measurements (when applicable) are obtained utilizing NASCET criteria, using the distal internal carotid diameter as the denominator. CONTRAST:  2mL OMNIPAQUE IOHEXOL 350 MG/ML SOLN COMPARISON:  None. FINDINGS: CTA NECK FINDINGS Aortic arch: Standard 3 vessel aortic arch with minimal atherosclerotic plaque. Widely patent arch vessel origins. Right carotid system: Patent with a moderate amount of predominantly calcified plaque at the carotid bifurcation. No evidence of significant  stenosis or dissection. Tortuous proximal common carotid artery. Left carotid system: Patent with extensive calcified plaque at the carotid bifurcation resulting in less than 50% narrowing of the ICA origin. Tortuous proximal common carotid artery. Vertebral arteries: Patent and codominant without evidence of stenosis or dissection. Skeleton: Moderate diffuse cervical disc degeneration. Other neck: No evidence of cervical lymphadenopathy or mass. Upper chest: Clear lung apices. Review of the MIP images confirms the above findings CTA HEAD FINDINGS Anterior circulation: The internal carotid arteries are patent from skull base to carotid termini. Calcified plaque in the cavernous and proximal supraclinoid segments bilaterally does not result in significant stenosis. There is a punctate calcification at the left ICA terminus, favored to be chronic as it was likely faintly visible on a 2006 head CT.  This likely results in moderate left MCA origin stenosis and potentially severe left ACA origin stenosis. The left A1 segment is hypoplastic, and the left ACA is primarily supplied from the right via the anterior communicating artery. The right A1 segment is widely patent. Both MCAs are patent without evidence of proximal branch occlusion. There are mild left M1 and proximal right M2 stenoses. No aneurysm is identified. Posterior circulation: The intracranial vertebral arteries are patent to the basilar with calcified plaque resulting in mild proximal right V4 stenosis. Patent PICAs and SCAs are seen bilaterally. The basilar artery is patent and small in caliber congenitally. There are large posterior communicating arteries with hypoplastic right and absent left posterior communicating arteries. The PCAs are patent with branch vessel irregularity but no significant proximal stenosis. No aneurysm is identified. Venous sinuses: Poorly evaluated due to contrast timing. Anatomic variants: Predominantly fetal origin of the PCAs.  Review of the MIP images confirms the above findings IMPRESSION: 1. No large vessel occlusion. 2. Suspected chronic calcification at the left ICA terminus resulting in ACA and MCA origin stenoses. 3. Mild distal right vertebral artery stenosis. 4. Left greater than right cervical carotid artery atherosclerosis without significant stenosis. These results were communicated to Dr. Wilford Corner at 5:20 pm on 09/30/2019 by text page via the [facility] messaging system. Electronically Signed   By: Sebastian Ache M.D.   On: 09/30/2019 17:34   CT HEAD CODE STROKE WO CONTRAST  Result Date: 09/30/2019 CLINICAL DATA:  Code stroke. Possible stroke, ataxia, stroke suspected. Additional history provided: Last known well 1300 EXAM: CT HEAD WITHOUT CONTRAST TECHNIQUE: Contiguous axial images were obtained from the base of the skull through the vertex without intravenous contrast. COMPARISON:  Brain MRI 07/19/2005, brain MRI 08/01/2004, head CT 07/31/2004 FINDINGS: Brain: There is no evidence of acute intracranial hemorrhage. Ischemic changes within the left lentiform nucleus, progressed as compared to MRI 07/19/2005, but otherwise age indeterminate. Redemonstrated chronic lacunar infarct within the left corona radiata. Background advanced chronic small vessel ischemic disease and moderate generalized parenchymal atrophy, also progressed. There is no evidence of intracranial mass. No midline shift or extra-axial fluid collection. Vascular: No hyperdense vessel.  Atherosclerotic calcifications. Skull: Normal. Negative for fracture or focal lesion. Sinuses/Orbits: Visualized orbits demonstrate no acute abnormality. No significant paranasal sinus disease or mastoid effusion at the imaged levels. ASPECTS (Alberta Stroke Program Early CT Score) - Ganglionic level infarction (caudate, lentiform nuclei, internal capsule, insula, M1-M3 cortex): 6 (point subtracted for age-indeterminate ischemic changes within the left lentiform nucleus) -  Supraganglionic infarction (M4-M6 cortex): 3 Total score (0-10 with 10 being normal): 9 These results were called by telephone at the time of interpretation on 09/30/2019 at 5:14 pm to provider Dr. Wilford Corner, who verbally acknowledged these results. IMPRESSION: 1. Age-indeterminate ischemic changes within the left lentiform nucleus. Aspects 9. 2. No evidence of acute intracranial hemorrhage or acute demarcated cortical infarct. 3. Advanced chronic small vessel ischemic disease has progressed as compared to prior MRI 07/19/2005. Redemonstrated chronic lacunar infarct within the left corona radiata. 4. Moderate generalized parenchymal atrophy. Electronically Signed   By: Jackey Loge DO   On: 09/30/2019 17:17    Procedures Procedures (including critical care time) CRITICAL CARE Performed by: Arby Barrette   Total critical care time: 20  minutes  Critical care time was exclusive of separately billable procedures and treating other patients.  Critical care was necessary to treat or prevent imminent or life-threatening deterioration.  Critical care was time spent personally by me on the  following activities: development of treatment plan with patient and/or surrogate as well as nursing, discussions with consultants, evaluation of patient's response to treatment, examination of patient, obtaining history from patient or surrogate, ordering and performing treatments and interventions, ordering and review of laboratory studies, ordering and review of radiographic studies, pulse oximetry and re-evaluation of patient's condition. Medications Ordered in ED Medications  sodium chloride flush (NS) 0.9 % injection 3 mL (3 mLs Intravenous Not Given 09/30/19 1715)  iohexol (OMNIPAQUE) 350 MG/ML injection 60 mL (60 mLs Intravenous Contrast Given 09/30/19 1709)  labetalol (NORMODYNE) injection 10 mg (10 mg Intravenous Given 09/30/19 2100)    ED Course  I have reviewed the triage vital signs and the nursing  notes.  Pertinent labs & imaging results that were available during my care of the patient were reviewed by me and considered in my medical decision making (see chart for details).  Clinical Course as of Sep 30 2207  Tue Sep 30, 2019  2139 Patient given labetalol 10 mg for systolic blood pressure of 207.  This was confirmed while I was in the room, heart rate mid 70s and regular.  Patient is alert and speaking with clear sentences and situationally appropriate content.   [MP]  2140 Consult: [facility] hospitalist [facility] for admission.   [MP]    Clinical Course User Index [MP] Arby Barrette, MD   MDM Rules/Calculators/A&P                      Patient arrives as outlined above.  She has been evaluated for code stroke.  She did not meet criteria for TPA or interventional management.  Patient symptoms have significantly improved.  Her speech is now appropriate without evidence of aphasia.  She has prior stroke symptoms but the reported left-sided symptoms are resolved.  Patient was significantly hypertensive upon arrival.  Patient reports she has not been on any medications for several years.  This may also represent hypertensive emergency versus TIA.  Patient's systolic blood pressure during my portion of the exam was of 207.  Labetalol administered.  Patient will be admitted for further diagnostic evaluation and management of hypertension urgency and TIA. Final Clinical Impression(s) / ED Diagnoses Final diagnoses:  Hypertensive emergency without congestive heart failure    Rx / DC Orders ED Discharge Orders    None       Arby Barrette, MD 09/30/19 2211

## 2019-09-30 NOTE — ED Notes (Signed)
Out to MRI 

## 2019-09-30 NOTE — Code Documentation (Signed)
Stroke Response Nurse Documentation Code Documentation  Alyssa Adams is a 86 y.o. female arriving to [facility] ED via [facility] on 09/30/19 with past medical hx of stroke 2006 with residual right sided weakness and contracted arm. History of HTN and depression. Patient reports was told to take medications for blood pressure but does not take any.   Code stroke was activated by EMS. Patient from home where she was LKW at approximently 1100. Her sitter reported difficulty with the remote sometime between [phone]. Sitter called daughter around 92 when the patient developed weakness and altered mental status. Daughter came to house and called 911. BP 200/90.  Stroke team at the bedside on patient arrival. Labs drawn, CBG 94. Patient to CT with team. NIHSS 5, see documentation for details and code stroke times. Patient with right facial droop, right arm weakness, right decreased sensation and Expressive aphasia  on exam. The following imaging was completed:  CT, CTA head and neck. Patient is not a candidate for tPA due to out of the window. Care/Plan Q2 VS/mNISS; MRI. Bedside handoff with ED RN Hope.    Ferman Hamming Stroke Response RN

## 2019-09-30 NOTE — ED Notes (Signed)
Back from MRI.

## 2019-09-30 NOTE — ED Triage Notes (Signed)
Pt arrives via EMS- [facility] 11 am per dtr. Pt having trouble with the remote. Pt has hx of stroke that affected her right side- walks with a walker. Pt's sitter noticed she became confused and more weak.

## 2019-09-30 NOTE — ED Notes (Signed)
Introduced self to Pt. She was sitting up in bed and stated that she felt well.

## 2019-09-30 NOTE — H&P (Signed)
History and Physical    Alyssa Adams YYT:[id number] DOB: 02-05-1934 DOA: 09/30/2019  PCP: Jerl Mina, MD Patient coming from:  home  Chief Complaint: slurred speech and AMS  HPI: Alyssa Adams is a 86 y.o. female with medical history significant of stroke with residual right-sided weakness, HTN, GERD, depression who presented with slurred speech and altered mental status.  Patient has some short-term memory deficits and H&P is obtained by chart review, talk with the patient at bedside and her daughter over the phone.  Patient lives at home by herself and a caregiver comes during the day and her daughter stays with her at nighttime.  Patient was in her usual state of health until around 11-12 noon today.  She started to have slurred speech and confusion.  Associations included headaches.  She denies increased weakness during my interview.  Denies blurry vision, seizure activities, syncope. When her daughter came home to check on her this afternoon, she noted above symptoms and called the EMS.  Patient was brought to ED for further evaluation.  In the emergency room, her BP Max 196/80.  Labs showed potassium 4.8, total bili 1.7, nonrevealing CBC.  EKG showed a sinus rhythm with no acute changes.  Head CT showed Age-indeterminate ischemic changes within the left lentiform nucleus.  CTA head and neck showed No large vessel occlusion.  Neurology was consulted at ED.  Denies fevers, chills, chest pain, shortness of breath, cough, wheezing, nausea, vomiting, diarrhea, abdominal pain, dysuria, urinary frequency or urgency.   Review of Systems: As per HPI otherwise 10 point review of systems negative.  Review of Systems Otherwise negative except as per HPI, including: General: Denies fever, chills, night sweats or unintended weight loss. Resp: Denies cough, wheezing, shortness of breath. Cardiac: Denies chest pain, palpitations, orthopnea, paroxysmal nocturnal dyspnea. GI: Denies abdominal  pain, nausea, vomiting, diarrhea or constipation GU: Denies dysuria, frequency, hesitancy or incontinence MS: Denies muscle aches, joint pain or swelling Neuro: Denies headache,.  Positive for slurred speech and confusion.  Positive for residual right-sided weakness from previous stroke. Psych: Denies anxiety, depression, SI/HI/AVH Skin: Denies new rashes or lesions ID: Denies sick contacts, exotic exposures, travel  Past Medical History:  Diagnosis Date  . Depression   . GERD (gastroesophageal reflux disease)   . Hypertension   . Stroke (cerebrum) [facility])     Past Surgical History:  Procedure Laterality Date  . ABDOMINAL HYSTERECTOMY    . GALLBLADDER SURGERY      SOCIAL HISTORY:  reports that she has never smoked. She has never used smokeless tobacco. She reports that she does not drink alcohol. No history on file for drug.  Allergies  Allergen Reactions  . Erythromycin Ethylsuccinate Other (See Comments)    Reaction not recalled by the patient  . Sulfa Antibiotics Other (See Comments)    Reaction not recalled by the patient    FAMILY HISTORY: Family History  Problem Relation Age of Onset  . Kidney cancer Father   . Prostate cancer Neg Hx      Prior to Admission medications   Medication Sig Start Date End Date Taking? Authorizing Provider  cholestyramine light (PREVALITE) 4 g packet Take 4 g by mouth daily before breakfast.  02/11/16  Yes [provider]  Vitamin D, Ergocalciferol, (DRISDOL) 1.25 MG (50000 UNIT) CAPS capsule Take 50,000 Units by mouth every 7 (seven) days.   Yes [provider]  benzonatate (TESSALON PERLES) 100 MG capsule Take 1 capsule (100 mg total) by mouth  3 (three) times daily as needed. Patient not taking: Reported on 09/30/2019 04/26/16   Marylene Land, NP  doxycycline (VIBRAMYCIN) 100 MG capsule Take 1 capsule (100 mg total) by mouth 2 (two) times daily. Patient not taking: Reported on 09/30/2019 04/26/16   Marylene Land, NP    mirabegron ER (MYRBETRIQ) 50 MG TB24 tablet Take 1 tablet (50 mg total) by mouth daily. Patient not taking: Reported on 09/30/2019 03/17/16   Nickie Retort, MD    Physical Exam: Vitals:   09/30/19 2145 09/30/19 2200 09/30/19 2215 09/30/19 2228  BP: (!) 166/72 (!) 165/76 (!) 166/70   Pulse: 60 60 63   Resp: 14 (!) 22 15   Temp:    98.5 F (36.9 C)  TempSrc:    Oral  SpO2: 96% 96% 95%   Weight:          Constitutional: NAD, calm, comfortable Eyes: PERRL, lids and conjunctivae normal.  Acute ill-appearing ENMT: Mucous membranes are moist. Posterior pharynx clear of any exudate or lesions.Normal dentition.  Neck: normal, supple, no masses, no thyromegaly Respiratory: clear to auscultation bilaterally, no wheezing, no crackles. Normal respiratory effort. No accessory muscle use.  Cardiovascular: Regular rate and rhythm, no murmurs / rubs / gallops. No extremity edema. 2+ pedal pulses. No carotid bruits.  Abdomen: no tenderness, no masses palpated. No hepatosplenomegaly. Bowel sounds positive.  Musculoskeletal: no clubbing / cyanosis. No joint deformity upper and lower extremities. Good ROM, no contractures. Normal muscle tone.  Skin: no rashes, lesions, ulcers. No induration Neurologic: CN 2-12 grossly intact. Sensation intact, right-sided residual weakness.  Left-sided strength 5/5.   Psychiatric: Normal judgment and insight. Alert and oriented x 3. Normal mood.     Labs on Admission: I have personally reviewed following labs and imaging studies  CBC: Recent Labs  Lab 09/30/19 1653 09/30/19 1707  WBC 7.6  --   NEUTROABS 4.5  --   HGB 13.6 13.6  HCT 41.8 40.0  MCV 97.4  --   PLT 253  --    Basic Metabolic Panel: Recent Labs  Lab 09/30/19 1653 09/30/19 1707  Edwing Figley 141 140  K 4.8 5.4*  CL 108 105  CO2 24  --   GLUCOSE 113* 108*  BUN 14 21  CREATININE 0.84 0.80  CALCIUM 9.0  --    GFR: CrCl cannot be calculated (Unknown ideal weight.). Liver Function  Tests: Recent Labs  Lab 09/30/19 1653  AST 35  ALT 15  ALKPHOS 69  BILITOT 1.7*  PROT 6.7  ALBUMIN 3.8   No results for input(s): LIPASE, AMYLASE in the last 168 hours. No results for input(s): AMMONIA in the last 168 hours. Coagulation Profile: No results for input(s): INR, PROTIME in the last 168 hours. Cardiac Enzymes: No results for input(s): CKTOTAL, CKMB, CKMBINDEX, TROPONINI in the last 168 hours. BNP (last 3 results) No results for input(s): PROBNP in the last 8760 hours. HbA1C: No results for input(s): HGBA1C in the last 72 hours. CBG: Recent Labs  Lab 09/30/19 1651  GLUCAP 94   Lipid Profile: No results for input(s): CHOL, HDL, LDLCALC, TRIG, CHOLHDL, LDLDIRECT in the last 72 hours. Thyroid Function Tests: No results for input(s): TSH, T4TOTAL, FREET4, T3FREE, THYROIDAB in the last 72 hours. Anemia Panel: No results for input(s): VITAMINB12, FOLATE, FERRITIN, TIBC, IRON, RETICCTPCT in the last 72 hours. Urine analysis:    Component Value Date/Time   COLORURINE Yellow 11/19/2013 1754   APPEARANCEUR Clear 11/19/2013 1754   LABSPEC 1.018 11/19/2013 1754  PHURINE 6.0 11/19/2013 1754   GLUCOSEU Negative 11/19/2013 1754   HGBUR 1+ 11/19/2013 1754   BILIRUBINUR Negative 11/19/2013 1754   KETONESUR 1+ 11/19/2013 1754   PROTEINUR 30 mg/dL 83/66/2947 6546   NITRITE Negative 11/19/2013 1754   LEUKOCYTESUR Negative 11/19/2013 1754   Sepsis Labs: !!!!!!!!!!!!!!!!!!!!!!!!!!!!!!!!!!!!!!!!!!!! @LABRCNTIP (procalcitonin:4,lacticidven:4) ) Recent Results (from the past 240 hour(s))  Respiratory Panel by RT PCR (Flu A&B, Covid) - Nasopharyngeal Swab     Status: None   Collection Time: 09/30/19  5:32 PM   Specimen: Nasopharyngeal Swab  Result Value Ref Range Status   SARS Coronavirus 2 by RT PCR NEGATIVE NEGATIVE Final    Comment: (NOTE) SARS-CoV-2 target nucleic acids are NOT DETECTED. The SARS-CoV-2 RNA is generally detectable in upper respiratoy specimens during  the acute phase of infection. The lowest concentration of SARS-CoV-2 viral copies this assay can detect is 131 copies/mL. A negative result does not preclude SARS-Cov-2 infection and should not be used as the sole basis for treatment or other patient management decisions. A negative result may occur with  improper specimen collection/handling, submission of specimen other than nasopharyngeal swab, presence of viral mutation(s) within the areas targeted by this assay, and inadequate number of viral copies (<131 copies/mL). A negative result must be combined with clinical observations, patient history, and epidemiological information. The expected result is Negative. Fact Sheet for Patients:  10/02/19 Fact Sheet for Healthcare Providers:  https://www.moore.com/ This test is not yet ap proved or cleared by the https://www.young.biz/ FDA and  has been authorized for detection and/or diagnosis of SARS-CoV-2 by FDA under an Emergency Use Authorization (EUA). This EUA will remain  in effect (meaning this test can be used) for the duration of the COVID-19 declaration under Section 564(b)(1) of the Act, 21 U.S.C. section 360bbb-3(b)(1), unless the authorization is terminated or revoked sooner.    Influenza A by PCR NEGATIVE NEGATIVE Final   Influenza B by PCR NEGATIVE NEGATIVE Final    Comment: (NOTE) The Xpert Xpress SARS-CoV-2/FLU/RSV assay is intended as an aid in  the diagnosis of influenza from Nasopharyngeal swab specimens and  should not be used as a sole basis for treatment. Nasal washings and  aspirates are unacceptable for Xpert Xpress SARS-CoV-2/FLU/RSV  testing. Fact Sheet for Patients: [place] Fact Sheet for Healthcare Providers: https://www.moore.com/ This test is not yet approved or cleared by the https://www.young.biz/ FDA and  has been authorized for detection and/or diagnosis of  SARS-CoV-2 by  FDA under an Emergency Use Authorization (EUA). This EUA will remain  in effect (meaning this test can be used) for the duration of the  Covid-19 declaration under Section 564(b)(1) of the Act, 21  U.S.C. section 360bbb-3(b)(1), unless the authorization is  terminated or revoked. Performed at [facility], [address] [place], [place]      Radiological Exams on Admission: CT Code Stroke CTA Head W/WO contrast  Result Date: 09/30/2019 CLINICAL DATA:  Altered mental status.  Weakness. EXAM: CT ANGIOGRAPHY HEAD AND NECK TECHNIQUE: Multidetector CT imaging of the head and neck was performed using the standard protocol during bolus administration of intravenous contrast. Multiplanar CT image reconstructions and MIPs were obtained to evaluate the vascular anatomy. Carotid stenosis measurements (when applicable) are obtained utilizing NASCET criteria, using the distal internal carotid diameter as the denominator. CONTRAST:  56mL OMNIPAQUE IOHEXOL 350 MG/ML SOLN COMPARISON:  None. FINDINGS: CTA NECK FINDINGS Aortic arch: Standard 3 vessel aortic arch with minimal atherosclerotic plaque. Widely patent arch vessel origins.  Right carotid system: Patent with a moderate amount of predominantly calcified plaque at the carotid bifurcation. No evidence of significant stenosis or dissection. Tortuous proximal common carotid artery. Left carotid system: Patent with extensive calcified plaque at the carotid bifurcation resulting in less than 50% narrowing of the ICA origin. Tortuous proximal common carotid artery. Vertebral arteries: Patent and codominant without evidence of stenosis or dissection. Skeleton: Moderate diffuse cervical disc degeneration. Other neck: No evidence of cervical lymphadenopathy or mass. Upper chest: Clear lung apices. Review of the MIP images confirms the above findings CTA HEAD FINDINGS Anterior circulation: The internal carotid arteries are patent from skull  base to carotid termini. Calcified plaque in the cavernous and proximal supraclinoid segments bilaterally does not result in significant stenosis. There is a punctate calcification at the left ICA terminus, favored to be chronic as it was likely faintly visible on a 2006 head CT. This likely results in moderate left MCA origin stenosis and potentially severe left ACA origin stenosis. The left A1 segment is hypoplastic, and the left ACA is primarily supplied from the right via the anterior communicating artery. The right A1 segment is widely patent. Both MCAs are patent without evidence of proximal branch occlusion. There are mild left M1 and proximal right M2 stenoses. No aneurysm is identified. Posterior circulation: The intracranial vertebral arteries are patent to the basilar with calcified plaque resulting in mild proximal right V4 stenosis. Patent PICAs and SCAs are seen bilaterally. The basilar artery is patent and small in caliber congenitally. There are large posterior communicating arteries with hypoplastic right and absent left posterior communicating arteries. The PCAs are patent with branch vessel irregularity but no significant proximal stenosis. No aneurysm is identified. Venous sinuses: Poorly evaluated due to contrast timing. Anatomic variants: Predominantly fetal origin of the PCAs. Review of the MIP images confirms the above findings IMPRESSION: 1. No large vessel occlusion. 2. Suspected chronic calcification at the left ICA terminus resulting in ACA and MCA origin stenoses. 3. Mild distal right vertebral artery stenosis. 4. Left greater than right cervical carotid artery atherosclerosis without significant stenosis. These results were communicated to Dr. Wilford CornerArora at 5:20 pm on 09/30/2019 by text page via the [facility]. Electronically Signed   By: Sebastian AcheAllen  Grady M.D.   On: 09/30/2019 17:34   CT Code Stroke CTA Neck W/WO contrast  Result Date: 09/30/2019 CLINICAL DATA:  Altered mental  status.  Weakness. EXAM: CT ANGIOGRAPHY HEAD AND NECK TECHNIQUE: Multidetector CT imaging of the head and neck was performed using the standard protocol during bolus administration of intravenous contrast. Multiplanar CT image reconstructions and MIPs were obtained to evaluate the vascular anatomy. Carotid stenosis measurements (when applicable) are obtained utilizing NASCET criteria, using the distal internal carotid diameter as the denominator. CONTRAST:  60mL OMNIPAQUE IOHEXOL 350 MG/ML SOLN COMPARISON:  None. FINDINGS: CTA NECK FINDINGS Aortic arch: Standard 3 vessel aortic arch with minimal atherosclerotic plaque. Widely patent arch vessel origins. Right carotid system: Patent with a moderate amount of predominantly calcified plaque at the carotid bifurcation. No evidence of significant stenosis or dissection. Tortuous proximal common carotid artery. Left carotid system: Patent with extensive calcified plaque at the carotid bifurcation resulting in less than 50% narrowing of the ICA origin. Tortuous proximal common carotid artery. Vertebral arteries: Patent and codominant without evidence of stenosis or dissection. Skeleton: Moderate diffuse cervical disc degeneration. Other neck: No evidence of cervical lymphadenopathy or mass. Upper chest: Clear lung apices. Review of the MIP images confirms the above findings CTA  HEAD FINDINGS Anterior circulation: The internal carotid arteries are patent from skull base to carotid termini. Calcified plaque in the cavernous and proximal supraclinoid segments bilaterally does not result in significant stenosis. There is a punctate calcification at the left ICA terminus, favored to be chronic as it was likely faintly visible on a 2006 head CT. This likely results in moderate left MCA origin stenosis and potentially severe left ACA origin stenosis. The left A1 segment is hypoplastic, and the left ACA is primarily supplied from the right via the anterior communicating artery.  The right A1 segment is widely patent. Both MCAs are patent without evidence of proximal branch occlusion. There are mild left M1 and proximal right M2 stenoses. No aneurysm is identified. Posterior circulation: The intracranial vertebral arteries are patent to the basilar with calcified plaque resulting in mild proximal right V4 stenosis. Patent PICAs and SCAs are seen bilaterally. The basilar artery is patent and small in caliber congenitally. There are large posterior communicating arteries with hypoplastic right and absent left posterior communicating arteries. The PCAs are patent with branch vessel irregularity but no significant proximal stenosis. No aneurysm is identified. Venous sinuses: Poorly evaluated due to contrast timing. Anatomic variants: Predominantly fetal origin of the PCAs. Review of the MIP images confirms the above findings IMPRESSION: 1. No large vessel occlusion. 2. Suspected chronic calcification at the left ICA terminus resulting in ACA and MCA origin stenoses. 3. Mild distal right vertebral artery stenosis. 4. Left greater than right cervical carotid artery atherosclerosis without significant stenosis. These results were communicated to Dr. Wilford Corner at 5:20 pm on 09/30/2019 by text page via the [facility] messaging system. Electronically Signed   By: Sebastian Ache M.D.   On: 09/30/2019 17:34   CT HEAD CODE STROKE WO CONTRAST  Result Date: 09/30/2019 CLINICAL DATA:  Code stroke. Possible stroke, ataxia, stroke suspected. Additional history provided: Last known well 1300 EXAM: CT HEAD WITHOUT CONTRAST TECHNIQUE: Contiguous axial images were obtained from the base of the skull through the vertex without intravenous contrast. COMPARISON:  Brain MRI 07/19/2005, brain MRI 08/01/2004, head CT 07/31/2004 FINDINGS: Brain: There is no evidence of acute intracranial hemorrhage. Ischemic changes within the left lentiform nucleus, progressed as compared to MRI 07/19/2005, but otherwise age indeterminate.  Redemonstrated chronic lacunar infarct within the left corona radiata. Background advanced chronic small vessel ischemic disease and moderate generalized parenchymal atrophy, also progressed. There is no evidence of intracranial mass. No midline shift or extra-axial fluid collection. Vascular: No hyperdense vessel.  Atherosclerotic calcifications. Skull: Normal. Negative for fracture or focal lesion. Sinuses/Orbits: Visualized orbits demonstrate no acute abnormality. No significant paranasal sinus disease or mastoid effusion at the imaged levels. ASPECTS ([place] Stroke Program Early CT Score) - Ganglionic level infarction (caudate, lentiform nuclei, internal capsule, insula, M1-M3 cortex): 6 (point subtracted for age-indeterminate ischemic changes within the left lentiform nucleus) - Supraganglionic infarction (M4-M6 cortex): 3 Total score (0-10 with 10 being normal): 9 These results were called by telephone at the time of interpretation on 09/30/2019 at 5:14 pm to provider Dr. Wilford Corner, who verbally acknowledged these results. IMPRESSION: 1. Age-indeterminate ischemic changes within the left lentiform nucleus. Aspects 9. 2. No evidence of acute intracranial hemorrhage or acute demarcated cortical infarct. 3. Advanced chronic small vessel ischemic disease has progressed as compared to prior MRI 07/19/2005. Redemonstrated chronic lacunar infarct within the left corona radiata. 4. Moderate generalized parenchymal atrophy. Electronically Signed   By: Jackey Loge DO   On: 09/30/2019 17:17     All  images have been reviewed by me personally.  EKG: Independently reviewed.   Assessment/Plan Active Problems:   Hypertensive emergency   History of stroke in adulthood   Essential hypertension   GERD (gastroesophageal reflux disease)   Depression   #Slurred speech and confusion  #Previous CVA with residual right-sided weakness  Her slurred speech and confusion are argely resolved.  Patient is noted to have  elevated hypertension upon arrival.  The etiology of her slurred speech and confusion could be related to new stroke with pending work-up versus hypertensive emergency.  -Admit to telemetry -B12, TSH, hemoglobin A1c, lipid panel,, UA -Chest x-ray pending -CT head and CTA head and neck reviewed -MRI without contrast pending -Echo pending -PT/OT/speech consult -Neurochecks every 4 hours -Antiplatelet therapy per neurology -Neurology consulted and appreciate their input  #Hypertensive emergency #Essential hypertension  -BP Max 196/80 upon arrival to the ED.  And her slurred speech and confusion largely resolved when blood pressure is improved to 170-180s -Neurology is consulted and stroke work-up pending.  No new stroke was noted per head CT and CTA head and neck -Per neurology consult, while allowing for permissive hypertension, goal of SBP should be less than 180.    #GERD -chronic and stable   #Depression -chronic and stable   #Mild pseudohyperkalemia  Potassium was 4.8 upon arrival to the ED and repeated potassium in 30 minutes was 5.4, which is likely pseudohyperkalemia -Repeat BMP         DVT prophylaxis:  Code Status:  Family Communication:  Disposition Plan:   Consults called:  Admission status:    Time Spent: 65 minutes.  >50% of the time was devoted to discussing the patients care, assessment, plan and disposition with other care givers along with counseling the patient about the risks and benefits of treatment.    Dede Query MD [facility]  If 7PM-7AM, please contact night-coverage   09/30/2019, 10:41 PM

## 2019-09-30 NOTE — ED Notes (Signed)
Neuro team aware of BP, will allow for permissive HTN.

## 2019-09-30 NOTE — Consult Note (Addendum)
NEURO HOSPITALIST  CONSULT   Requesting Physician: Dr.    Laurel Dimmer Complaint:  AMS/ weakness  History obtained from:  EMS/ chart/ Family-daughter Ms. Alyssa Adams over the phone.  HPI:                                                                                                                                         Alyssa Adams is an 86 y.o. female  With PMH CVA ( 2006 residual right side deficits), HTN who presented to [facility] ED as a code stroke for AMS and left side weakness.  Patient was at home and completely normal this morning. Between 1100-1200 the sitter noticed that she was having trouble with the remote.  At 1300 some more left side weakness. When daughter came home she decided to call EMS. Use a walker at baseline to ambulate-which she does not do much though she is usually in bed or in her chair for most part of the day..  Unable to perform ADL's on her own. Not on any anticoagulation.  Her prior stroke was 2005.  Left her with right hemiparesis.  Her speech at baseline according to the daughter is normal but her short-term memory is very poor and has been very poor for many many years now. subacute periventricular left parietal infarct in 2006 with residual right side weakness.   1300 09/30/19 ED course:  BP: 200/90 BG: 94 WT: 69.5kg  CTH: no hemorrhage ASPECTS 9 CTA. No LVO  LKW: at best 1200hrs  09/30/2019 tPA Given:no; outside of window Modified Rankin: Rankin Score=4 NIHSS:4  Past Medical History:  Diagnosis Date  . Depression   . GERD (gastroesophageal reflux disease)   . Hypertension   . Stroke (cerebrum) [facility])     Past Surgical History:  Procedure Laterality Date  . ABDOMINAL HYSTERECTOMY    . GALLBLADDER SURGERY      Family History  Problem Relation Age of Onset  . Kidney cancer Father   . Prostate cancer Neg Hx    Social History:  reports that she has never smoked. She has never used smokeless tobacco. She  reports that she does not drink alcohol. No history on file for drug.  Allergies:  Allergies  Allergen Reactions  . Erythromycin Ethylsuccinate     Other reaction(s): Unknown  . Sulfa Antibiotics     Other reaction(s): Unknown Other reaction(s): Unknown    Medications:  Current Facility-Administered Medications  Medication Dose Route Frequency Provider Last Rate Last Admin  . sodium chloride flush (NS) 0.9 % injection 3 mL  3 mL Intravenous Once Gerhard Munch, MD       Current Outpatient Medications  Medication Sig Dispense Refill  . amLODipine (NORVASC) 5 MG tablet TAKE 1 TABLET EVERY DAY    . amLODipine-atorvastatin (CADUET) 5-10 MG tablet Take 1 tablet by mouth daily.    . benzonatate (TESSALON PERLES) 100 MG capsule Take 1 capsule (100 mg total) by mouth 3 (three) times daily as needed. 15 capsule 0  . cholestyramine light (PREVALITE) 4 g packet TAKE 1 PACKET BY MOUTH EVERY MORNING BEFORE BREAKFAST    . Cyanocobalamin (VITAMIN B-12 CR) 1000 MCG TBCR Take by mouth.    . dipyridamole-aspirin (AGGRENOX) 200-25 MG 12hr capsule TAKE ONE CAPSULE TWICE A DAY    . doxycycline (VIBRAMYCIN) 100 MG capsule Take 1 capsule (100 mg total) by mouth 2 (two) times daily. 20 capsule 0  . metoprolol succinate (TOPROL-XL) 50 MG 24 hr tablet TAKE 1 TABLET EVERY DAY    . mirabegron ER (MYRBETRIQ) 50 MG TB24 tablet Take 1 tablet (50 mg total) by mouth daily. 30 tablet 11  . pantoprazole (PROTONIX) 40 MG tablet TAKE 1 TABLET EVERY DAY    . sucralfate (CARAFATE) 1 g tablet Take 1 g by mouth 4 (four) times daily -  with meals and at bedtime.    Marland Kitchen venlafaxine XR (EFFEXOR-XR) 150 MG 24 hr capsule TAKE ONE CAPSULE DAILY    . Vitamin D, Ergocalciferol, (DRISDOL) 50000 units CAPS capsule TAKE ONE CAPSULE TWICE A WEEK      ROS:                                                                                                                                        Unable d/t AMS  General Examination:                                                                                                      Weight 69.5 kg.  Physical Exam  Constitutional: Appears well-developed and well-nourished.  Psych: Affect appropriate to situation Eyes: Normal external eye and conjunctiva. HENT: Normocephalic, no lesions, without obvious abnormality.   Musculoskeletal-no joint tenderness, deformity or swelling Cardiovascular: Normal rate and regular rhythm.  Respiratory: Effort normal, non-labored breathing saturations WNL GI: Soft.  No distension. There is no tenderness.  Skin: WDI  Neurological Examination Mental Status: Alert, able  to name simple objects like watch and pen but unable to name others such as a key. Some aphasia present  able to repeat sentences.  Able to follow simple commands without difficulty. On repeat exams, the ability to repeat also was diminished-most of the speech exam waxed and waned. Cranial Nerves: ZC:HYIFOY fields grossly normal,  III,IV, VI: ptosis not present, extra-ocular motions intact bilaterally, pupils equal, round, reactive to light and accommodation V,VII: smile asymmetric, facial droop facial light touch sensation normal bilaterally VIII: hearing normal bilaterally IX,X: uvula rises midline XI: bilateral shoulder shrug XII: midline tongue extension Motor: RUE contracted ( from previous CVA) 4-/5  RLE 4+/5  LUE and LLE 5/5 Tone and bulk:normal tone throughout; no atrophy noted Sensory: decreased to LT Cerebellar: Unable to perform on right. No ataxia noted on left. Gait: deferred   Lab Results: Basic Metabolic Panel: Recent Labs  Lab 09/30/19 1707  NA 140  K 5.4*  CL 105  GLUCOSE 108*  BUN 21  CREATININE 0.80    CBC: Recent Labs  Lab 09/30/19 1653 09/30/19 1707  WBC 7.6  --   NEUTROABS 4.5  --   HGB 13.6 13.6  HCT 41.8 40.0   MCV 97.4  --   PLT 253  --    CBG: Recent Labs  Lab 09/30/19 1651  GLUCAP 94    Imaging: CT HEAD CODE STROKE WO CONTRAST  Result Date: 09/30/2019 CLINICAL DATA:  Code stroke. Possible stroke, ataxia, stroke suspected. Additional history provided: Last known well 1300 EXAM: CT HEAD WITHOUT CONTRAST TECHNIQUE: Contiguous axial images were obtained from the base of the skull through the vertex without intravenous contrast. COMPARISON:  Brain MRI 07/19/2005, brain MRI 08/01/2004, head CT 07/31/2004 FINDINGS: Brain: There is no evidence of acute intracranial hemorrhage. Ischemic changes within the left lentiform nucleus, progressed as compared to MRI 07/19/2005, but otherwise age indeterminate. Redemonstrated chronic lacunar infarct within the left corona radiata. Background advanced chronic small vessel ischemic disease and moderate generalized parenchymal atrophy, also progressed. There is no evidence of intracranial mass. No midline shift or extra-axial fluid collection. Vascular: No hyperdense vessel.  Atherosclerotic calcifications. Skull: Normal. Negative for fracture or focal lesion. Sinuses/Orbits: Visualized orbits demonstrate no acute abnormality. No significant paranasal sinus disease or mastoid effusion at the imaged levels. ASPECTS (Alberta Stroke Program Early CT Score) - Ganglionic level infarction (caudate, lentiform nuclei, internal capsule, insula, M1-M3 cortex): 6 (point subtracted for age-indeterminate ischemic changes within the left lentiform nucleus) - Supraganglionic infarction (M4-M6 cortex): 3 Total score (0-10 with 10 being normal): 9 These results were called by telephone at the time of interpretation on 09/30/2019 at 5:14 pm to provider Dr. Wilford Corner, who verbally acknowledged these results. IMPRESSION: 1. Age-indeterminate ischemic changes within the left lentiform nucleus. Aspects 9. 2. No evidence of acute intracranial hemorrhage or acute demarcated cortical infarct. 3. Advanced  chronic small vessel ischemic disease has progressed as compared to prior MRI 07/19/2005. Redemonstrated chronic lacunar infarct within the left corona radiata. 4. Moderate generalized parenchymal atrophy. Electronically Signed   By: Jackey Loge DO   On: 09/30/2019 17:17   Valentina Lucks, MSN, NP-C [facility] [phone]  09/30/2019, 5:00 PM   Attending physician note to follow with Assessment and plan .  Attending addendum Patient seen and examined as acute code stroke. On examination, has baseline right upper extremity increased tone and weakness.  Mild weakness in the right leg.  Subtle aphasia-was able to name some simple objects but not others.  Was able  to repeat without deficit. CT head with progression of white matter disease, possible subacute left basal ganglia stroke which might be slightly above than the prior stroke of 2005 2006. CTA head and neck with heavily calcified left carotid bifurcation and also calcification on the right carotid bifurcation with no emergent LVO. Within 6 hours-CTP not performed. Not a candidate for IR due to poor baseline modified Rankin score. Outside the window for IV TPA  Of note, was also very hypertensive when she presented-hypertensive emergency could also be in the differentials although I think stroke is more likely.  Assessment: 86 y.o. female With PMH CVA (2006 residual right side deficits), HTN who presented to [facility] ED as a code stroke for AMS and left side weakness. CTH did not show a hemorrhage. The CTA was negative for an LVO. Patient mRs is 4 at baseline so she is not a candidate for IR. Not a TPA candidate d/t outside of window. Needs complete stroke work-up  Differentials: -?Acute ischemic stroke - non hemorrhagic, possibly embolic vs. Recrudescence of old symptoms although the daughter tells me that she did not have any residual aphasia after the prior stroke. -?Hypertensive emergency   Recommendations: --Admit to  hosptalist --Due to consideration in the differentials being acute ischemic stroke versus hypertensive emergency, I would recommend allowing for permissive hypertension only up until 180 and using as needed medications if systolic blood pressures more than 180. --MRI Brain without contrast --Echocardiogram --Prophylactic therapy-Antiplatelet med-continue home Aggrenox. --High intensity Statin if LDL > 70 --HgbA1c, fasting lipid panel --PT consult, OT consult, Speech consult --Telemetry monitoring --Frequent neuro checks --Stroke swallow screen  --Check UA, chest x-ray, B12 and TSH.  Plan was discussed in detail with the emergency room provider-Dr. Clarice Pole as well as patient's daughter Ms. Linda over the phone.  --Please page the Stroke team from 8am-4pm.   You can look them up on [email or web address]   -- Milon Dikes, MD [facility] Pager: [phone] If 7pm to 7am, please call on call as listed on AMION.

## 2019-10-01 ENCOUNTER — Observation Stay (HOSPITAL_COMMUNITY): Payer: PPO

## 2019-10-01 DIAGNOSIS — R29704 NIHSS score 4: Secondary | ICD-10-CM | POA: Diagnosis present

## 2019-10-01 DIAGNOSIS — F329 Major depressive disorder, single episode, unspecified: Secondary | ICD-10-CM | POA: Diagnosis present

## 2019-10-01 DIAGNOSIS — I6389 Other cerebral infarction: Secondary | ICD-10-CM

## 2019-10-01 DIAGNOSIS — Z8673 Personal history of transient ischemic attack (TIA), and cerebral infarction without residual deficits: Secondary | ICD-10-CM

## 2019-10-01 DIAGNOSIS — R1312 Dysphagia, oropharyngeal phase: Secondary | ICD-10-CM

## 2019-10-01 DIAGNOSIS — Z8051 Family history of malignant neoplasm of kidney: Secondary | ICD-10-CM | POA: Diagnosis not present

## 2019-10-01 DIAGNOSIS — I69351 Hemiplegia and hemiparesis following cerebral infarction affecting right dominant side: Secondary | ICD-10-CM | POA: Diagnosis not present

## 2019-10-01 DIAGNOSIS — I161 Hypertensive emergency: Secondary | ICD-10-CM | POA: Diagnosis present

## 2019-10-01 DIAGNOSIS — Z66 Do not resuscitate: Secondary | ICD-10-CM | POA: Diagnosis present

## 2019-10-01 DIAGNOSIS — R4781 Slurred speech: Secondary | ICD-10-CM | POA: Diagnosis present

## 2019-10-01 DIAGNOSIS — R4701 Aphasia: Secondary | ICD-10-CM | POA: Diagnosis present

## 2019-10-01 DIAGNOSIS — R4182 Altered mental status, unspecified: Secondary | ICD-10-CM | POA: Diagnosis present

## 2019-10-01 DIAGNOSIS — I10 Essential (primary) hypertension: Secondary | ICD-10-CM | POA: Diagnosis present

## 2019-10-01 DIAGNOSIS — Z20822 Contact with and (suspected) exposure to covid-19: Secondary | ICD-10-CM | POA: Diagnosis present

## 2019-10-01 DIAGNOSIS — E876 Hypokalemia: Secondary | ICD-10-CM | POA: Diagnosis present

## 2019-10-01 DIAGNOSIS — E785 Hyperlipidemia, unspecified: Secondary | ICD-10-CM | POA: Diagnosis present

## 2019-10-01 DIAGNOSIS — I63332 Cerebral infarction due to thrombosis of left posterior cerebral artery: Secondary | ICD-10-CM | POA: Diagnosis present

## 2019-10-01 DIAGNOSIS — I6501 Occlusion and stenosis of right vertebral artery: Secondary | ICD-10-CM | POA: Diagnosis present

## 2019-10-01 DIAGNOSIS — K219 Gastro-esophageal reflux disease without esophagitis: Secondary | ICD-10-CM | POA: Diagnosis present

## 2019-10-01 DIAGNOSIS — I6523 Occlusion and stenosis of bilateral carotid arteries: Secondary | ICD-10-CM | POA: Diagnosis present

## 2019-10-01 LAB — LIPID PANEL
Cholesterol: 200 mg/dL (ref 0–200)
HDL: 55 mg/dL (ref >40)
LDL Cholesterol: 111 mg/dL — ABNORMAL HIGH (ref 0–99)
Total CHOL/HDL Ratio: 3.6 RATIO
Triglycerides: 170 mg/dL — ABNORMAL HIGH (ref <150)
VLDL: 34 mg/dL (ref 0–40)

## 2019-10-01 LAB — CBG MONITORING, ED: Glucose-Capillary: 109 mg/dL — ABNORMAL HIGH (ref 70–99)

## 2019-10-01 LAB — CBC
HCT: 39.3 % (ref 36.0–46.0)
Hemoglobin: 12.9 g/dL (ref 12.0–15.0)
MCH: 31.9 pg (ref 26.0–34.0)
MCHC: 32.8 g/dL (ref 30.0–36.0)
MCV: 97 fL (ref 80.0–100.0)
Platelets: 208 10*3/uL (ref 150–400)
RBC: 4.05 MIL/uL (ref 3.87–5.11)
RDW: 13.2 % (ref 11.5–15.5)
WBC: 9.1 10*3/uL (ref 4.0–10.5)
nRBC: 0 % (ref 0.0–0.2)

## 2019-10-01 LAB — HEMOGLOBIN A1C
Hgb A1c MFr Bld: 5.6 % (ref 4.8–5.6)
Mean Plasma Glucose: 114.02 mg/dL

## 2019-10-01 LAB — COMPREHENSIVE METABOLIC PANEL
ALT: 20 U/L (ref 0–44)
AST: 19 U/L (ref 15–41)
Albumin: 3.6 g/dL (ref 3.5–5.0)
Alkaline Phosphatase: 78 U/L (ref 38–126)
Anion gap: 13 (ref 5–15)
BUN: 11 mg/dL (ref 8–23)
CO2: 23 mmol/L (ref 22–32)
Calcium: 9.1 mg/dL (ref 8.9–10.3)
Chloride: 106 mmol/L (ref 98–111)
Creatinine, Ser: 0.89 mg/dL (ref 0.44–1.00)
GFR calc Af Amer: >60 mL/min (ref >60)
GFR calc non Af Amer: 60 mL/min — ABNORMAL LOW (ref >60)
Glucose, Bld: 116 mg/dL — ABNORMAL HIGH (ref 70–99)
Potassium: 3.3 mmol/L — ABNORMAL LOW (ref 3.5–5.1)
Sodium: 142 mmol/L (ref 135–145)
Total Bilirubin: 0.9 mg/dL (ref 0.3–1.2)
Total Protein: 6.9 g/dL (ref 6.5–8.1)

## 2019-10-01 LAB — URINALYSIS, ROUTINE W REFLEX MICROSCOPIC
Bilirubin Urine: NEGATIVE
Glucose, UA: NEGATIVE mg/dL
Ketones, ur: NEGATIVE mg/dL
Leukocytes,Ua: NEGATIVE
Nitrite: POSITIVE — AB
Protein, ur: NEGATIVE mg/dL
Specific Gravity, Urine: 1.025 (ref 1.005–1.030)
pH: 6 (ref 5.0–8.0)

## 2019-10-01 LAB — VITAMIN B12: Vitamin B-12: 131 pg/mL — ABNORMAL LOW (ref 180–914)

## 2019-10-01 LAB — URINALYSIS, MICROSCOPIC (REFLEX)

## 2019-10-01 MED ORDER — ACETAMINOPHEN 160 MG/5ML PO SOLN: 650.0000 mg | ORAL | Status: DC | PRN

## 2019-10-01 MED ORDER — SODIUM CHLORIDE 0.9 % IV SOLN: INTRAVENOUS | Status: DC

## 2019-10-01 MED ORDER — ASPIRIN EC 81 MG PO TBEC
81.0000 mg | DELAYED_RELEASE_TABLET | Freq: Every day | ORAL | Status: DC
  Administered 2019-10-01 – 2019-10-06 (×6): 81 mg via ORAL
  Filled 2019-10-01 (×6): qty 1

## 2019-10-01 MED ORDER — MIRABEGRON ER 25 MG PO TB24
50.0000 mg | ORAL_TABLET | Freq: Every day | ORAL | Status: DC
  Administered 2019-10-01 – 2019-10-06 (×5): 50 mg via ORAL
  Filled 2019-10-01 (×5): qty 2
  Filled 2019-10-01: qty 1
  Filled 2019-10-01: qty 2

## 2019-10-01 MED ORDER — ONDANSETRON HCL 4 MG/2ML IJ SOLN: 4.0000 mg | Freq: Four times a day (QID) | INTRAMUSCULAR | Status: DC | PRN

## 2019-10-01 MED ORDER — STROKE: EARLY STAGES OF RECOVERY BOOK
Freq: Once | Status: AC
  Filled 2019-10-01: qty 1

## 2019-10-01 MED ORDER — SODIUM CHLORIDE 0.9% FLUSH
10.0000 mL | Freq: Once | INTRAVENOUS | Status: AC
  Administered 2019-10-01: 10 mL via INTRAVENOUS

## 2019-10-01 MED ORDER — ACETAMINOPHEN 650 MG RE SUPP: 650.0000 mg | RECTAL | Status: DC | PRN

## 2019-10-01 MED ORDER — CLOPIDOGREL BISULFATE 75 MG PO TABS
75.0000 mg | ORAL_TABLET | Freq: Every day | ORAL | Status: DC
  Administered 2019-10-01 – 2019-10-06 (×6): 75 mg via ORAL
  Filled 2019-10-01 (×6): qty 1

## 2019-10-01 MED ORDER — SENNOSIDES-DOCUSATE SODIUM 8.6-50 MG PO TABS: 1.0000 | ORAL_TABLET | Freq: Every evening | ORAL | Status: DC | PRN

## 2019-10-01 MED ORDER — ATORVASTATIN CALCIUM 40 MG PO TABS
40.0000 mg | ORAL_TABLET | Freq: Every day | ORAL | Status: DC
  Administered 2019-10-01 – 2019-10-06 (×7): 40 mg via ORAL
  Filled 2019-10-01 (×7): qty 1

## 2019-10-01 MED ORDER — HEPARIN SODIUM (PORCINE) 5000 UNIT/ML IJ SOLN
5000.0000 [IU] | Freq: Three times a day (TID) | INTRAMUSCULAR | Status: DC
  Administered 2019-10-02 – 2019-10-06 (×14): 5000 [IU] via SUBCUTANEOUS
  Filled 2019-10-01 (×13): qty 1

## 2019-10-01 MED ORDER — ACETAMINOPHEN 325 MG PO TABS: 650.0000 mg | ORAL_TABLET | ORAL | Status: DC | PRN

## 2019-10-01 NOTE — ED Notes (Signed)
PT/OT at bedside.

## 2019-10-01 NOTE — Evaluation (Signed)
Physical Therapy Evaluation Patient Details Name: Alyssa Adams MRN: [id number] DOB: 24-Oct-1933 Today's Date: 10/01/2019   History of Present Illness  Pt is 86 y.o. female with medical history significant of stroke with residual right-sided weakness, HTN, GERD, depression who presented with slurred speech and altered mental status.  Pt admitted with HTN and CVA.  MRI revealed small abnormal diffusion restriction along the posteromedial L temporal lobe consistent with small acute infarct.  Clinical Impression  Pt admitted with above diagnosis. Pt did not MMT for new weaknesses; however, mobility and transfer ability were significantly limited compared to baseline.  She required mod A of 2 for safety with transfers and was not able to take more than a couple of steps.  Pt was limited due to N/V.  All VSS throughout.  Pt with good support system and DME at home.   Pt currently with functional limitations due to the deficits listed below (see PT Problem List). Pt will benefit from skilled PT to increase their independence and safety with mobility to allow discharge to the venue listed below.       Follow Up Recommendations CIR    Equipment Recommendations  None recommended by PT;Other (comment)(has DME)    Recommendations for Other Services Rehab consult     Precautions / Restrictions Precautions Precautions: Fall      Mobility  Bed Mobility Overal bed mobility: Needs Assistance Bed Mobility: Supine to Sit;Sit to Supine     Supine to sit: Mod assist Sit to supine: Mod assist;+2 for physical assistance   General bed mobility comments: For supine/sit required mod A to lift trunk and scoot to EOB;  for sit supine required mod A x 2 for trunk and legs (some limitations due to high stretcher and 2 people to lay down to prevent sliding)  Transfers Overall transfer level: Needs assistance Equipment used: Rolling walker (2 wheeled);1 person hand held assist Transfers: Sit to/from  [employer] Sit to Stand: Min assist;From elevated surface Stand pivot transfers: Mod assist;+2 physical assistance       General transfer comment: Mod A to pivot to left holding onto RW (PT assisted with RW and mod A for balance).  For pivot back to bed to the right required mod A x 2.  Pt fearful of falling, required increased cues for technique/safety, and with shaking/trembling in LEs  Ambulation/Gait Ambulation/Gait assistance: Mod assist;+2 physical assistance Gait Distance (Feet): 2 Feet Assistive device: 1 person hand held assist Gait Pattern/deviations: Step-to pattern;Shuffle;Decreased stride length Gait velocity: decreased   General Gait Details: steps back to bed during stand pivot (see above)  Stairs            Wheelchair Mobility    Modified Rankin (Stroke Patients Only) Modified Rankin (Stroke Patients Only) Pre-Morbid Rankin Score: Moderate disability Modified Rankin: Severe disability     Balance Overall balance assessment: Needs assistance Sitting-balance support: Feet supported;No upper extremity supported Sitting balance-Leahy Scale: Fair     Standing balance support: Single extremity supported;During functional activity Standing balance-Leahy Scale: Poor                               Pertinent Vitals/Pain Pain Assessment: No/denies pain    Home Living Family/patient expects to be discharged to:: Private residence Living Arrangements: Alone Available Help at Discharge: Available PRN/intermittently;Family;Personal care attendant(daughter stays at night and personal aide during day for 3 hr) Type of Home: House Home Access: Stairs to  enter Entrance Stairs-Rails: None Entrance Stairs-Number of Steps: 1 Home Layout: Multi-level;Able to live on main level with bedroom/bathroom Home Equipment: Gilmer Mor - single point;Walker - 4 wheels;Bedside commode;Wheelchair - manual;Grab bars - toilet;Grab bars - tub/shower       Prior Function Level of Independence: Needs assistance   Gait / Transfers Assistance Needed: Uses w/c in community and RW in house (reports pushes rollator with 1 hand  ADL's / Homemaking Assistance Needed: Reports just does sponge baths because fearful of getting in shower; independent with ADLs; has assistance with cleaning and cooking; can fix easy meals on her own        Hand Dominance        Extremity/Trunk Assessment   Upper Extremity Assessment Upper Extremity Assessment: Defer to OT evaluation;RUE deficits/detail RUE Deficits / Details: R UE contractures baseline    Lower Extremity Assessment Lower Extremity Assessment: LLE deficits/detail;RLE deficits/detail RLE Deficits / Details: ROM WFL except R ankle with PF contracture;  MMT ankle 1/5, knee 4/5, hip 4/5 RLE Sensation: WNL LLE Deficits / Details: ROM WFL; MMT 5/5 LLE Sensation: WNL    Cervical / Trunk Assessment Cervical / Trunk Assessment: Normal  Communication   Communication: No difficulties  Cognition Arousal/Alertness: Awake/alert Behavior During Therapy: WFL for tasks assessed/performed Overall Cognitive Status: No family/caregiver present to determine baseline cognitive functioning Area of Impairment: Orientation                 Orientation Level: Disoriented to;Time(states year 2020 but knew it was April)                    General Comments General comments (skin integrity, edema, etc.): Pt with N/V once OOB.  Also, incontinent of urine.  VSS.    Exercises     Assessment/Plan    PT Assessment Patient needs continued PT services  PT Problem List Decreased strength;Decreased mobility;Decreased safety awareness;Decreased range of motion;Decreased coordination;Decreased activity tolerance;Decreased balance;Decreased knowledge of use of DME       PT Treatment Interventions DME instruction;Therapeutic exercise;Gait training;Balance training;Wheelchair mobility training;Stair  training;Neuromuscular re-education;Functional mobility training;Therapeutic activities;Patient/family education    PT Goals (Current goals can be found in the Care Plan section)  Acute Rehab PT Goals Patient Stated Goal: return home PT Goal Formulation: With patient Time For Goal Achievement: 10/15/19 Potential to Achieve Goals: Good    Frequency Min 4X/week   Barriers to discharge Decreased caregiver support      Co-evaluation               AM-PAC PT "6 Clicks" Mobility  Outcome Measure Help needed turning from your back to your side while in a flat bed without using bedrails?: A Lot Help needed moving from lying on your back to sitting on the side of a flat bed without using bedrails?: A Lot Help needed moving to and from a bed to a chair (including a wheelchair)?: A Lot Help needed standing up from a chair using your arms (e.g., wheelchair or bedside chair)?: A Lot Help needed to walk in hospital room?: Total Help needed climbing 3-5 steps with a railing? : Total 6 Click Score: 10    End of Session Equipment Utilized During Treatment: Gait belt Activity Tolerance: Patient limited by fatigue;Other (comment)(limited due to vomiting) Patient left: in bed;with call bell/phone within reach Nurse Communication: Mobility status(need for 2 to assist; pt with vomiting) PT Visit Diagnosis: Unsteadiness on feet (R26.81);Other abnormalities of gait and mobility (R26.89);Muscle weakness (generalized) (M62.81)  Time: 8616-8372 PT Time Calculation (min) (ACUTE ONLY): 47 min   Charges:   PT Evaluation $PT Eval Moderate Complexity: 1 Mod PT Treatments $Therapeutic Activity: 8-22 mins        Maggie Font, PT Acute Rehab Services Pager [phone] [facility] Rehab [phone] [facility] [phone]   Karlton Lemon 10/01/2019, 12:53 PM

## 2019-10-01 NOTE — Progress Notes (Signed)
  Echocardiogram 2D Echocardiogram has been performed.  Alyssa Adams 10/01/2019, 2:00 PM

## 2019-10-01 NOTE — ED Notes (Signed)
[name], daughter  would like an update [phone]

## 2019-10-01 NOTE — Progress Notes (Signed)
STROKE TEAM PROGRESS NOTE   INTERVAL HISTORY No family at bedside. Pt awake alert and stated that she was confused and memory issue yesterday at home. Now she feels fine.  Vitals:   10/01/19 0845 10/01/19 0900 10/01/19 0945 10/01/19 1000  BP: (!) 172/73 (!) 178/86 (!) 163/66 (!) 169/72  Pulse: 67 65 64 63  Resp: 15 11 14 11   Temp:      TempSrc:      SpO2: 94% 96% 95% 97%  Weight:        CBC:  Recent Labs  Lab 09/30/19 1653 09/30/19 1653 09/30/19 1707 10/01/19 0347  WBC 7.6  --   --  9.1  NEUTROABS 4.5  --   --   --   HGB 13.6   < > 13.6 12.9  HCT 41.8   < > 40.0 39.3  MCV 97.4  --   --  97.0  PLT 253  --   --  208   < > = values in this interval not displayed.    Basic Metabolic Panel:  Recent Labs  Lab 09/30/19 1653 09/30/19 1653 09/30/19 1707 10/01/19 0347  NA 141   < > 140 142  K 4.8   < > 5.4* 3.3*  CL 108   < > 105 106  CO2 24  --   --  23  GLUCOSE 113*   < > 108* 116*  BUN 14   < > 21 11  CREATININE 0.84   < > 0.80 0.89  CALCIUM 9.0  --   --  9.1   < > = values in this interval not displayed.   Lipid Panel:     Component Value Date/Time   CHOL 200 10/01/2019 0347   TRIG 170 (H) 10/01/2019 0347   HDL 55 10/01/2019 0347   CHOLHDL 3.6 10/01/2019 0347   VLDL 34 10/01/2019 0347   LDLCALC 111 (H) 10/01/2019 0347   HgbA1c:  Lab Results  Component Value Date   HGBA1C 5.6 10/01/2019   Urine Drug Screen: No results found for: LABOPIA, COCAINSCRNUR, LABBENZ, AMPHETMU, THCU, LABBARB  Alcohol Level No results found for: [facility]  IMAGING past 24 hours CT Code Stroke CTA Head W/WO contrast  Result Date: 09/30/2019 CLINICAL DATA:  Altered mental status.  Weakness. EXAM: CT ANGIOGRAPHY HEAD AND NECK TECHNIQUE: Multidetector CT imaging of the head and neck was performed using the standard protocol during bolus administration of intravenous contrast. Multiplanar CT image reconstructions and MIPs were obtained to evaluate the vascular anatomy. Carotid stenosis  measurements (when applicable) are obtained utilizing NASCET criteria, using the distal internal carotid diameter as the denominator. CONTRAST:  37mL OMNIPAQUE IOHEXOL 350 MG/ML SOLN COMPARISON:  None. FINDINGS: CTA NECK FINDINGS Aortic arch: Standard 3 vessel aortic arch with minimal atherosclerotic plaque. Widely patent arch vessel origins. Right carotid system: Patent with a moderate amount of predominantly calcified plaque at the carotid bifurcation. No evidence of significant stenosis or dissection. Tortuous proximal common carotid artery. Left carotid system: Patent with extensive calcified plaque at the carotid bifurcation resulting in less than 50% narrowing of the ICA origin. Tortuous proximal common carotid artery. Vertebral arteries: Patent and codominant without evidence of stenosis or dissection. Skeleton: Moderate diffuse cervical disc degeneration. Other neck: No evidence of cervical lymphadenopathy or mass. Upper chest: Clear lung apices. Review of the MIP images confirms the above findings CTA HEAD FINDINGS Anterior circulation: The internal carotid arteries are patent from skull base to carotid termini. Calcified plaque in the cavernous  and proximal supraclinoid segments bilaterally does not result in significant stenosis. There is a punctate calcification at the left ICA terminus, favored to be chronic as it was likely faintly visible on a 2006 head CT. This likely results in moderate left MCA origin stenosis and potentially severe left ACA origin stenosis. The left A1 segment is hypoplastic, and the left ACA is primarily supplied from the right via the anterior communicating artery. The right A1 segment is widely patent. Both MCAs are patent without evidence of proximal branch occlusion. There are mild left M1 and proximal right M2 stenoses. No aneurysm is identified. Posterior circulation: The intracranial vertebral arteries are patent to the basilar with calcified plaque resulting in mild  proximal right V4 stenosis. Patent PICAs and SCAs are seen bilaterally. The basilar artery is patent and small in caliber congenitally. There are large posterior communicating arteries with hypoplastic right and absent left posterior communicating arteries. The PCAs are patent with branch vessel irregularity but no significant proximal stenosis. No aneurysm is identified. Venous sinuses: Poorly evaluated due to contrast timing. Anatomic variants: Predominantly fetal origin of the PCAs. Review of the MIP images confirms the above findings IMPRESSION: 1. No large vessel occlusion. 2. Suspected chronic calcification at the left ICA terminus resulting in ACA and MCA origin stenoses. 3. Mild distal right vertebral artery stenosis. 4. Left greater than right cervical carotid artery atherosclerosis without significant stenosis. These results were communicated to Dr. Wilford Corner at 5:20 pm on 09/30/2019 by text page via the [facility] messaging system. Electronically Signed   By: Sebastian Ache M.D.   On: 09/30/2019 17:34   DG Chest 1 View  Result Date: 09/30/2019 CLINICAL DATA:  Slurred speech EXAM: CHEST  1 VIEW COMPARISON:  06/07/2012 FINDINGS: The heart size and mediastinal contours are within normal limits. Both lungs are clear. The visualized skeletal structures are unremarkable. IMPRESSION: No active disease. Electronically Signed   By: Jasmine Pang M.D.   On: 09/30/2019 22:51   CT Code Stroke CTA Neck W/WO contrast  Result Date: 09/30/2019 CLINICAL DATA:  Altered mental status.  Weakness. EXAM: CT ANGIOGRAPHY HEAD AND NECK TECHNIQUE: Multidetector CT imaging of the head and neck was performed using the standard protocol during bolus administration of intravenous contrast. Multiplanar CT image reconstructions and MIPs were obtained to evaluate the vascular anatomy. Carotid stenosis measurements (when applicable) are obtained utilizing NASCET criteria, using the distal internal carotid diameter as the denominator.  CONTRAST:  13mL OMNIPAQUE IOHEXOL 350 MG/ML SOLN COMPARISON:  None. FINDINGS: CTA NECK FINDINGS Aortic arch: Standard 3 vessel aortic arch with minimal atherosclerotic plaque. Widely patent arch vessel origins. Right carotid system: Patent with a moderate amount of predominantly calcified plaque at the carotid bifurcation. No evidence of significant stenosis or dissection. Tortuous proximal common carotid artery. Left carotid system: Patent with extensive calcified plaque at the carotid bifurcation resulting in less than 50% narrowing of the ICA origin. Tortuous proximal common carotid artery. Vertebral arteries: Patent and codominant without evidence of stenosis or dissection. Skeleton: Moderate diffuse cervical disc degeneration. Other neck: No evidence of cervical lymphadenopathy or mass. Upper chest: Clear lung apices. Review of the MIP images confirms the above findings CTA HEAD FINDINGS Anterior circulation: The internal carotid arteries are patent from skull base to carotid termini. Calcified plaque in the cavernous and proximal supraclinoid segments bilaterally does not result in significant stenosis. There is a punctate calcification at the left ICA terminus, favored to be chronic as it was likely faintly visible on a 2006  head CT. This likely results in moderate left MCA origin stenosis and potentially severe left ACA origin stenosis. The left A1 segment is hypoplastic, and the left ACA is primarily supplied from the right via the anterior communicating artery. The right A1 segment is widely patent. Both MCAs are patent without evidence of proximal branch occlusion. There are mild left M1 and proximal right M2 stenoses. No aneurysm is identified. Posterior circulation: The intracranial vertebral arteries are patent to the basilar with calcified plaque resulting in mild proximal right V4 stenosis. Patent PICAs and SCAs are seen bilaterally. The basilar artery is patent and small in caliber congenitally.  There are large posterior communicating arteries with hypoplastic right and absent left posterior communicating arteries. The PCAs are patent with branch vessel irregularity but no significant proximal stenosis. No aneurysm is identified. Venous sinuses: Poorly evaluated due to contrast timing. Anatomic variants: Predominantly fetal origin of the PCAs. Review of the MIP images confirms the above findings IMPRESSION: 1. No large vessel occlusion. 2. Suspected chronic calcification at the left ICA terminus resulting in ACA and MCA origin stenoses. 3. Mild distal right vertebral artery stenosis. 4. Left greater than right cervical carotid artery atherosclerosis without significant stenosis. These results were communicated to Dr. Wilford CornerArora at 5:20 pm on 09/30/2019 by text page [facility] the [facility]. Electronically Signed   By: Sebastian AcheAllen  Grady M.D.   On: 09/30/2019 17:34   MR BRAIN WO CONTRAST  Result Date: 09/30/2019 CLINICAL DATA:  Possible stroke. EXAM: MRI HEAD WITHOUT CONTRAST TECHNIQUE: Multiplanar, multiecho pulse sequences of the brain and surrounding structures were obtained without intravenous contrast. COMPARISON:  Brain MRI 07/19/2005 FINDINGS: Brain: Small focus of abnormal diffusion restriction along the posteromedial left temporal lobe. No other diffusion abnormality. Diffuse confluent hyperintense T2-weighted signal within the periventricular, deep and juxtacortical white matter, most commonly due to chronic ischemic microangiopathy. There is generalized atrophy without lobar predilection. There are a few chronic microhemorrhages in a predominantly central distribution. Normal midline structures. Vascular: Normal flow voids. Skull and upper cervical spine: Normal marrow signal. Sinuses/Orbits: Negative. Other: None. IMPRESSION: 1. Small focus of abnormal diffusion restriction along the posteromedial left temporal lobe, consistent with a small acute infarct. 2. Severe chronic ischemic microangiopathy  and generalized atrophy. Chronic micro 3. Chronic microhemorrhage within a predominantly central distribution, likely indicating chronic hypertensive angiopathy. Electronically Signed   By: Deatra RobinsonKevin  Herman M.D.   On: 09/30/2019 23:42   CT HEAD CODE STROKE WO CONTRAST  Result Date: 09/30/2019 CLINICAL DATA:  Code stroke. Possible stroke, ataxia, stroke suspected. Additional history provided: Last known well 1300 EXAM: CT HEAD WITHOUT CONTRAST TECHNIQUE: Contiguous axial images were obtained from the base of the skull through the vertex without intravenous contrast. COMPARISON:  Brain MRI 07/19/2005, brain MRI 08/01/2004, head CT 07/31/2004 FINDINGS: Brain: There is no evidence of acute intracranial hemorrhage. Ischemic changes within the left lentiform nucleus, progressed as compared to MRI 07/19/2005, but otherwise age indeterminate. Redemonstrated chronic lacunar infarct within the left corona radiata. Background advanced chronic small vessel ischemic disease and moderate generalized parenchymal atrophy, also progressed. There is no evidence of intracranial mass. No midline shift or extra-axial fluid collection. Vascular: No hyperdense vessel.  Atherosclerotic calcifications. Skull: Normal. Negative for fracture or focal lesion. Sinuses/Orbits: Visualized orbits demonstrate no acute abnormality. No significant paranasal sinus disease or mastoid effusion at the imaged levels. ASPECTS [facility]([place] Stroke Program Early CT Score) - Ganglionic level infarction (caudate, lentiform nuclei, internal capsule, insula, M1-M3 cortex): 6 (point subtracted for age-indeterminate  ischemic changes within the left lentiform nucleus) - Supraganglionic infarction (M4-M6 cortex): 3 Total score (0-10 with 10 being normal): 9 These results were called by telephone at the time of interpretation on 09/30/2019 at 5:14 pm to provider Dr. Wilford Corner, who verbally acknowledged these results. IMPRESSION: 1. Age-indeterminate ischemic changes within the  left lentiform nucleus. Aspects 9. 2. No evidence of acute intracranial hemorrhage or acute demarcated cortical infarct. 3. Advanced chronic small vessel ischemic disease has progressed as compared to prior MRI 07/19/2005. Redemonstrated chronic lacunar infarct within the left corona radiata. 4. Moderate generalized parenchymal atrophy. Electronically Signed   By: Jackey Loge DO   On: 09/30/2019 17:17    PHYSICAL EXAM  Temp:  [98.5 F (36.9 C)] 98.5 F (36.9 C) (04/20 2228) Pulse Rate:  [60-83] 63 (04/21 1000) Resp:  [11-25] 11 (04/21 1000) BP: (107-196)/(50-155) 169/72 (04/21 1000) SpO2:  [92 %-100 %] 97 % (04/21 1000) Weight:  [69.5 kg] 69.5 kg (04/20 1720)  General - Well nourished, well developed, in no apparent distress.  Ophthalmologic - fundi not visualized due to noncooperation.  Cardiovascular - Regular rhythm and rate.  Mental Status -  Level of arousal and orientation to time, place, and person were intact. Language including expression, naming, repetition, comprehension was assessed and found intact. No significant dysarthria  Cranial Nerves II - XII - II - Visual field intact OU. III, IV, VI - Extraocular movements intact. V - Facial sensation intact bilaterally. VII - right facial droop, mild. VIII - Hearing & vestibular intact bilaterally. X - Palate elevates symmetrically. XI - Chin turning & shoulder shrug intact bilaterally. XII - Tongue protrusion intact.  Motor Strength - The patient's strength was normal in LUE and LLE, however, chronic right spastic hemiparesis. RUE 4/5 bicep and 2/5 tricep, significant spasticity of bicep and intrinsic finger muscles. RLE 4/5 proximal and distal but significant spasticity at ankle and moderate spasticity of knee extension.  Bulk was normal and fasciculations were absent.   Motor Tone - Muscle tone was assessed at the neck and appendages and was normal.  Reflexes - The patient's reflexes were increased right UE and LE 3+,  and she had positive right babinski pathological reflexes.  Sensory - Light touch, temperature/pinprick were assessed and were symmetrical.    Coordination - The patient had normal movements in the left hand no ataxia or dysmetria.  Tremor was absent.  Gait and Station - deferred.   ASSESSMENT/PLAN Ms. Alyssa Adams is a 86 y.o. female with history of stroke w/ R sided deficits, HTN presenting with AMS and L sided weakness.   Stroke: Small L PCA infarct likely secondary to small vessel disease source  Code Stroke CT head age indeterminate L lentiform nucleus infarct. Advanced Small vessel disease. Atrophy. Old L corona radiata lacune. ASPECTS 9    CTA head & neck no LVO. L ICA terminus calcification w/ ACA and MCA origin stenoses. Mild distal R VA stenosis. L>R ICA atherosclerosis.   MRI  Posteromedial L temporal lobe infarct. Severe small vessel disease. Atrophy. Chronic microhemorrhage  2D Echo EF 70-75%  LDL 111  HgbA1c 5.6  SCDs for VTE prophylaxis  No antithrombotids prior to admission, now on aspirin 81 mg daily and clopidogrel 75 mg daily. Continue DAPT x 3 weeks then aspirin alone   Therapy recommendations:  pending  (walker at baseline)  Disposition:  pending   Hx stroke/TIA  07/2004 - Speech changes and R sided weakness, found to have subacute periventricular L parietal  infarct.  Started on aggrenox. Sequela of R hemiparesis and progressive memory decline.   Hypertensive Emergency Hypertension  BP as high as 196/79 on arrival   Stable . Permissive hypertension (OK if < 220/120) but gradually normalize in 2-3 days . Long-term BP goal normotensive  Hyperlipidemia  Home meds:  cholestyramine  Now on lipitor 40  LDL 111, goal < 70  Continue statin at discharge  Dysphagia   Did not pass swallow screen  NPO  Speech to follow  On IVF  Other Stroke Risk Factors  Advanced age  Overweight, Body mass index is 28.02 kg/m., recommend weight  loss, diet and exercise as appropriate   Other Active Problems  Baseline poor short-term memory  GERD  Depression   Mild pseudohyperkalemia   Hypokalemia 4.8-5.4-3.3  Hospital day # 0  Neurology will sign off. Please call with questions. Pt will follow up with stroke clinic NP at [facility] in about 4 weeks. Thanks for the consult.  Marvel Plan, MD PhD Stroke Neurology 10/01/2019 5:55 PM  To contact Stroke Continuity provider, please refer to [email or web address]. After hours, contact General Neurology

## 2019-10-01 NOTE — Progress Notes (Signed)
Inpatient Rehab Admissions Coordinator Note:   Per PT recommendations, pt was screened for CIR candidacy by Estill Dooms, PT, DPT.  At this time note OT recommending SNF.  I will place a CIR consult order so we can further evaluate for candidacy.  Please contact me with questions.   Estill Dooms, PT, DPT [phone] 10/01/19 5:16 PM

## 2019-10-01 NOTE — Evaluation (Signed)
Occupational Therapy Evaluation Patient Details Name: Alyssa Adams MRN: [id number] DOB: 03/14/1934 Today's Date: 10/01/2019    History of Present Illness Pt is 86 y.o. female with medical history significant of stroke with residual right-sided weakness, HTN, GERD, depression who presented with slurred speech and altered mental status.  Pt admitted with HTN and CVA.  MRI revealed small abnormal diffusion restriction along the posteromedial L temporal lobe consistent with small acute infarct.   Clinical Impression   PT admitted with L temoral lobe infarct with R side weakness. Pt currently with functional limitiations due to the deficits listed below (see OT problem list). Pt required total +2 mod (A) for bed mobility and stand pivot transfer. Pt normally able to transfer supervision level. Pt high fall risk and lack of awareness to deficits. Pt will benefit from skilled OT to increase their independence and safety with adls and balance to allow discharge SNF. Pt likely to decline SNF based on facial expression and verbalized I like to be home. Pt educated that based on this session fall risk is high and must choose the highest level of care to help get her back to her home. Pt nodding in understanding.      Follow Up Recommendations  SNF;Supervision/Assistance - 24 hour    Equipment Recommendations  Wheelchair (measurements OT);Wheelchair cushion (measurements OT)    Recommendations for Other Services       Precautions / Restrictions Precautions Precautions: Fall      Mobility Bed Mobility Overal bed mobility: Needs Assistance Bed Mobility: Sit to Supine     Supine to sit: Mod assist Sit to supine: Mod assist;+2 for physical assistance   General bed mobility comments: sit<> supine required mod A x 2 for trunk and legs (some limitations due to high stretcher and 2 people to lay down to prevent sliding)  Transfers Overall transfer level: Needs assistance Equipment used:  Rolling walker (2 wheeled);1 person hand held assist Transfers: Sit to/from [employer] Sit to Stand: Min assist;From elevated surface Stand pivot transfers: Mod assist;+2 physical assistance       General transfer comment:  For pivot back to bed to the right required mod A x 2.  Pt fearful of falling, required increased cues for technique/safety, and with shaking/trembling in LEs    Balance Overall balance assessment: Needs assistance Sitting-balance support: Feet supported;No upper extremity supported Sitting balance-Leahy Scale: Fair     Standing balance support: Single extremity supported;During functional activity Standing balance-Leahy Scale: Poor                             ADL either performed or assessed with clinical judgement   ADL Overall ADL's : Needs assistance/impaired Eating/Feeding: Minimal assistance;Bed level   Grooming: Wash/dry face;Minimal assistance;Sitting Grooming Details (indicate cue type and reason): wiping mouth after vomitting         Upper Body Dressing : Maximal assistance   Lower Body Dressing: Maximal assistance   Toilet Transfer: +2 for physical assistance;Moderate assistance           Functional mobility during ADLs: +2 for physical assistance;Maximal assistance General ADL Comments: pt fearful of falling and holding onto RW. pt reports using Rollator with one hand at baseline. pt with poor demo of use of RW this session. question progression with use of DME and needs to increase to platform for R UE positioning     Vision   Additional Comments: to be further assessed.  limited due to vomitting on arrival     Perception     Praxis      Pertinent Vitals/Pain Pain Assessment: No/denies pain     Hand Dominance Right   Extremity/Trunk Assessment Upper Extremity Assessment Upper Extremity Assessment: RUE deficits/detail RUE Deficits / Details: R UE contractures baseline, pt MPC contracture at 90  degrees, 1st digit is adducted and cortical thumb position with contracture. pt reports daughter made her a splint out of toilet paper holders. pt noted to have palm redness from finger nails and high risk for skin break down. Recommending palm guard RUE Sensation: decreased light touch;decreased proprioception RUE Coordination: decreased fine motor;decreased gross motor   Lower Extremity Assessment Lower Extremity Assessment: Defer to PT evaluation RLE Deficits / Details: ROM WFL except R ankle with PF contracture;  MMT ankle 1/5, knee 4/5, hip 4/5 RLE Sensation: WNL LLE Deficits / Details: ROM WFL; MMT 5/5 LLE Sensation: WNL   Cervical / Trunk Assessment Cervical / Trunk Assessment: Normal   Communication Communication Communication: No difficulties   Cognition Arousal/Alertness: Awake/alert Behavior During Therapy: WFL for tasks assessed/performed Overall Cognitive Status: No family/caregiver present to determine baseline cognitive functioning Area of Impairment: Orientation                 Orientation Level: Disoriented to;Time(states year 2020 but knew it was April)             General Comments: decrease awareness of deficits    General Comments  incontinence with transfers and unaware, vomitting with movement     Exercises Exercises: Other exercises Other Exercises Other Exercises: R UE AAROM forward flexion 80 degrees, abduction shoulder > 45 degrees, full extension elbow, noted hand contractures   Shoulder Instructions      Home Living Family/patient expects to be discharged to:: Private residence Living Arrangements: Alone Available Help at Discharge: Available PRN/intermittently;Family;Personal care attendant(daughter stays at night and personal aide during day for 3 hr) Type of Home: House Home Access: Stairs to enter [employer] of Steps: 1 Entrance Stairs-Rails: None Home Layout: Multi-level;Able to live on main level with  bedroom/bathroom     Bathroom Shower/Tub: Teacher, early years/pre: Standard     Home Equipment: Cane - single point;Walker - 4 wheels;Bedside commode;Wheelchair - manual;Grab bars - toilet;Grab bars - tub/shower          Prior Functioning/Environment Level of Independence: Needs assistance  Gait / Transfers Assistance Needed: Uses w/c in community and RW in house (reports pushes rollator with 1 hand ADL's / Homemaking Assistance Needed: Reports just does sponge baths because fearful of getting in shower; independent with ADLs; has assistance with cleaning and cooking; can fix easy meals on her own            OT Problem List: Decreased strength;Decreased range of motion;Decreased activity tolerance;Impaired balance (sitting and/or standing);Decreased safety awareness;Decreased cognition;Decreased coordination;Decreased knowledge of use of DME or AE;Decreased knowledge of precautions;Impaired UE functional use      OT Treatment/Interventions: Self-care/ADL training;Therapeutic exercise;Neuromuscular education;Energy conservation;DME and/or AE instruction;Manual therapy;Modalities;Therapeutic activities;Cognitive remediation/compensation;Patient/family education;Balance training    OT Goals(Current goals can be found in the care plan section) Acute Rehab OT Goals Patient Stated Goal: return home OT Goal Formulation: Patient unable to participate in goal setting Time For Goal Achievement: 10/15/19 Potential to Achieve Goals: Good  OT Frequency: Min 2X/week   Barriers to D/C: Decreased caregiver support          Co-evaluation PT/OT/SLP Co-Evaluation/Treatment: Yes Reason for  Co-Treatment: Necessary to address cognition/behavior during functional activity;To address functional/ADL transfers;For patient/therapist safety   OT goals addressed during session: ADL's and self-care;Strengthening/ROM      AM-PAC OT "6 Clicks" Daily Activity     Outcome Measure Help from  another person eating meals?: A Little Help from another person taking care of personal grooming?: A Lot Help from another person toileting, which includes using toliet, bedpan, or urinal?: A Lot Help from another person bathing (including washing, rinsing, drying)?: A Lot Help from another person to put on and taking off regular upper body clothing?: A Lot Help from another person to put on and taking off regular lower body clothing?: A Lot 6 Click Score: 13   End of Session Equipment Utilized During Treatment: Gait belt;Rolling walker Nurse Communication: Mobility status;Precautions  Activity Tolerance: Patient tolerated treatment well Patient left: in bed;with call bell/phone within reach  OT Visit Diagnosis: Unsteadiness on feet (R26.81);Muscle weakness (generalized) (M62.81);Hemiplegia and hemiparesis Hemiplegia - Right/Left: Right Hemiplegia - dominant/non-dominant: Dominant Hemiplegia - caused by: Cerebral infarction                Time: 3736-6815 OT Time Calculation (min): 29 min Charges:  OT General Charges $OT Visit: 1 Visit OT Evaluation $OT Eval Moderate Complexity: 1 Mod   Brynn, OTR/L  Acute Rehabilitation Services Pager: [phone] Office: [phone] .   Mateo Flow 10/01/2019, 2:05 PM

## 2019-10-01 NOTE — ED Notes (Signed)
Pt incontinent of urine. Pt cleaned, linens changed, pt repositioned, and purewick placed.

## 2019-10-01 NOTE — Progress Notes (Signed)
PROGRESS NOTE                                                                                                                                                                                                             Patient Demographics:    Alyssa Adams, is a 86 y.o. female, DOB - [birth year], XTG:[id number]  Admit date - 09/30/2019   Admitting Physician Albertine Patricia, MD  Outpatient Primary MD for the patient is [place] Pink, MD  LOS - 0   Chief Complaint  Patient presents with  . Code Stroke       Brief Narrative   86 y.o. female with medical history significant of stroke with residual right-sided weakness, HTN, GERD, depression who presented with slurred speech and altered mental status.  Patient has some short-term memory deficits, patient with known history of CVA in the past, with known residual right-sided weakness , was significant for small acute infarct in the posterior medial left temporal lobe.   Subjective:    Alyssa Adams today denies any chest pain, shortness of breath, no nausea or vomiting, reports her speech and confusion appears to be improved.   Assessment  & Plan :    Active Problems:   Hypertensive emergency   History of stroke in adulthood   Essential hypertension   GERD (gastroesophageal reflux disease)   Depression   Acute CVA -Patient presents with slurred speech, and altered mental status, this appears to be improving, her MRI significant for acute CVA in left temporal region. -Head and neck with no evidence of large vessel occlusion, but with chronic calcification in the left ICA and ACA and MCA origin stenosis, as well as mild distal right vertebral artery stenosis. -Need to monitor on telemetry. -Follow on 2D echo results. -PT/OT/SLP consulted. -Full evaluation, will await official SLP evaluation, for now we will keep n.p.o., will start on IV fluid and will monitor CBG every 4 hours. -Currently  on aspirin and Plavix  COVID-19 Labs  No results for input(s): DDIMER, FERRITIN, LDH, CRP in the last 72 hours.  Lab Results  Component Value Date   [place] NEGATIVE 09/30/2019   Essential hypertension/hypertensive urgency -Blood pressure significantly elevated on presentation 196/80, given now confirmed acute CVA, will allow for permissive hypertension.  GERD -Continue with PPI    Code Status : Full  Disposition Plan  :  Status is: Inpatient  Remains inpatient appropriate because:Unsafe d/c plan   Dispo: The patient is from: Home              Anticipated d/c is to: [facility]              Anticipated d/c date is: 3days              Patient currently is not medically stable to d/c.         Barriers For Discharge : Remains with acute work-up for CVA, she remains n.p.o., did not pass swallow evaluation  Consults  :  neuro  Procedures  : None  DVT Prophylaxis  :  [place] heparin  Lab Results  Component Value Date   PLT 208 10/01/2019    Antibiotics  :    Anti-infectives (From admission, onward)   None        Objective:   Vitals:   10/01/19 1600 10/01/19 1630 10/01/19 1645 10/01/19 1700  BP: (!) 153/71 (!) 166/68 (!) 169/74 (!) 172/70  Pulse: 65 72 69 66  Resp: Temp:      TempSrc:      SpO2: 100% 96% 95% 96%  Weight:        Wt Readings from Last 3 Encounters:  09/30/19 69.5 kg  04/26/16 68 kg  03/17/16 69.1 kg    No intake or output data in the 24 hours ending 10/01/19 1722   Physical Exam  Awake Alert, pleasant, mildly confused Symmetrical Chest wall movement, Good air movement bilaterally, CTAB RRR,No Gallops,Rubs or new Murmurs, No Parasternal Heave +ve B.Sounds, Abd Soft, No tenderness, No organomegaly appriciated, No rebound - guarding or rigidity. No Cyanosis, Clubbing or edema, No new Rash or bruise, patient with chronic right-sided spastic hemiparesis    Data Review:    CBC Recent Labs  Lab 09/30/19 1653  09/30/19 1707 10/01/19 0347  WBC 7.6  --  9.1  HGB 13.6 13.6 12.9  HCT 41.8 40.0 39.3  PLT 253  --  208  MCV 97.4  --  97.0  MCH 31.7  --  31.9  MCHC 32.5  --  32.8  RDW 13.4  --  13.2  LYMPHSABS 2.2  --   --   MONOABS 0.5  --   --   EOSABS 0.3  --   --   BASOSABS 0.0  --   --     Chemistries  Recent Labs  Lab 09/30/19 1653 09/30/19 1707 10/01/19 0347  NA 141 140 142  K 4.8 5.4* 3.3*  CL 108 105 106  CO2 24  --  23  GLUCOSE 113* 108* 116*  BUN CREATININE 0.84 0.80 0.89  CALCIUM 9.0  --  9.1  AST 35  --  19  ALT 15  --  20  ALKPHOS 69  --  78  BILITOT 1.7*  --  0.9   ------------------------------------------------------------------------------------------------------------------ Recent Labs    10/01/19 0347  CHOL 200  HDL 55  LDLCALC 111*  TRIG 170*  CHOLHDL 3.6    Lab Results  Component Value Date   HGBA1C 5.6 10/01/2019   ------------------------------------------------------------------------------------------------------------------ No results for input(s): TSH, T4TOTAL, T3FREE, THYROIDAB in the last 72 hours.  Invalid input(s): FREET3 ------------------------------------------------------------------------------------------------------------------ Recent Labs    10/01/19 0347  VITAMINB12 131*    Coagulation profile No results for input(s): INR, PROTIME in the last 168 hours.  No results for input(s):  DDIMER in the last 72 hours.  Cardiac Enzymes No results for input(s): CKMB, TROPONINI, MYOGLOBIN in the last 168 hours.  Invalid input(s): CK ------------------------------------------------------------------------------------------------------------------ No results found for: BNP  Inpatient Medications  Scheduled Meds: . aspirin EC  81 mg Oral Daily  . atorvastatin  40 mg Oral Daily  . clopidogrel  75 mg Oral Daily  . mirabegron ER  50 mg Oral Daily  . sodium chloride flush  3 mL Intravenous Once   Continuous  Infusions: PRN Meds:.acetaminophen **OR** acetaminophen (TYLENOL) oral liquid 160 mg/5 mL **OR** acetaminophen, ondansetron (ZOFRAN) IV, senna-docusate  Micro Results Recent Results (from the past 240 hour(s))  Respiratory Panel by RT PCR (Flu A&B, Covid) - Nasopharyngeal Swab     Status: None   Collection Time: 09/30/19  5:32 PM   Specimen: Nasopharyngeal Swab  Result Value Ref Range Status   SARS Coronavirus 2 by RT PCR NEGATIVE NEGATIVE Final    Comment: (NOTE) SARS-CoV-2 target nucleic acids are NOT DETECTED. The SARS-CoV-2 RNA is generally detectable in upper respiratoy specimens during the acute phase of infection. The lowest concentration of SARS-CoV-2 viral copies this assay can detect is 131 copies/mL. A negative result does not preclude SARS-Cov-2 infection and should not be used as the sole basis for treatment or other patient management decisions. A negative result may occur with  improper specimen collection/handling, submission of specimen other than nasopharyngeal swab, presence of viral mutation(s) within the areas targeted by this assay, and inadequate number of viral copies (<131 copies/mL). A negative result must be combined with clinical observations, patient history, and epidemiological information. The expected result is Negative. Fact Sheet for Patients:  https://www.moore.com/ Fact Sheet for Healthcare Providers:  https://www.young.biz/ This test is not yet ap proved or cleared by the [place] FDA and  has been authorized for detection and/or diagnosis of SARS-CoV-2 by FDA under an Emergency Use Authorization (EUA). This EUA will remain  in effect (meaning this test can be used) for the duration of the COVID-19 declaration under Section 564(b)(1) of the Act, 21 U.S.C. section 360bbb-3(b)(1), unless the authorization is terminated or revoked sooner.    Influenza A by PCR NEGATIVE NEGATIVE Final   Influenza B by PCR  NEGATIVE NEGATIVE Final    Comment: (NOTE) The Xpert Xpress SARS-CoV-2/FLU/RSV assay is intended as an aid in  the diagnosis of influenza from Nasopharyngeal swab specimens and  should not be used as a sole basis for treatment. Nasal washings and  aspirates are unacceptable for Xpert Xpress SARS-CoV-2/FLU/RSV  testing. Fact Sheet for Patients: https://www.moore.com/ Fact Sheet for Healthcare Providers: https://www.young.biz/ This test is not yet approved or cleared by the [place] FDA and  has been authorized for detection and/or diagnosis of SARS-CoV-2 by  FDA under an Emergency Use Authorization (EUA). This EUA will remain  in effect (meaning this test can be used) for the duration of the  Covid-19 declaration under Section 564(b)(1) of the Act, 21  U.S.C. section 360bbb-3(b)(1), unless the authorization is  terminated or revoked. Performed at [facility], [address], [place], [place] [address]     Radiology Reports CT Code Stroke CTA Head W/WO contrast  Result Date: 09/30/2019 CLINICAL DATA:  Altered mental status.  Weakness. EXAM: CT ANGIOGRAPHY HEAD AND NECK TECHNIQUE: Multidetector CT imaging of the head and neck was performed using the standard protocol during bolus administration of intravenous contrast. Multiplanar CT image reconstructions and MIPs were obtained to evaluate the vascular anatomy. Carotid stenosis measurements (when  applicable) are obtained utilizing NASCET criteria, using the distal internal carotid diameter as the denominator. CONTRAST:  3mL OMNIPAQUE IOHEXOL 350 MG/ML SOLN COMPARISON:  None. FINDINGS: CTA NECK FINDINGS Aortic arch: Standard 3 vessel aortic arch with minimal atherosclerotic plaque. Widely patent arch vessel origins. Right carotid system: Patent with a moderate amount of predominantly calcified plaque at the carotid bifurcation. No evidence of significant stenosis or dissection. Tortuous  proximal common carotid artery. Left carotid system: Patent with extensive calcified plaque at the carotid bifurcation resulting in less than 50% narrowing of the ICA origin. Tortuous proximal common carotid artery. Vertebral arteries: Patent and codominant without evidence of stenosis or dissection. Skeleton: Moderate diffuse cervical disc degeneration. Other neck: No evidence of cervical lymphadenopathy or mass. Upper chest: Clear lung apices. Review of the MIP images confirms the above findings CTA HEAD FINDINGS Anterior circulation: The internal carotid arteries are patent from skull base to carotid termini. Calcified plaque in the cavernous and proximal supraclinoid segments bilaterally does not result in significant stenosis. There is a punctate calcification at the left ICA terminus, favored to be chronic as it was likely faintly visible on a 2006 head CT. This likely results in moderate left MCA origin stenosis and potentially severe left ACA origin stenosis. The left A1 segment is hypoplastic, and the left ACA is primarily supplied from the right via the anterior communicating artery. The right A1 segment is widely patent. Both MCAs are patent without evidence of proximal branch occlusion. There are mild left M1 and proximal right M2 stenoses. No aneurysm is identified. Posterior circulation: The intracranial vertebral arteries are patent to the basilar with calcified plaque resulting in mild proximal right V4 stenosis. Patent PICAs and SCAs are seen bilaterally. The basilar artery is patent and small in caliber congenitally. There are large posterior communicating arteries with hypoplastic right and absent left posterior communicating arteries. The PCAs are patent with branch vessel irregularity but no significant proximal stenosis. No aneurysm is identified. Venous sinuses: Poorly evaluated due to contrast timing. Anatomic variants: Predominantly fetal origin of the PCAs. Review of the MIP images confirms  the above findings IMPRESSION: 1. No large vessel occlusion. 2. Suspected chronic calcification at the left ICA terminus resulting in ACA and MCA origin stenoses. 3. Mild distal right vertebral artery stenosis. 4. Left greater than right cervical carotid artery atherosclerosis without significant stenosis. These results were communicated to Dr. Wilford Corner at 5:20 pm on 09/30/2019 by text page via the [facility] messaging system. Electronically Signed   By: Sebastian Ache M.D.   On: 09/30/2019 17:34   DG Chest 1 View  Result Date: 09/30/2019 CLINICAL DATA:  Slurred speech EXAM: CHEST  1 VIEW COMPARISON:  06/07/2012 FINDINGS: The heart size and mediastinal contours are within normal limits. Both lungs are clear. The visualized skeletal structures are unremarkable. IMPRESSION: No active disease. Electronically Signed   By: Jasmine Pang M.D.   On: 09/30/2019 22:51   CT Code Stroke CTA Neck W/WO contrast  Result Date: 09/30/2019 CLINICAL DATA:  Altered mental status.  Weakness. EXAM: CT ANGIOGRAPHY HEAD AND NECK TECHNIQUE: Multidetector CT imaging of the head and neck was performed using the standard protocol during bolus administration of intravenous contrast. Multiplanar CT image reconstructions and MIPs were obtained to evaluate the vascular anatomy. Carotid stenosis measurements (when applicable) are obtained utilizing NASCET criteria, using the distal internal carotid diameter as the denominator. CONTRAST:  33mL OMNIPAQUE IOHEXOL 350 MG/ML SOLN COMPARISON:  None. FINDINGS: CTA NECK FINDINGS Aortic arch: Standard 3  vessel aortic arch with minimal atherosclerotic plaque. Widely patent arch vessel origins. Right carotid system: Patent with a moderate amount of predominantly calcified plaque at the carotid bifurcation. No evidence of significant stenosis or dissection. Tortuous proximal common carotid artery. Left carotid system: Patent with extensive calcified plaque at the carotid bifurcation resulting in less than 50%  narrowing of the ICA origin. Tortuous proximal common carotid artery. Vertebral arteries: Patent and codominant without evidence of stenosis or dissection. Skeleton: Moderate diffuse cervical disc degeneration. Other neck: No evidence of cervical lymphadenopathy or mass. Upper chest: Clear lung apices. Review of the MIP images confirms the above findings CTA HEAD FINDINGS Anterior circulation: The internal carotid arteries are patent from skull base to carotid termini. Calcified plaque in the cavernous and proximal supraclinoid segments bilaterally does not result in significant stenosis. There is a punctate calcification at the left ICA terminus, favored to be chronic as it was likely faintly visible on a 2006 head CT. This likely results in moderate left MCA origin stenosis and potentially severe left ACA origin stenosis. The left A1 segment is hypoplastic, and the left ACA is primarily supplied from the right via the anterior communicating artery. The right A1 segment is widely patent. Both MCAs are patent without evidence of proximal branch occlusion. There are mild left M1 and proximal right M2 stenoses. No aneurysm is identified. Posterior circulation: The intracranial vertebral arteries are patent to the basilar with calcified plaque resulting in mild proximal right V4 stenosis. Patent PICAs and SCAs are seen bilaterally. The basilar artery is patent and small in caliber congenitally. There are large posterior communicating arteries with hypoplastic right and absent left posterior communicating arteries. The PCAs are patent with branch vessel irregularity but no significant proximal stenosis. No aneurysm is identified. Venous sinuses: Poorly evaluated due to contrast timing. Anatomic variants: Predominantly fetal origin of the PCAs. Review of the MIP images confirms the above findings IMPRESSION: 1. No large vessel occlusion. 2. Suspected chronic calcification at the left ICA terminus resulting in ACA and MCA  origin stenoses. 3. Mild distal right vertebral artery stenosis. 4. Left greater than right cervical carotid artery atherosclerosis without significant stenosis. These results were communicated to Dr. Wilford Corner at 5:20 pm on 09/30/2019 by text page via the [facility] messaging system. Electronically Signed   By: Sebastian Ache M.D.   On: 09/30/2019 17:34   MR BRAIN WO CONTRAST  Result Date: 09/30/2019 CLINICAL DATA:  Possible stroke. EXAM: MRI HEAD WITHOUT CONTRAST TECHNIQUE: Multiplanar, multiecho pulse sequences of the brain and surrounding structures were obtained without intravenous contrast. COMPARISON:  Brain MRI 07/19/2005 FINDINGS: Brain: Small focus of abnormal diffusion restriction along the posteromedial left temporal lobe. No other diffusion abnormality. Diffuse confluent hyperintense T2-weighted signal within the periventricular, deep and juxtacortical white matter, most commonly due to chronic ischemic microangiopathy. There is generalized atrophy without lobar predilection. There are a few chronic microhemorrhages in a predominantly central distribution. Normal midline structures. Vascular: Normal flow voids. Skull and upper cervical spine: Normal marrow signal. Sinuses/Orbits: Negative. Other: None. IMPRESSION: 1. Small focus of abnormal diffusion restriction along the posteromedial left temporal lobe, consistent with a small acute infarct. 2. Severe chronic ischemic microangiopathy and generalized atrophy. Chronic micro 3. Chronic microhemorrhage within a predominantly central distribution, likely indicating chronic hypertensive angiopathy. Electronically Signed   By: Deatra Robinson M.D.   On: 09/30/2019 23:42   ECHOCARDIOGRAM COMPLETE BUBBLE STUDY  Result Date: 10/01/2019    ECHOCARDIOGRAM REPORT   Patient Name:   Alyssa  Meribeth Adams Date of Exam: 10/01/2019 Medical Rec #:  [id number]         Height:       62.0 in Accession #:    [id number]        Weight:       153.2 lb Date of Birth:  February 24, 1934          BSA:          1.707 m Patient Age:    85 years          BP:           169/72 mmHg Patient Gender: F                 HR:           63 bpm. Exam Location:  Inpatient Procedure: 2D Echo and Saline Contrast Bubble Study Indications:    Stroke  History:        Patient has no prior history of Echocardiogram examinations.                 Risk Factors:Hypertension.  Sonographer:    Thurman Coyer RDCS (AE) Referring Phys: [id number] NA LI IMPRESSIONS  1. Left ventricular ejection fraction, by estimation, is 70 to 75%. The left ventricle has hyperdynamic function. The left ventricle has no regional wall motion abnormalities. Left ventricular diastolic parameters are consistent with Grade I diastolic dysfunction (impaired relaxation).  2. Right ventricular systolic function is normal. The right ventricular size is normal. Tricuspid regurgitation signal is inadequate for assessing PA pressure.  3. The mitral valve is grossly normal. No evidence of mitral valve regurgitation. No evidence of mitral stenosis.  4. The aortic valve is tricuspid. Aortic valve regurgitation is not visualized. No aortic stenosis is present.  5. The inferior vena cava is normal in size with greater than 50% respiratory variability, suggesting right atrial pressure of 3 mmHg.  6. Agitated saline contrast bubble study was negative, with no evidence of any interatrial shunt. Conclusion(s)/Recommendation(s): No intracardiac source of embolism detected on this transthoracic study. A transesophageal echocardiogram is recommended to exclude cardiac source of embolism if clinically indicated. FINDINGS  Left Ventricle: Left ventricular ejection fraction, by estimation, is 70 to 75%. The left ventricle has hyperdynamic function. The left ventricle has no regional wall motion abnormalities. The left ventricular internal cavity size was normal in size. There is no left ventricular hypertrophy. Left ventricular diastolic parameters are consistent with Grade I  diastolic dysfunction (impaired relaxation). Right Ventricle: The right ventricular size is normal. No increase in right ventricular wall thickness. Right ventricular systolic function is normal. Tricuspid regurgitation signal is inadequate for assessing PA pressure. Left Atrium: Left atrial size was normal in size. Right Atrium: Right atrial size was normal in size. Pericardium: Trivial pericardial effusion is present. Presence of pericardial fat pad. Mitral Valve: The mitral valve is grossly normal. No evidence of mitral valve regurgitation. No evidence of mitral valve stenosis. Tricuspid Valve: The tricuspid valve is grossly normal. Tricuspid valve regurgitation is not demonstrated. No evidence of tricuspid stenosis. Aortic Valve: The aortic valve is tricuspid. Aortic valve regurgitation is not visualized. No aortic stenosis is present. Pulmonic Valve: The pulmonic valve was grossly normal. Pulmonic valve regurgitation is not visualized. No evidence of pulmonic stenosis. Aorta: The aortic root and ascending aorta are structurally normal, with no evidence of dilitation. Venous: The inferior vena cava is normal in size with greater than 50% respiratory variability, suggesting right atrial pressure of 3 mmHg. IAS/Shunts:  The atrial septum is grossly normal. Agitated saline contrast was given intravenously to evaluate for intracardiac shunting. Agitated saline contrast bubble study was negative, with no evidence of any interatrial shunt.  LEFT VENTRICLE PLAX 2D LVIDd:         3.60 cm  Diastology LVIDs:         2.40 cm  LV e' lateral:   6.22 cm/s LV PW:         1.00 cm  LV E/e' lateral: 10.0 LV IVS:        1.00 cm  LV e' medial:    5.33 cm/s LVOT diam:     1.80 cm  LV E/e' medial:  11.7 LV SV:         68 LV SV Index:   40 LVOT Area:     2.54 cm  RIGHT VENTRICLE RV S prime:     16.60 cm/s TAPSE (M-mode): 2.1 cm LEFT ATRIUM             Index       RIGHT ATRIUM           Index LA diam:        2.30 cm 1.35 cm/m  RA  Area:     10.10 cm LA Vol (A2C):   46.1 ml 27.01 ml/m RA Volume:   18.20 ml  10.66 ml/m LA Vol (A4C):   44.2 ml 25.89 ml/m LA Biplane Vol: 45.1 ml 26.42 ml/m  AORTIC VALVE LVOT Vmax:   124.00 cm/s LVOT Vmean:  96.900 cm/s LVOT VTI:    0.268 m  AORTA Ao Root diam: 2.60 cm MITRAL VALVE MV Area (PHT): 1.65 cm     SHUNTS MV Decel Time: 461 msec     Systemic VTI:  0.27 m MV E velocity: 62.10 cm/s   Systemic Diam: 1.80 cm MV A velocity: 101.00 cm/s MV E/A ratio:  0.61 Lennie OdorWesley O'Neal MD Electronically signed by Lennie OdorWesley O'Neal MD Signature Date/Time: 10/01/2019/3:47:30 PM    Final    CT HEAD CODE STROKE WO CONTRAST  Result Date: 09/30/2019 CLINICAL DATA:  Code stroke. Possible stroke, ataxia, stroke suspected. Additional history provided: Last known well 1300 EXAM: CT HEAD WITHOUT CONTRAST TECHNIQUE: Contiguous axial images were obtained from the base of the skull through the vertex without intravenous contrast. COMPARISON:  Brain MRI 07/19/2005, brain MRI 08/01/2004, head CT 07/31/2004 FINDINGS: Brain: There is no evidence of acute intracranial hemorrhage. Ischemic changes within the left lentiform nucleus, progressed as compared to MRI 07/19/2005, but otherwise age indeterminate. Redemonstrated chronic lacunar infarct within the left corona radiata. Background advanced chronic small vessel ischemic disease and moderate generalized parenchymal atrophy, also progressed. There is no evidence of intracranial mass. No midline shift or extra-axial fluid collection. Vascular: No hyperdense vessel.  Atherosclerotic calcifications. Skull: Normal. Negative for fracture or focal lesion. Sinuses/Orbits: Visualized orbits demonstrate no acute abnormality. No significant paranasal sinus disease or mastoid effusion at the imaged levels. ASPECTS ([place] Stroke Program Early CT Score) - Ganglionic level infarction (caudate, lentiform nuclei, internal capsule, insula, M1-M3 cortex): 6 (point subtracted for age-indeterminate  ischemic changes within the left lentiform nucleus) - Supraganglionic infarction (M4-M6 cortex): 3 Total score (0-10 with 10 being normal): 9 These results were called by telephone at the time of interpretation on 09/30/2019 at 5:14 pm to provider Dr. Wilford CornerArora, who verbally acknowledged these results. IMPRESSION: 1. Age-indeterminate ischemic changes within the left lentiform nucleus. Aspects 9. 2. No evidence of acute intracranial hemorrhage or acute demarcated cortical infarct.  3. Advanced chronic small vessel ischemic disease has progressed as compared to prior MRI 07/19/2005. Redemonstrated chronic lacunar infarct within the left corona radiata. 4. Moderate generalized parenchymal atrophy. Electronically Signed   By: Jackey Loge DO   On: 09/30/2019 17:17     Mliss Fritz Rinnah Peppel M.D on 10/01/2019 at 5:22 PM  Between 7am to 7pm - Pager - [phone]  After 7pm go to [email or web address] - password [email or web address] [facility]  [facility] -  Office  [phone]

## 2019-10-02 ENCOUNTER — Encounter (HOSPITAL_COMMUNITY): Payer: Self-pay | Admitting: Internal Medicine

## 2019-10-02 DIAGNOSIS — I63332 Cerebral infarction due to thrombosis of left posterior cerebral artery: Principal | ICD-10-CM

## 2019-10-02 LAB — BASIC METABOLIC PANEL
Anion gap: 9 (ref 5–15)
BUN: 13 mg/dL (ref 8–23)
CO2: 26 mmol/L (ref 22–32)
Calcium: 9 mg/dL (ref 8.9–10.3)
Chloride: 107 mmol/L (ref 98–111)
Creatinine, Ser: 0.89 mg/dL (ref 0.44–1.00)
GFR calc Af Amer: >60 mL/min (ref >60)
GFR calc non Af Amer: 60 mL/min — ABNORMAL LOW (ref >60)
Glucose, Bld: 107 mg/dL — ABNORMAL HIGH (ref 70–99)
Potassium: 3.3 mmol/L — ABNORMAL LOW (ref 3.5–5.1)
Sodium: 142 mmol/L (ref 135–145)

## 2019-10-02 LAB — CBC
HCT: 39.2 % (ref 36.0–46.0)
Hemoglobin: 12.9 g/dL (ref 12.0–15.0)
MCH: 31.7 pg (ref 26.0–34.0)
MCHC: 32.9 g/dL (ref 30.0–36.0)
MCV: 96.3 fL (ref 80.0–100.0)
Platelets: 216 10*3/uL (ref 150–400)
RBC: 4.07 MIL/uL (ref 3.87–5.11)
RDW: 13.2 % (ref 11.5–15.5)
WBC: 6.8 10*3/uL (ref 4.0–10.5)
nRBC: 0 % (ref 0.0–0.2)

## 2019-10-02 LAB — CBG MONITORING, ED: Glucose-Capillary: 105 mg/dL — ABNORMAL HIGH (ref 70–99)

## 2019-10-02 MED ORDER — POTASSIUM CHLORIDE CRYS ER 20 MEQ PO TBCR
40.0000 meq | EXTENDED_RELEASE_TABLET | Freq: Once | ORAL | Status: AC
  Administered 2019-10-02: 40 meq via ORAL
  Filled 2019-10-02: qty 2

## 2019-10-02 MED ORDER — RESOURCE THICKENUP CLEAR PO POWD
ORAL | Status: DC | PRN
  Filled 2019-10-02: qty 125

## 2019-10-02 MED ORDER — PANTOPRAZOLE SODIUM 40 MG PO TBEC
40.0000 mg | DELAYED_RELEASE_TABLET | Freq: Every day | ORAL | Status: DC
  Administered 2019-10-02 – 2019-10-06 (×5): 40 mg via ORAL
  Filled 2019-10-02 (×5): qty 1

## 2019-10-02 MED ORDER — CHOLESTYRAMINE LIGHT 4 G PO PACK
4.0000 g | PACK | Freq: Once | ORAL | Status: AC
  Administered 2019-10-02: 4 g via ORAL
  Filled 2019-10-02: qty 1

## 2019-10-02 MED ORDER — BACLOFEN 10 MG PO TABS
5.0000 mg | ORAL_TABLET | Freq: Every day | ORAL | Status: DC
  Administered 2019-10-02 – 2019-10-05 (×4): 5 mg via ORAL
  Filled 2019-10-02 (×4): qty 1

## 2019-10-02 MED ORDER — CHOLESTYRAMINE LIGHT 4 G PO PACK
4.0000 g | PACK | Freq: Every day | ORAL | Status: DC
  Administered 2019-10-03 – 2019-10-06 (×4): 4 g via ORAL
  Filled 2019-10-02 (×4): qty 1

## 2019-10-02 NOTE — Evaluation (Signed)
Speech Language Pathology Evaluation Patient Details Name: Alyssa Adams MRN: [id number] DOB: June 08, 1934 Today's Date: 10/02/2019 Time: 7989-2119 SLP Time Calculation (min) (ACUTE ONLY): 15 min  Problem List:  Patient Active Problem List   Diagnosis Date Noted  . Hypertensive emergency 09/30/2019  . History of stroke in adulthood 09/30/2019  . Essential hypertension 09/30/2019  . GERD (gastroesophageal reflux disease) 09/30/2019  . Depression 09/30/2019   Past Medical History:  Past Medical History:  Diagnosis Date  . Depression   . GERD (gastroesophageal reflux disease)   . Hypertension   . Stroke (cerebrum) [facility])    Past Surgical History:  Past Surgical History:  Procedure Laterality Date  . ABDOMINAL HYSTERECTOMY    . GALLBLADDER SURGERY     HPI:  Pt is 86 y.o. female with medical history of stroke with residual right-sided weakness, HTN, GERD, depression who presented with slurred speech and altered mental status. MRI revealed small abnormal diffusion restriction along the posteromedial L temporal lobe consistent with small acute infarct. CXR no active disease.   Assessment / Plan / Recommendation Clinical Impression  Alyssa Adams states she had significant difficulty speaking on admission but has greatly improved. Language is fluent expressing herself in conversation with intermittent word finding difficulty. Decreased accuracy with abstract commands ("before.Marland Kitchenafter.Marland Kitchen) possibly due to decreased sustained attention. Will further assess pt's executive functioning in diagnostic treatment. Pt reports her daughter is responsible for her finances and states she doesn't take much medication ("like I am supposed to"). Pt would benefit from continued ST on acute care and recommend rehab for semantics, intended word accuracy and diagnostic assessment of executive functions.                                     SLP Assessment  SLP Recommendation/Assessment: Patient needs continued  Speech Lanaguage Pathology Services SLP Visit Diagnosis: Aphasia (R47.01)    Follow Up Recommendations  Inpatient Rehab    Frequency and Duration min 2x/week  2 weeks      SLP Evaluation Cognition  Overall Cognitive Status: Impaired/Different from baseline Arousal/Alertness: Awake/alert Orientation Level: Oriented to person;Oriented to situation;Oriented to place Attention: Sustained Sustained Attention: Appears intact Memory: (TBA further) Awareness: Appears intact Problem Solving: Impaired Problem Solving Impairment: Functional complex;Verbal complex Safety/Judgment: Impaired       Comprehension  Auditory Comprehension Overall Auditory Comprehension: Appears within functional limits for tasks assessed Conversation: (min impairments- may be due to attention versus comprehensio) Interfering Components: Attention(attention?) EffectiveTechniques: Repetition Visual Recognition/Discrimination Discrimination: Not tested Reading Comprehension Reading Status: (TBA)    Expression Expression Primary Mode of Expression: Verbal Verbal Expression Overall Verbal Expression: Impaired Initiation: No impairment Level of Generative/Spontaneous Verbalization: Conversation Repetition: No impairment Naming: (isolated naming WNL's, difficulty in conversation) Pragmatics: No impairment Written Expression Dominant Hand: Right Written Expression: (TBA)   Oral / Motor  Oral Motor/Sensory Function Overall Oral Motor/Sensory Function: Mild impairment Facial ROM: Reduced right;Suspected CN VII (facial) dysfunction Facial Symmetry: Abnormal symmetry right;Suspected CN VII (facial) dysfunction Facial Strength: Reduced right;Suspected CN VII (facial) dysfunction Lingual ROM: Within Functional Limits Lingual Symmetry: Within Functional Limits Lingual Strength: Within Functional Limits Motor Speech Overall Motor Speech: Appears within functional limits for tasks assessed Respiration: Within  functional limits Phonation: Normal Resonance: Within functional limits Articulation: Within functional limitis Intelligibility: Intelligible Motor Planning: Witnin functional limits   GO  Houston Siren 10/02/2019, 1:25 PM  Orbie Pyo Colvin Caroli.Ed Risk analyst [phone] Office [phone]

## 2019-10-02 NOTE — Progress Notes (Signed)
Inpatient Rehabilitation Admissions Coordinator  I met at bedside with daughter,Lynn and patient . We dicussed the recommendation for CIR and then d/c home with 24/7 min assist that we would recommend. Jeani Hawking states that level of care can not be provided and therefore she would prefer SNF rehab to give a longer recovery before eventually returning home. I will alert Dr. Waldron Labs, SW, [place] and acute care team and we will sign off. Please call me with any questions.  Danne Baxter, RN, MSN Rehab Admissions Coordinator [phone] 10/02/2019 2:52 PM

## 2019-10-02 NOTE — Progress Notes (Signed)
PROGRESS NOTE                                                                                                                                                                                                             Patient Demographics:    Alyssa Adams, is a 86 y.o. female, DOB - 05/25/1934, ZOX:[id number]  Admit date - 09/30/2019   Admitting Physician Starleen Armsawood S Valena Ivanov, MD  Outpatient Primary MD for the patient is Jerl MinaHedrick, James, MD  LOS - 1   Chief Complaint  Patient presents with   Code Stroke       Brief Narrative   86 y.o. female with medical history significant of stroke with residual right-sided weakness, HTN, GERD, depression who presented with slurred speech and altered mental status.  Patient has some short-term memory deficits, patient with known history of CVA in the past, with known residual right-sided weakness , was significant for small acute infarct in the posterior medial left temporal lobe.   Subjective:    Alyssa LotJanie Fare today denies any chest pain, shortness of breath, no nausea or vomiting, she denies any new focal deficits.   Assessment  & Plan :    Active Problems:   Hypertensive emergency   History of stroke in adulthood   Essential hypertension   GERD (gastroesophageal reflux disease)   Depression   Acute CVA -Patient presents with slurred speech, and altered mental status, this appears to be improving, her MRI significant for acute CVA in left temporal region. -Head and neck with no evidence of large vessel occlusion, but with chronic calcification in the left ICA and ACA and MCA origin stenosis, as well as mild distal right vertebral artery stenosis, neurology input greatly appreciated, current recommendation is for dual antiplatelet therapy aspirin and Plavix for 3 weeks, then aspirin alone. -2D echo with EF 70 to 75%. -PT/OT/SLP consulted, recommendation for CIR, as well she was advanced to dysphagia  to nectar thick liquids.  COVID-19 Labs  No results for input(s): DDIMER, FERRITIN, LDH, CRP in the last 72 hours.  Lab Results  Component Value Date   SARSCOV2NAA NEGATIVE 09/30/2019   Essential hypertension/hypertensive urgency -Blood pressure significantly elevated on presentation 196/80, given now confirmed acute CVA, will allow for permissive hypertension, and will aim for gradual control over 2 to 3 days.  History of CVA -With  spastic right-sided hemiparesis, will start on baclofen per rehab recommendation.  GERD -Continue with PPI  Hyperlipidemia -LDL is 111, now on Lipitor.    Code Status : Full  Family communication: Discussed with daughter via phone   Disposition Plan  :  Status is: Inpatient  Remains inpatient appropriate because:Unsafe d/c plan   Dispo: The patient is from: Home              Anticipated d/c is to: CIR              Anticipated d/c date is: When patient is approved for CIR and bed is available.              Patient currently is not medically stable to d/c.  She remains on dysphagia 2 nectar thick diet  Consults  :  Neuro, rehab  Procedures  : None  DVT Prophylaxis  :  [place] heparin  Lab Results  Component Value Date   PLT 216 10/02/2019    Antibiotics  :    Anti-infectives (From admission, onward)   None        Objective:   Vitals:   10/02/19 0034 10/02/19 0100 10/02/19 0231 10/02/19 0737  BP: (!) 164/68 (!) 170/70 (!) 173/70 (!) 173/69  Pulse: 63 81 71 65  Resp: 15 16    Temp:   98.6 F (37 C) 97.9 F (36.6 C)  TempSrc:   Oral   SpO2: 98% 96% 96% 97%  Weight:   66 kg   Height:    (1.575 m)     Wt Readings from Last 3 Encounters:  10/02/19 66 kg  04/26/16 68 kg  03/17/16 69.1 kg     Intake/Output Summary (Last 24 hours) at 10/02/2019 1206 Last data filed at 10/02/2019 0500 Gross per 24 hour  Intake 788.41 ml  Output --  Net 788.41 ml     Physical Exam  Awake Alert, pleasant Symmetrical Chest wall  movement, Good air movement bilaterally, CTAB RRR,No Gallops,Rubs or new Murmurs, No Parasternal Heave +ve B.Sounds, Abd Soft, No tenderness, No rebound - guarding or rigidity. No Cyanosis, Clubbing or edema, No new Rash or bruise, patient with chronic right-sided spastic hemiparesis    Data Review:    CBC Recent Labs  Lab 09/30/19 1653 09/30/19 1707 10/01/19 0347 10/02/19 0730  WBC 7.6  --  9.1 6.8  HGB 13.6 13.6 12.9 12.9  HCT 41.8 40.0 39.3 39.2  PLT 253  --  208 216  MCV 97.4  --  97.0 96.3  MCH 31.7  --  31.9 31.7  MCHC 32.5  --  32.8 32.9  RDW 13.4  --  13.2 13.2  LYMPHSABS 2.2  --   --   --   MONOABS 0.5  --   --   --   EOSABS 0.3  --   --   --   BASOSABS 0.0  --   --   --     Chemistries  Recent Labs  Lab 09/30/19 1653 09/30/19 1707 10/01/19 0347 10/02/19 0730  NA 141 140 142 142  K 4.8 5.4* 3.3* 3.3*  CL 108 105 106 107  CO2 24  --  23 26  GLUCOSE 113* 108* 116* 107*  BUN CREATININE 0.84 0.80 0.89 0.89  CALCIUM 9.0  --  9.1 9.0  AST 35  --  19  --   ALT 15  --  20  --   ALKPHOS 69  --  78  --   BILITOT 1.7*  --  0.9  --    ------------------------------------------------------------------------------------------------------------------ Recent Labs    10/01/19 0347  CHOL 200  HDL 55  LDLCALC 111*  TRIG 170*  CHOLHDL 3.6    Lab Results  Component Value Date   HGBA1C 5.6 10/01/2019   ------------------------------------------------------------------------------------------------------------------ No results for input(s): TSH, T4TOTAL, T3FREE, THYROIDAB in the last 72 hours.  Invalid input(s): FREET3 ------------------------------------------------------------------------------------------------------------------ Recent Labs    10/01/19 0347  VITAMINB12 131*    Coagulation profile No results for input(s): INR, PROTIME in the last 168 hours.  No results for input(s): DDIMER in the last 72 hours.  Cardiac Enzymes No  results for input(s): CKMB, TROPONINI, MYOGLOBIN in the last 168 hours.  Invalid input(s): CK ------------------------------------------------------------------------------------------------------------------ No results found for: BNP  Inpatient Medications  Scheduled Meds:  aspirin EC  81 mg Oral Daily   atorvastatin  40 mg Oral Daily   [START ON 10/03/2019] cholestyramine light  4 g Oral QAC breakfast   clopidogrel  75 mg Oral Daily   heparin injection (subcutaneous)  5,000 Units Subcutaneous Q8H   mirabegron ER  50 mg Oral Daily   sodium chloride flush  3 mL Intravenous Once   Continuous Infusions:  sodium chloride 75 mL/hr at 10/01/19 1823   PRN Meds:.acetaminophen **OR** acetaminophen (TYLENOL) oral liquid 160 mg/5 mL **OR** acetaminophen, ondansetron (ZOFRAN) IV, Resource ThickenUp Clear, senna-docusate  Micro Results Recent Results (from the past 240 hour(s))  Respiratory Panel by RT PCR (Flu A&B, Covid) - Nasopharyngeal Swab     Status: None   Collection Time: 09/30/19  5:32 PM   Specimen: Nasopharyngeal Swab  Result Value Ref Range Status   SARS Coronavirus 2 by RT PCR NEGATIVE NEGATIVE Final    Comment: (NOTE) SARS-CoV-2 target nucleic acids are NOT DETECTED. The SARS-CoV-2 RNA is generally detectable in upper respiratoy specimens during the acute phase of infection. The lowest concentration of SARS-CoV-2 viral copies this assay can detect is 131 copies/mL. A negative result does not preclude SARS-Cov-2 infection and should not be used as the sole basis for treatment or other patient management decisions. A negative result may occur with  improper specimen collection/handling, submission of specimen other than nasopharyngeal swab, presence of viral mutation(s) within the areas targeted by this assay, and inadequate number of viral copies (<131 copies/mL). A negative result must be combined with clinical observations, patient history, and epidemiological  information. The expected result is Negative. Fact Sheet for Patients:  https://www.moore.com/ Fact Sheet for Healthcare Providers:  https://www.young.biz/ This test is not yet ap proved or cleared by the [place] FDA and  has been authorized for detection and/or diagnosis of SARS-CoV-2 by FDA under an Emergency Use Authorization (EUA). This EUA will remain  in effect (meaning this test can be used) for the duration of the COVID-19 declaration under Section 564(b)(1) of the Act, 21 U.S.C. section 360bbb-3(b)(1), unless the authorization is terminated or revoked sooner.    Influenza A by PCR NEGATIVE NEGATIVE Final   Influenza B by PCR NEGATIVE NEGATIVE Final    Comment: (NOTE) The Xpert Xpress SARS-CoV-2/FLU/RSV assay is intended as an aid in  the diagnosis of influenza from Nasopharyngeal swab specimens and  should not be used as a sole basis for treatment. Nasal washings and  aspirates are unacceptable for Xpert Xpress SARS-CoV-2/FLU/RSV  testing. Fact Sheet for Patients: https://www.moore.com/ Fact Sheet for Healthcare Providers: https://www.young.biz/ This test is not yet approved or cleared by the [place] and  has been authorized for detection and/or diagnosis of SARS-CoV-2 by  FDA under an Emergency Use Authorization (EUA). This EUA will remain  in effect (meaning this test can be used) for the duration of the  Covid-19 declaration under Section 564(b)(1) of the Act, 21  U.S.C. section 360bbb-3(b)(1), unless the authorization is  terminated or revoked. Performed at [facility], [place] [address], [place], [place] [address]     Radiology Reports CT Code Stroke CTA Head W/WO contrast  Result Date: 09/30/2019 CLINICAL DATA:  Altered mental status.  Weakness. EXAM: CT ANGIOGRAPHY HEAD AND NECK TECHNIQUE: Multidetector CT imaging of the head and neck was performed using the  standard protocol during bolus administration of intravenous contrast. Multiplanar CT image reconstructions and MIPs were obtained to evaluate the vascular anatomy. Carotid stenosis measurements (when applicable) are obtained utilizing NASCET criteria, using the distal internal carotid diameter as the denominator. CONTRAST:  71mL OMNIPAQUE IOHEXOL 350 MG/ML SOLN COMPARISON:  None. FINDINGS: CTA NECK FINDINGS Aortic arch: Standard 3 vessel aortic arch with minimal atherosclerotic plaque. Widely patent arch vessel origins. Right carotid system: Patent with a moderate amount of predominantly calcified plaque at the carotid bifurcation. No evidence of significant stenosis or dissection. Tortuous proximal common carotid artery. Left carotid system: Patent with extensive calcified plaque at the carotid bifurcation resulting in less than 50% narrowing of the ICA origin. Tortuous proximal common carotid artery. Vertebral arteries: Patent and codominant without evidence of stenosis or dissection. Skeleton: Moderate diffuse cervical disc degeneration. Other neck: No evidence of cervical lymphadenopathy or mass. Upper chest: Clear lung apices. Review of the MIP images confirms the above findings CTA HEAD FINDINGS Anterior circulation: The internal carotid arteries are patent from skull base to carotid termini. Calcified plaque in the cavernous and proximal supraclinoid segments bilaterally does not result in significant stenosis. There is a punctate calcification at the left ICA terminus, favored to be chronic as it was likely faintly visible on a 2006 head CT. This likely results in moderate left MCA origin stenosis and potentially severe left ACA origin stenosis. The left A1 segment is hypoplastic, and the left ACA is primarily supplied from the right via the anterior communicating artery. The right A1 segment is widely patent. Both MCAs are patent without evidence of proximal branch occlusion. There are mild left M1 and  proximal right M2 stenoses. No aneurysm is identified. Posterior circulation: The intracranial vertebral arteries are patent to the basilar with calcified plaque resulting in mild proximal right V4 stenosis. Patent PICAs and SCAs are seen bilaterally. The basilar artery is patent and small in caliber congenitally. There are large posterior communicating arteries with hypoplastic right and absent left posterior communicating arteries. The PCAs are patent with branch vessel irregularity but no significant proximal stenosis. No aneurysm is identified. Venous sinuses: Poorly evaluated due to contrast timing. Anatomic variants: Predominantly fetal origin of the PCAs. Review of the MIP images confirms the above findings IMPRESSION: 1. No large vessel occlusion. 2. Suspected chronic calcification at the left ICA terminus resulting in ACA and MCA origin stenoses. 3. Mild distal right vertebral artery stenosis. 4. Left greater than right cervical carotid artery atherosclerosis without significant stenosis. These results were communicated to Dr. Rory Percy at 5:20 pm on 09/30/2019 by text page via the [facility] messaging system. Electronically Signed   By: Logan Bores M.D.   On: 09/30/2019 17:34   DG Chest 1 View  Result Date: 09/30/2019 CLINICAL DATA:  Slurred speech EXAM: CHEST  1 VIEW COMPARISON:  06/07/2012 FINDINGS: The heart size and mediastinal contours are within normal limits. Both lungs are clear. The visualized skeletal structures are unremarkable. IMPRESSION: No active disease. Electronically Signed   By: Jasmine Pang M.D.   On: 09/30/2019 22:51   CT Code Stroke CTA Neck W/WO contrast  Result Date: 09/30/2019 CLINICAL DATA:  Altered mental status.  Weakness. EXAM: CT ANGIOGRAPHY HEAD AND NECK TECHNIQUE: Multidetector CT imaging of the head and neck was performed using the standard protocol during bolus administration of intravenous contrast. Multiplanar CT image reconstructions and MIPs were obtained to evaluate  the vascular anatomy. Carotid stenosis measurements (when applicable) are obtained utilizing NASCET criteria, using the distal internal carotid diameter as the denominator. CONTRAST:  73mL OMNIPAQUE IOHEXOL 350 MG/ML SOLN COMPARISON:  None. FINDINGS: CTA NECK FINDINGS Aortic arch: Standard 3 vessel aortic arch with minimal atherosclerotic plaque. Widely patent arch vessel origins. Right carotid system: Patent with a moderate amount of predominantly calcified plaque at the carotid bifurcation. No evidence of significant stenosis or dissection. Tortuous proximal common carotid artery. Left carotid system: Patent with extensive calcified plaque at the carotid bifurcation resulting in less than 50% narrowing of the ICA origin. Tortuous proximal common carotid artery. Vertebral arteries: Patent and codominant without evidence of stenosis or dissection. Skeleton: Moderate diffuse cervical disc degeneration. Other neck: No evidence of cervical lymphadenopathy or mass. Upper chest: Clear lung apices. Review of the MIP images confirms the above findings CTA HEAD FINDINGS Anterior circulation: The internal carotid arteries are patent from skull base to carotid termini. Calcified plaque in the cavernous and proximal supraclinoid segments bilaterally does not result in significant stenosis. There is a punctate calcification at the left ICA terminus, favored to be chronic as it was likely faintly visible on a 2006 head CT. This likely results in moderate left MCA origin stenosis and potentially severe left ACA origin stenosis. The left A1 segment is hypoplastic, and the left ACA is primarily supplied from the right via the anterior communicating artery. The right A1 segment is widely patent. Both MCAs are patent without evidence of proximal branch occlusion. There are mild left M1 and proximal right M2 stenoses. No aneurysm is identified. Posterior circulation: The intracranial vertebral arteries are patent to the basilar with  calcified plaque resulting in mild proximal right V4 stenosis. Patent PICAs and SCAs are seen bilaterally. The basilar artery is patent and small in caliber congenitally. There are large posterior communicating arteries with hypoplastic right and absent left posterior communicating arteries. The PCAs are patent with branch vessel irregularity but no significant proximal stenosis. No aneurysm is identified. Venous sinuses: Poorly evaluated due to contrast timing. Anatomic variants: Predominantly fetal origin of the PCAs. Review of the MIP images confirms the above findings IMPRESSION: 1. No large vessel occlusion. 2. Suspected chronic calcification at the left ICA terminus resulting in ACA and MCA origin stenoses. 3. Mild distal right vertebral artery stenosis. 4. Left greater than right cervical carotid artery atherosclerosis without significant stenosis. These results were communicated to Dr. Wilford Corner at 5:20 pm on 09/30/2019 by text page via the [facility] messaging system. Electronically Signed   By: Sebastian Ache M.D.   On: 09/30/2019 17:34   MR BRAIN WO CONTRAST  Result Date: 09/30/2019 CLINICAL DATA:  Possible stroke. EXAM: MRI HEAD WITHOUT CONTRAST TECHNIQUE: Multiplanar, multiecho pulse sequences of the brain and surrounding structures were obtained without intravenous contrast. COMPARISON:  Brain MRI 07/19/2005 FINDINGS: Brain: Small focus of abnormal diffusion restriction along the posteromedial left temporal lobe. No  other diffusion abnormality. Diffuse confluent hyperintense T2-weighted signal within the periventricular, deep and juxtacortical white matter, most commonly due to chronic ischemic microangiopathy. There is generalized atrophy without lobar predilection. There are a few chronic microhemorrhages in a predominantly central distribution. Normal midline structures. Vascular: Normal flow voids. Skull and upper cervical spine: Normal marrow signal. Sinuses/Orbits: Negative. Other: None. IMPRESSION: 1.  Small focus of abnormal diffusion restriction along the posteromedial left temporal lobe, consistent with a small acute infarct. 2. Severe chronic ischemic microangiopathy and generalized atrophy. Chronic micro 3. Chronic microhemorrhage within a predominantly central distribution, likely indicating chronic hypertensive angiopathy. Electronically Signed   By: Deatra Robinson M.D.   On: 09/30/2019 23:42   ECHOCARDIOGRAM COMPLETE BUBBLE STUDY  Result Date: 10/01/2019    ECHOCARDIOGRAM REPORT   Patient Name:   Alyssa Adams [facility] Date of Exam: 10/01/2019 Medical Rec #:  [id number]         Height:       62.0 in Accession #:    [id number]        Weight:       153.2 lb Date of Birth:  1933-12-19         BSA:          1.707 m Patient Age:    85 years          BP:           169/72 mmHg Patient Gender: F                 HR:           63 bpm. Exam Location:  Inpatient Procedure: 2D Echo and Saline Contrast Bubble Study Indications:    Stroke  History:        Patient has no prior history of Echocardiogram examinations.                 Risk Factors:Hypertension.  Sonographer:    Thurman Coyer RDCS (AE) Referring Phys: [id number] NA LI IMPRESSIONS  1. Left ventricular ejection fraction, by estimation, is 70 to 75%. The left ventricle has hyperdynamic function. The left ventricle has no regional wall motion abnormalities. Left ventricular diastolic parameters are consistent with Grade I diastolic dysfunction (impaired relaxation).  2. Right ventricular systolic function is normal. The right ventricular size is normal. Tricuspid regurgitation signal is inadequate for assessing PA pressure.  3. The mitral valve is grossly normal. No evidence of mitral valve regurgitation. No evidence of mitral stenosis.  4. The aortic valve is tricuspid. Aortic valve regurgitation is not visualized. No aortic stenosis is present.  5. The inferior vena cava is normal in size with greater than 50% respiratory variability, suggesting right atrial pressure  of 3 mmHg.  6. Agitated saline contrast bubble study was negative, with no evidence of any interatrial shunt. Conclusion(s)/Recommendation(s): No intracardiac source of embolism detected on this transthoracic study. A transesophageal echocardiogram is recommended to exclude cardiac source of embolism if clinically indicated. FINDINGS  Left Ventricle: Left ventricular ejection fraction, by estimation, is 70 to 75%. The left ventricle has hyperdynamic function. The left ventricle has no regional wall motion abnormalities. The left ventricular internal cavity size was normal in size. There is no left ventricular hypertrophy. Left ventricular diastolic parameters are consistent with Grade I diastolic dysfunction (impaired relaxation). Right Ventricle: The right ventricular size is normal. No increase in right ventricular wall thickness. Right ventricular systolic function is normal. Tricuspid regurgitation signal is inadequate for assessing PA pressure. Left Atrium: Left atrial  size was normal in size. Right Atrium: Right atrial size was normal in size. Pericardium: Trivial pericardial effusion is present. Presence of pericardial fat pad. Mitral Valve: The mitral valve is grossly normal. No evidence of mitral valve regurgitation. No evidence of mitral valve stenosis. Tricuspid Valve: The tricuspid valve is grossly normal. Tricuspid valve regurgitation is not demonstrated. No evidence of tricuspid stenosis. Aortic Valve: The aortic valve is tricuspid. Aortic valve regurgitation is not visualized. No aortic stenosis is present. Pulmonic Valve: The pulmonic valve was grossly normal. Pulmonic valve regurgitation is not visualized. No evidence of pulmonic stenosis. Aorta: The aortic root and ascending aorta are structurally normal, with no evidence of dilitation. Venous: The inferior vena cava is normal in size with greater than 50% respiratory variability, suggesting right atrial pressure of 3 mmHg. IAS/Shunts: The atrial  septum is grossly normal. Agitated saline contrast was given intravenously to evaluate for intracardiac shunting. Agitated saline contrast bubble study was negative, with no evidence of any interatrial shunt.  LEFT VENTRICLE PLAX 2D LVIDd:         3.60 cm  Diastology LVIDs:         2.40 cm  LV e' lateral:   6.22 cm/s LV PW:         1.00 cm  LV E/e' lateral: 10.0 LV IVS:        1.00 cm  LV e' medial:    5.33 cm/s LVOT diam:     1.80 cm  LV E/e' medial:  11.7 LV SV:         68 LV SV Index:   40 LVOT Area:     2.54 cm  RIGHT VENTRICLE RV S prime:     16.60 cm/s TAPSE (M-mode): 2.1 cm LEFT ATRIUM             Index       RIGHT ATRIUM           Index LA diam:        2.30 cm 1.35 cm/m  RA Area:     10.10 cm LA Vol (A2C):   46.1 ml 27.01 ml/m RA Volume:   18.20 ml  10.66 ml/m LA Vol (A4C):   44.2 ml 25.89 ml/m LA Biplane Vol: 45.1 ml 26.42 ml/m  AORTIC VALVE LVOT Vmax:   124.00 cm/s LVOT Vmean:  96.900 cm/s LVOT VTI:    0.268 m  AORTA Ao Root diam: 2.60 cm MITRAL VALVE MV Area (PHT): 1.65 cm     SHUNTS MV Decel Time: 461 msec     Systemic VTI:  0.27 m MV E velocity: 62.10 cm/s   Systemic Diam: 1.80 cm MV A velocity: 101.00 cm/s MV E/A ratio:  0.61 Lennie Odor MD Electronically signed by Lennie Odor MD Signature Date/Time: 10/01/2019/3:47:30 PM    Final    CT HEAD CODE STROKE WO CONTRAST  Result Date: 09/30/2019 CLINICAL DATA:  Code stroke. Possible stroke, ataxia, stroke suspected. Additional history provided: Last known well 1300 EXAM: CT HEAD WITHOUT CONTRAST TECHNIQUE: Contiguous axial images were obtained from the base of the skull through the vertex without intravenous contrast. COMPARISON:  Brain MRI 07/19/2005, brain MRI 08/01/2004, head CT 07/31/2004 FINDINGS: Brain: There is no evidence of acute intracranial hemorrhage. Ischemic changes within the left lentiform nucleus, progressed as compared to MRI 07/19/2005, but otherwise age indeterminate. Redemonstrated chronic lacunar infarct within the left  corona radiata. Background advanced chronic small vessel ischemic disease and moderate generalized parenchymal atrophy, also progressed. There is no  evidence of intracranial mass. No midline shift or extra-axial fluid collection. Vascular: No hyperdense vessel.  Atherosclerotic calcifications. Skull: Normal. Negative for fracture or focal lesion. Sinuses/Orbits: Visualized orbits demonstrate no acute abnormality. No significant paranasal sinus disease or mastoid effusion at the imaged levels. ASPECTS ([place] Stroke Program Early CT Score) - Ganglionic level infarction (caudate, lentiform nuclei, internal capsule, insula, M1-M3 cortex): 6 (point subtracted for age-indeterminate ischemic changes within the left lentiform nucleus) - Supraganglionic infarction (M4-M6 cortex): 3 Total score (0-10 with 10 being normal): 9 These results were called by telephone at the time of interpretation on 09/30/2019 at 5:14 pm to provider Dr. Wilford Corner, who verbally acknowledged these results. IMPRESSION: 1. Age-indeterminate ischemic changes within the left lentiform nucleus. Aspects 9. 2. No evidence of acute intracranial hemorrhage or acute demarcated cortical infarct. 3. Advanced chronic small vessel ischemic disease has progressed as compared to prior MRI 07/19/2005. Redemonstrated chronic lacunar infarct within the left corona radiata. 4. Moderate generalized parenchymal atrophy. Electronically Signed   By: Jackey Loge DO   On: 09/30/2019 17:17     Chigozie Basaldua M.D on 10/02/2019 at 12:06 PM  Between 7am to 7pm - Pager - [phone]  After 7pm go to [email or web address] - password [facility]  [facility] -  Office  [phone]

## 2019-10-02 NOTE — Consult Note (Signed)
Physical Medicine and Rehabilitation Consult Reason for Consult: Left side weakness Referring Physician: [facility]   HPI: Alyssa Adams is a 86 y.o. 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  Per chart review patient lives alone.  Multilevel home bed and bath on main level one-step to entry.  Daughter stays at night she has a personal aide during the day for 3 hours.  She used a rolling walker in the house in a wheelchair in the community.  Presented 09/30/2019 with slurred speech altered mental status and left side weakness.  Blood pressure 196/80 on admission.  CT the head showed age-indeterminate ischemic changes within the left lentiform nucleus.  No evidence of acute intracranial hemorrhage.  CT angiogram head and neck with no large vessel occlusion.  MRI showed small focus of abnormal diffusion restriction along the posterior medial left temporal lobe consistent with small acute infarct.  Patient did not receive TPA.  Echocardiogram with ejection fraction of 67% grade 1 diastolic dysfunction.  Admission chemistries unremarkable, urinalysis positive nitrite.  Neurology follow-up currently on aspirin and Plavix for CVA prophylaxis subcutaneous heparin for DVT prophylaxis.  Currently n.p.o. awaiting speech therapy follow-up swallow study.  Therapy evaluations completed with recommendations of physical medicine rehab consult.  Review of Systems  Constitutional: Negative for chills and fever.  HENT: Negative for hearing loss.   Eyes: Negative for blurred vision and double vision.  Respiratory: Negative for cough and shortness of breath.   Cardiovascular: Negative for palpitations and leg swelling.  Gastrointestinal: Positive for diarrhea. Negative for constipation, heartburn and nausea.       GERD  Genitourinary: Negative for dysuria and flank pain.  Musculoskeletal: Positive for myalgias.  Skin: Negative for rash.    Neurological: Positive for weakness.  Psychiatric/Behavioral: Positive for depression. The patient does not have insomnia.   All other systems reviewed and are negative.  Past Medical History:  Diagnosis Date  . Depression   . GERD (gastroesophageal reflux disease)   . Hypertension   . Stroke (cerebrum) [facility])    Past Surgical History:  Procedure Laterality Date  . ABDOMINAL HYSTERECTOMY    . GALLBLADDER SURGERY     Family History  Problem Relation Age of Onset  . Kidney cancer Father   . Prostate cancer Neg Hx    Social History:  reports that she has never smoked. She has never used smokeless tobacco. She reports that she does not drink alcohol. No history on file for drug. Allergies:  Allergies  Allergen Reactions  . Erythromycin Ethylsuccinate Other (See Comments)    Reaction not recalled by the patient  . Sulfa Antibiotics Other (See Comments)    Reaction not recalled by the patient   Medications Prior to Admission  Medication Sig Dispense Refill  . cholestyramine light (PREVALITE) 4 g packet Take 4 g by mouth daily before breakfast.     . Vitamin D, Ergocalciferol, (DRISDOL) 1.25 MG (50000 UNIT) CAPS capsule Take 50,000 Units by mouth every 7 (seven) days.    . benzonatate (TESSALON PERLES) 100 MG capsule Take 1 capsule (100 mg total) by mouth 3 (three) times daily as needed. (Patient not taking: Reported on 09/30/2019) 15 capsule 0  . doxycycline (VIBRAMYCIN) 100 MG capsule Take 1 capsule (100 mg total) by mouth 2 (two) times daily. (Patient not taking: Reported on 09/30/2019) 20 capsule 0  . mirabegron ER (MYRBETRIQ) 50 MG TB24 tablet Take 1 tablet (50 mg total) by mouth  daily. (Patient not taking: Reported on 09/30/2019) 30 tablet 11    Home: Home Living Family/patient expects to be discharged to:: Private residence Living Arrangements: Alone Available Help at Discharge: Available PRN/intermittently, Family, Personal care attendant(daughter stays at night and personal  aide during day for 3 hr) Type of Home: House Home Access: Stairs to enter Secretary/administrator of Steps: 1 Entrance Stairs-Rails: None Home Layout: Multi-level, Able to live on main level with bedroom/bathroom Bathroom Shower/Tub: Engineer, manufacturing systems: Standard Home Equipment: Cane - single point, Environmental consultant - 4 wheels, Bedside commode, Wheelchair - manual, Grab bars - toilet, Grab bars - tub/shower  Functional History: Prior Function Level of Independence: Needs assistance Gait / Transfers Assistance Needed: Uses w/c in community and RW in house (reports pushes rollator with 1 hand ADL's / Homemaking Assistance Needed: Reports just does sponge baths because fearful of getting in shower; independent with ADLs; has assistance with cleaning and cooking; can fix easy meals on her own Functional Status:  Mobility: Bed Mobility Overal bed mobility: Needs Assistance Bed Mobility: Sit to Supine Supine to sit: Mod assist Sit to supine: Mod assist, +2 for physical assistance General bed mobility comments: sit<> supine required mod A x 2 for trunk and legs (some limitations due to high stretcher and 2 people to lay down to prevent sliding) Transfers Overall transfer level: Needs assistance Equipment used: Rolling walker (2 wheeled), 1 person hand held assist Transfers: Sit to/from Stand, Stand Pivot Transfers Sit to Stand: Min assist, From elevated surface Stand pivot transfers: Mod assist, +2 physical assistance General transfer comment:  For pivot back to bed to the right required mod A x 2.  Pt fearful of falling, required increased cues for technique/safety, and with shaking/trembling in LEs Ambulation/Gait Ambulation/Gait assistance: Mod assist, +2 physical assistance Gait Distance (Feet): 2 Feet Assistive device: 1 person hand held assist Gait Pattern/deviations: Step-to pattern, Shuffle, Decreased stride length General Gait Details: steps back to bed during stand pivot (see  above) Gait velocity: decreased    ADL: ADL Overall ADL's : Needs assistance/impaired Eating/Feeding: Minimal assistance, Bed level Grooming: Wash/dry face, Minimal assistance, Sitting Grooming Details (indicate cue type and reason): wiping mouth after vomitting Upper Body Dressing : Maximal assistance Lower Body Dressing: Maximal assistance Toilet Transfer: +2 for physical assistance, Moderate assistance Functional mobility during ADLs: +2 for physical assistance, Maximal assistance General ADL Comments: pt fearful of falling and holding onto RW. pt reports using Rollator with one hand at baseline. pt with poor demo of use of RW this session. question progression with use of DME and needs to increase to platform for R UE positioning  Cognition: Cognition Overall Cognitive Status: No family/caregiver present to determine baseline cognitive functioning Orientation Level: Oriented to person, Oriented to situation, Oriented to place, Disoriented to time Cognition Arousal/Alertness: Awake/alert Behavior During Therapy: [facility] for tasks assessed/performed Overall Cognitive Status: No family/caregiver present to determine baseline cognitive functioning Area of Impairment: Orientation Orientation Level: Disoriented to, Time(states year 2020 but knew it was April) General Comments: decrease awareness of deficits   Blood pressure (!) 173/70, pulse 71, temperature 98.6 F (37 C), temperature source Oral, resp. rate 16, height  (1.575 m), weight 66 kg, SpO2 96 %. General: Alert and oriented x 3, No apparent distress HEENT: Head is normocephalic, atraumatic, PERRLA, EOMI, sclera anicteric, oral mucosa pink and moist, dentition intact, ext ear canals clear,  Neck: Supple without JVD or lymphadenopathy Heart: Reg rate and rhythm. No murmurs rubs or gallops Chest: CTA  bilaterally without wheezes, rales, or rhonchi; no distress Abdomen: Soft, non-tender, non-distended, bowel sounds  positive. Extremities: No clubbing, cyanosis, or edema. Pulses are 2+ Skin: Clean and intact without signs of breakdown Neuro/Musculoskeletal:  Patient is alert.  No acute distress.  She does make eye contact with examiner and follows simple commands.  Provides her name and age.  Fair medical historian.  Mild right sided facial droop.   RUE: 4/5 SA, EF, 2/5 EE, 3/5 hand grip RLE: 4/5 throughout LUE and LLE: 5.5 throughout MAS 2 and right ankle and 1+ at right knee Psych: Pt's affect is appropriate. Pt is cooperative  Results for orders placed or performed during the hospital encounter of 09/30/19 (from the past 24 hour(s))  Urinalysis, Routine w reflex microscopic     Status: Abnormal   Collection Time: 10/01/19  4:15 PM  Result Value Ref Range   Color, Urine YELLOW YELLOW   APPearance CLEAR CLEAR   Specific Gravity, Urine 1.025 1.005 - 1.030   pH 6.0 5.0 - 8.0   Glucose, UA NEGATIVE NEGATIVE mg/dL   Hgb urine dipstick TRACE (A) NEGATIVE   Bilirubin Urine NEGATIVE NEGATIVE   Ketones, ur NEGATIVE NEGATIVE mg/dL   Protein, ur NEGATIVE NEGATIVE mg/dL   Nitrite POSITIVE (A) NEGATIVE   Leukocytes,Ua NEGATIVE NEGATIVE  Urinalysis, Microscopic (reflex)     Status: Abnormal   Collection Time: 10/01/19  4:15 PM  Result Value Ref Range   RBC / HPF 0-5 0 - 5 RBC/hpf   WBC, UA 0-5 0 - 5 WBC/hpf   Bacteria, UA FEW (A) NONE SEEN   Squamous Epithelial / LPF 0-5 0 - 5  CBG monitoring, ED     Status: Abnormal   Collection Time: 10/01/19  8:00 PM  Result Value Ref Range   Glucose-Capillary 109 (H) 70 - 99 mg/dL  CBG monitoring, ED     Status: Abnormal   Collection Time: 10/02/19 12:57 AM  Result Value Ref Range   Glucose-Capillary 105 (H) 70 - 99 mg/dL   CT Code Stroke CTA Head W/WO contrast  Result Date: 09/30/2019 CLINICAL DATA:  Altered mental status.  Weakness. EXAM: CT ANGIOGRAPHY HEAD AND NECK TECHNIQUE: Multidetector CT imaging of the head and neck was performed using the standard  protocol during bolus administration of intravenous contrast. Multiplanar CT image reconstructions and MIPs were obtained to evaluate the vascular anatomy. Carotid stenosis measurements (when applicable) are obtained utilizing NASCET criteria, using the distal internal carotid diameter as the denominator. CONTRAST:  60mL OMNIPAQUE IOHEXOL 350 MG/ML SOLN COMPARISON:  None. FINDINGS: CTA NECK FINDINGS Aortic arch: Standard 3 vessel aortic arch with minimal atherosclerotic plaque. Widely patent arch vessel origins. Right carotid system: Patent with a moderate amount of predominantly calcified plaque at the carotid bifurcation. No evidence of significant stenosis or dissection. Tortuous proximal common carotid artery. Left carotid system: Patent with extensive calcified plaque at the carotid bifurcation resulting in less than 50% narrowing of the ICA origin. Tortuous proximal common carotid artery. Vertebral arteries: Patent and codominant without evidence of stenosis or dissection. Skeleton: Moderate diffuse cervical disc degeneration. Other neck: No evidence of cervical lymphadenopathy or mass. Upper chest: Clear lung apices. Review of the MIP images confirms the above findings CTA HEAD FINDINGS Anterior circulation: The internal carotid arteries are patent from skull base to carotid termini. Calcified plaque in the cavernous and proximal supraclinoid segments bilaterally does not result in significant stenosis. There is a punctate calcification at the left ICA terminus, favored to be chronic  as it was likely faintly visible on a 2006 head CT. This likely results in moderate left MCA origin stenosis and potentially severe left ACA origin stenosis. The left A1 segment is hypoplastic, and the left ACA is primarily supplied from the right via the anterior communicating artery. The right A1 segment is widely patent. Both MCAs are patent without evidence of proximal branch occlusion. There are mild left M1 and proximal  right M2 stenoses. No aneurysm is identified. Posterior circulation: The intracranial vertebral arteries are patent to the basilar with calcified plaque resulting in mild proximal right V4 stenosis. Patent PICAs and SCAs are seen bilaterally. The basilar artery is patent and small in caliber congenitally. There are large posterior communicating arteries with hypoplastic right and absent left posterior communicating arteries. The PCAs are patent with branch vessel irregularity but no significant proximal stenosis. No aneurysm is identified. Venous sinuses: Poorly evaluated due to contrast timing. Anatomic variants: Predominantly fetal origin of the PCAs. Review of the MIP images confirms the above findings IMPRESSION: 1. No large vessel occlusion. 2. Suspected chronic calcification at the left ICA terminus resulting in ACA and MCA origin stenoses. 3. Mild distal right vertebral artery stenosis. 4. Left greater than right cervical carotid artery atherosclerosis without significant stenosis. These results were communicated to Dr. Wilford Corner at 5:20 pm on 09/30/2019 by text page via the [facility] messaging system. Electronically Signed   By: Sebastian Ache M.D.   On: 09/30/2019 17:34   DG Chest 1 View  Result Date: 09/30/2019 CLINICAL DATA:  Slurred speech EXAM: CHEST  1 VIEW COMPARISON:  06/07/2012 FINDINGS: The heart size and mediastinal contours are within normal limits. Both lungs are clear. The visualized skeletal structures are unremarkable. IMPRESSION: No active disease. Electronically Signed   By: Jasmine Pang M.D.   On: 09/30/2019 22:51   CT Code Stroke CTA Neck W/WO contrast  Result Date: 09/30/2019 CLINICAL DATA:  Altered mental status.  Weakness. EXAM: CT ANGIOGRAPHY HEAD AND NECK TECHNIQUE: Multidetector CT imaging of the head and neck was performed using the standard protocol during bolus administration of intravenous contrast. Multiplanar CT image reconstructions and MIPs were obtained to evaluate the  vascular anatomy. Carotid stenosis measurements (when applicable) are obtained utilizing NASCET criteria, using the distal internal carotid diameter as the denominator. CONTRAST:  56mL OMNIPAQUE IOHEXOL 350 MG/ML SOLN COMPARISON:  None. FINDINGS: CTA NECK FINDINGS Aortic arch: Standard 3 vessel aortic arch with minimal atherosclerotic plaque. Widely patent arch vessel origins. Right carotid system: Patent with a moderate amount of predominantly calcified plaque at the carotid bifurcation. No evidence of significant stenosis or dissection. Tortuous proximal common carotid artery. Left carotid system: Patent with extensive calcified plaque at the carotid bifurcation resulting in less than 50% narrowing of the ICA origin. Tortuous proximal common carotid artery. Vertebral arteries: Patent and codominant without evidence of stenosis or dissection. Skeleton: Moderate diffuse cervical disc degeneration. Other neck: No evidence of cervical lymphadenopathy or mass. Upper chest: Clear lung apices. Review of the MIP images confirms the above findings CTA HEAD FINDINGS Anterior circulation: The internal carotid arteries are patent from skull base to carotid termini. Calcified plaque in the cavernous and proximal supraclinoid segments bilaterally does not result in significant stenosis. There is a punctate calcification at the left ICA terminus, favored to be chronic as it was likely faintly visible on a 2006 head CT. This likely results in moderate left MCA origin stenosis and potentially severe left ACA origin stenosis. The left A1 segment is hypoplastic, and  the left ACA is primarily supplied from the right via the anterior communicating artery. The right A1 segment is widely patent. Both MCAs are patent without evidence of proximal branch occlusion. There are mild left M1 and proximal right M2 stenoses. No aneurysm is identified. Posterior circulation: The intracranial vertebral arteries are patent to the basilar with  calcified plaque resulting in mild proximal right V4 stenosis. Patent PICAs and SCAs are seen bilaterally. The basilar artery is patent and small in caliber congenitally. There are large posterior communicating arteries with hypoplastic right and absent left posterior communicating arteries. The PCAs are patent with branch vessel irregularity but no significant proximal stenosis. No aneurysm is identified. Venous sinuses: Poorly evaluated due to contrast timing. Anatomic variants: Predominantly fetal origin of the PCAs. Review of the MIP images confirms the above findings IMPRESSION: 1. No large vessel occlusion. 2. Suspected chronic calcification at the left ICA terminus resulting in ACA and MCA origin stenoses. 3. Mild distal right vertebral artery stenosis. 4. Left greater than right cervical carotid artery atherosclerosis without significant stenosis. These results were communicated to Dr. Wilford Corner at 5:20 pm on 09/30/2019 by text page via the [facility] messaging system. Electronically Signed   By: Sebastian Ache M.D.   On: 09/30/2019 17:34   MR BRAIN WO CONTRAST  Result Date: 09/30/2019 CLINICAL DATA:  Possible stroke. EXAM: MRI HEAD WITHOUT CONTRAST TECHNIQUE: Multiplanar, multiecho pulse sequences of the brain and surrounding structures were obtained without intravenous contrast. COMPARISON:  Brain MRI 07/19/2005 FINDINGS: Brain: Small focus of abnormal diffusion restriction along the posteromedial left temporal lobe. No other diffusion abnormality. Diffuse confluent hyperintense T2-weighted signal within the periventricular, deep and juxtacortical white matter, most commonly due to chronic ischemic microangiopathy. There is generalized atrophy without lobar predilection. There are a few chronic microhemorrhages in a predominantly central distribution. Normal midline structures. Vascular: Normal flow voids. Skull and upper cervical spine: Normal marrow signal. Sinuses/Orbits: Negative. Other: None. IMPRESSION: 1.  Small focus of abnormal diffusion restriction along the posteromedial left temporal lobe, consistent with a small acute infarct. 2. Severe chronic ischemic microangiopathy and generalized atrophy. Chronic micro 3. Chronic microhemorrhage within a predominantly central distribution, likely indicating chronic hypertensive angiopathy. Electronically Signed   By: Deatra Robinson M.D.   On: 09/30/2019 23:42   ECHOCARDIOGRAM COMPLETE BUBBLE STUDY  Result Date: 10/01/2019    ECHOCARDIOGRAM REPORT   Patient Name:   DAMARY DOLAND [facility] Date of Exam: 10/01/2019 Medical Rec #:  [id number]         Height:       62.0 in Accession #:    [id number]        Weight:       153.2 lb Date of Birth:  04-Dec-1933         BSA:          1.707 m Patient Age:    85 years          BP:           169/72 mmHg Patient Gender: F                 HR:           63 bpm. Exam Location:  Inpatient Procedure: 2D Echo and Saline Contrast Bubble Study Indications:    Stroke  History:        Patient has no prior history of Echocardiogram examinations.                 Risk Factors:Hypertension.  Sonographer:    Thurman Coyer RDCS (AE) Referring Phys: [id number] NA LI IMPRESSIONS  1. Left ventricular ejection fraction, by estimation, is 70 to 75%. The left ventricle has hyperdynamic function. The left ventricle has no regional wall motion abnormalities. Left ventricular diastolic parameters are consistent with Grade I diastolic dysfunction (impaired relaxation).  2. Right ventricular systolic function is normal. The right ventricular size is normal. Tricuspid regurgitation signal is inadequate for assessing PA pressure.  3. The mitral valve is grossly normal. No evidence of mitral valve regurgitation. No evidence of mitral stenosis.  4. The aortic valve is tricuspid. Aortic valve regurgitation is not visualized. No aortic stenosis is present.  5. The inferior vena cava is normal in size with greater than 50% respiratory variability, suggesting right atrial pressure  of 3 mmHg.  6. Agitated saline contrast bubble study was negative, with no evidence of any interatrial shunt. Conclusion(s)/Recommendation(s): No intracardiac source of embolism detected on this transthoracic study. A transesophageal echocardiogram is recommended to exclude cardiac source of embolism if clinically indicated. FINDINGS  Left Ventricle: Left ventricular ejection fraction, by estimation, is 70 to 75%. The left ventricle has hyperdynamic function. The left ventricle has no regional wall motion abnormalities. The left ventricular internal cavity size was normal in size. There is no left ventricular hypertrophy. Left ventricular diastolic parameters are consistent with Grade I diastolic dysfunction (impaired relaxation). Right Ventricle: The right ventricular size is normal. No increase in right ventricular wall thickness. Right ventricular systolic function is normal. Tricuspid regurgitation signal is inadequate for assessing PA pressure. Left Atrium: Left atrial size was normal in size. Right Atrium: Right atrial size was normal in size. Pericardium: Trivial pericardial effusion is present. Presence of pericardial fat pad. Mitral Valve: The mitral valve is grossly normal. No evidence of mitral valve regurgitation. No evidence of mitral valve stenosis. Tricuspid Valve: The tricuspid valve is grossly normal. Tricuspid valve regurgitation is not demonstrated. No evidence of tricuspid stenosis. Aortic Valve: The aortic valve is tricuspid. Aortic valve regurgitation is not visualized. No aortic stenosis is present. Pulmonic Valve: The pulmonic valve was grossly normal. Pulmonic valve regurgitation is not visualized. No evidence of pulmonic stenosis. Aorta: The aortic root and ascending aorta are structurally normal, with no evidence of dilitation. Venous: The inferior vena cava is normal in size with greater than 50% respiratory variability, suggesting right atrial pressure of 3 mmHg. IAS/Shunts: The atrial  septum is grossly normal. Agitated saline contrast was given intravenously to evaluate for intracardiac shunting. Agitated saline contrast bubble study was negative, with no evidence of any interatrial shunt.  LEFT VENTRICLE PLAX 2D LVIDd:         3.60 cm  Diastology LVIDs:         2.40 cm  LV e' lateral:   6.22 cm/s LV PW:         1.00 cm  LV E/e' lateral: 10.0 LV IVS:        1.00 cm  LV e' medial:    5.33 cm/s LVOT diam:     1.80 cm  LV E/e' medial:  11.7 LV SV:         68 LV SV Index:   40 LVOT Area:     2.54 cm  RIGHT VENTRICLE RV S prime:     16.60 cm/s TAPSE (M-mode): 2.1 cm LEFT ATRIUM             Index       RIGHT ATRIUM  Index LA diam:        2.30 cm 1.35 cm/m  RA Area:     10.10 cm LA Vol (A2C):   46.1 ml 27.01 ml/m RA Volume:   18.20 ml  10.66 ml/m LA Vol (A4C):   44.2 ml 25.89 ml/m LA Biplane Vol: 45.1 ml 26.42 ml/m  AORTIC VALVE LVOT Vmax:   124.00 cm/s LVOT Vmean:  96.900 cm/s LVOT VTI:    0.268 m  AORTA Ao Root diam: 2.60 cm MITRAL VALVE MV Area (PHT): 1.65 cm     SHUNTS MV Decel Time: 461 msec     Systemic VTI:  0.27 m MV E velocity: 62.10 cm/s   Systemic Diam: 1.80 cm MV A velocity: 101.00 cm/s MV E/A ratio:  0.61 Lennie Odor MD Electronically signed by Lennie Odor MD Signature Date/Time: 10/01/2019/3:47:30 PM    Final    CT HEAD CODE STROKE WO CONTRAST  Result Date: 09/30/2019 CLINICAL DATA:  Code stroke. Possible stroke, ataxia, stroke suspected. Additional history provided: Last known well 1300 EXAM: CT HEAD WITHOUT CONTRAST TECHNIQUE: Contiguous axial images were obtained from the base of the skull through the vertex without intravenous contrast. COMPARISON:  Brain MRI 07/19/2005, brain MRI 08/01/2004, head CT 07/31/2004 FINDINGS: Brain: There is no evidence of acute intracranial hemorrhage. Ischemic changes within the left lentiform nucleus, progressed as compared to MRI 07/19/2005, but otherwise age indeterminate. Redemonstrated chronic lacunar infarct within the left  corona radiata. Background advanced chronic small vessel ischemic disease and moderate generalized parenchymal atrophy, also progressed. There is no evidence of intracranial mass. No midline shift or extra-axial fluid collection. Vascular: No hyperdense vessel.  Atherosclerotic calcifications. Skull: Normal. Negative for fracture or focal lesion. Sinuses/Orbits: Visualized orbits demonstrate no acute abnormality. No significant paranasal sinus disease or mastoid effusion at the imaged levels. ASPECTS ([place] Stroke Program Early CT Score) - Ganglionic level infarction (caudate, lentiform nuclei, internal capsule, insula, M1-M3 cortex): 6 (point subtracted for age-indeterminate ischemic changes within the left lentiform nucleus) - Supraganglionic infarction (M4-M6 cortex): 3 Total score (0-10 with 10 being normal): 9 These results were called by telephone at the time of interpretation on 09/30/2019 at 5:14 pm to provider Dr. Wilford Corner, who verbally acknowledged these results. IMPRESSION: 1. Age-indeterminate ischemic changes within the left lentiform nucleus. Aspects 9. 2. No evidence of acute intracranial hemorrhage or acute demarcated cortical infarct. 3. Advanced chronic small vessel ischemic disease has progressed as compared to prior MRI 07/19/2005. Redemonstrated chronic lacunar infarct within the left corona radiata. 4. Moderate generalized parenchymal atrophy. Electronically Signed   By: Jackey Loge DO   On: 09/30/2019 17:17     Assessment/Plan: Diagnosis: small L PCA infarct secondary to small vessel disease 1. Does the need for close, 24 hr/day medical supervision in concert with the patient's rehab needs make it unreasonable for this patient to be served in a less intensive setting? Yes 2. Co-Morbidities requiring supervision/potential complications: prior stroke with residual right sided weakness, HTN, AMS, L ICA terminus calcification with ACA and MCA origin stenoses, posteromedial left temporal lobe  infarct, overweight (BMI 26.61), hypertensive emergency, dysphagia, advanced age, GERD, depression  3. Due to bladder management, bowel management, safety, skin/wound care, disease management, medication administration, pain management and patient education, does the patient require 24 hr/day rehab nursing? Yes 4. Does the patient require coordinated care of a physician, rehab nurse, therapy disciplines of PT, OT, SLP to address physical and functional deficits in the context of the above medical diagnosis(es)? Yes Addressing deficits  in the following areas: balance, endurance, locomotion, strength, transferring, bowel/bladder control, bathing, dressing, feeding, grooming, toileting, cognition, swallowing and psychosocial support 5. Can the patient actively participate in an intensive therapy program of at least 3 hrs of therapy per day at least 5 days per week? Yes 6. The potential for patient to make measurable gains while on inpatient rehab is excellent 7. Anticipated functional outcomes upon discharge from inpatient rehab are min assist  with PT, min assist with OT, modified independent with SLP. 8. Estimated rehab length of stay to reach the above functional goals is: 12-16 days 9. Anticipated discharge destination: Home 10. Overall Rehab/Functional Prognosis: excellent  RECOMMENDATIONS: This patient's condition is appropriate for continued rehabilitative care in the following setting: CIR Patient has agreed to participate in recommended program. Yes Note that insurance prior authorization may be required for reimbursement for recommended care.  Comment: Mrs. Dante GangWhitesell is an excellent CIR candidate. May benefit from low dose Baclofen at night for her spasticity (5mg  HS). Thank you for this consult. We will continue to follow in Mrs. Arbaugh's care.  Mcarthur RossettiDaniel J Angiulli, PA-C 10/02/2019   I have personally performed a face to face diagnostic evaluation, including, but not limited to relevant  history and physical exam findings, of this patient and developed relevant assessment and plan.  Additionally, I have reviewed and concur with the physician assistant's documentation above.  Sula SodaKrutika Avyukt Cimo, MD

## 2019-10-02 NOTE — Evaluation (Signed)
Clinical/Bedside Swallow Evaluation Patient Details  Name: Alyssa Adams MRN: [id number] Date of Birth: 05-Jan-1934  Today's Date: 10/02/2019 Time: SLP Start Time (ACUTE ONLY): 0910 SLP Stop Time (ACUTE ONLY): 0925 SLP Time Calculation (min) (ACUTE ONLY): 15 min  Past Medical History:  Past Medical History:  Diagnosis Date  . Depression   . GERD (gastroesophageal reflux disease)   . Hypertension   . Stroke (cerebrum) [facility])    Past Surgical History:  Past Surgical History:  Procedure Laterality Date  . ABDOMINAL HYSTERECTOMY    . GALLBLADDER SURGERY     HPI:  Pt is 86 y.o. female with medical history of stroke with residual right-sided weakness, HTN, GERD, depression who presented with slurred speech and altered mental status. MRI revealed small abnormal diffusion restriction along the posteromedial L temporal lobe consistent with small acute infarct. CXR no active disease.   Assessment / Plan / Recommendation Clinical Impression  Pt reports frequent coughing with solids/liquids since first stroke (15 years ago per pt). No history found of frequent pneumonias. Objective information cannot be otained at bedside to determine swallow status (chronic versus actue or pharyngeal versus esophageal). Delayed throat clearing, cough x 1 after cracker. No outward cough following thin but eyes watery throughout (anecdotal sign) and slightly dysynchronous respirations. Formal assessment with MBS recommended which cannot be completed until tomorrow. Recommend initiate Dys 2 texture, nectar thick liquids, pills whole in puree if small.      SLP Visit Diagnosis: Dysphagia, unspecified (R13.10)    Aspiration Risk  Mild aspiration risk    Diet Recommendation Dysphagia 2 (Fine chop);Nectar-thick liquid(sips water only)   Liquid Administration via: Cup;No straw Medication Administration: Crushed with puree Supervision: Full supervision/cueing for compensatory strategies;Staff to assist with self  feeding Compensations: Slow rate;Small sips/bites Postural Changes: Seated upright at 90 degrees    Other  Recommendations Oral Care Recommendations: Oral care BID Other Recommendations: Order thickener from pharmacy   Follow up Recommendations Other (comment)(TBD)      Frequency and Duration min 2x/week  2 weeks       Prognosis Prognosis for Safe Diet Advancement: (fair-good)      Swallow Study   General HPI: Pt is 86 y.o. female with medical history of stroke with residual right-sided weakness, HTN, GERD, depression who presented with slurred speech and altered mental status. MRI revealed small abnormal diffusion restriction along the posteromedial L temporal lobe consistent with small acute infarct. CXR no active disease. Type of Study: Bedside Swallow Evaluation Previous Swallow Assessment: (NONE) Diet Prior to this Study: NPO Temperature Spikes Noted: No Respiratory Status: Room air History of Recent Intubation: No Behavior/Cognition: Alert;Cooperative;Pleasant mood Oral Cavity Assessment: Within Functional Limits Oral Care Completed by SLP: No Vision: Functional for self-feeding Self-Feeding Abilities: Needs assist Patient Positioning: Upright in bed Baseline Vocal Quality: Normal Volitional Cough: Strong Volitional Swallow: Able to elicit    Oral/Motor/Sensory Function Overall Oral Motor/Sensory Function: Mild impairment Facial ROM: Reduced right;Suspected CN VII (facial) dysfunction Facial Symmetry: Abnormal symmetry right;Suspected CN VII (facial) dysfunction Facial Strength: Reduced right;Suspected CN VII (facial) dysfunction Facial Sensation: (Pt denies decreased sensation) Lingual ROM: Within Functional Limits Lingual Symmetry: Within Functional Limits Lingual Strength: Within Functional Limits   Ice Chips Ice chips: Not tested   Thin Liquid Thin Liquid: Impaired Presentation: Cup;Straw Pharyngeal  Phase Impairments: Throat Clearing - Delayed(?  dysynchronous breathing )    Nectar Thick Nectar Thick Liquid: Not tested   Honey Thick Honey Thick Liquid: Not tested   Puree Puree: Impaired  Pharyngeal Phase Impairments: Throat Clearing - Delayed   Solid     Solid: Within functional limits      Alyssa Adams 10/02/2019,11:06 AM  Alyssa Adams.Ed Risk analyst [phone] Office [phone]

## 2019-10-02 NOTE — Progress Notes (Signed)
Physical Therapy Treatment Patient Details Name: Alyssa Adams MRN: [id number] DOB: [birth year] Today's Date: 10/02/2019    History of Present Illness Pt is 86 y.o. female with medical history significant of stroke with residual right-sided weakness, HTN, GERD, depression who presented with slurred speech and altered mental status.  Pt admitted with HTN and CVA.  MRI revealed small abnormal diffusion restriction along the posteromedial L temporal lobe consistent with small acute infarct.    PT Comments    Pt progressing well with OOB mobility this day, seen as co-treat to maximize OOB mobility. Pt ambulated room distance with use of RW, as pt uses rollator with single UE at home. PT and OT discussed use of platform walker, pt seemed uninterested. PT spoke with pt about d/c plan extensively at end of session, and pt reports she does not walk to stay at a SNF long-term. PT discussed SNF as stepping stone to getting home post-acutely, and pt seemed more open to idea of ST-SNF after this discussion. PT updated plan to reflect this, PT to continue to follow acutely.    Follow Up Recommendations  SNF;Supervision/Assistance - 24 hour     Equipment Recommendations  None recommended by PT;Other (comment)(has DME)    Recommendations for Other Services       Precautions / Restrictions Precautions Precautions: Fall Restrictions Weight Bearing Restrictions: No    Mobility  Bed Mobility Overal bed mobility: Needs Assistance Bed Mobility: Supine to Sit     Supine to sit: Supervision     General bed mobility comments: HOB up, increased time   Transfers Overall transfer level: Needs assistance Equipment used: 1 person hand held assist Transfers: Sit to/from Stand Sit to Stand: Min assist         General transfer comment: min assist for power up with HHA +1, pt unsteady and required +2 assist once standing. Pt requesting use of RW, as pt uses rollator at home with single UE  use.  Ambulation/Gait Ambulation/Gait assistance: Min assist Gait Distance (Feet): 20 Feet Assistive device: Rolling walker (2 wheeled) Gait Pattern/deviations: Step-through pattern;Decreased stride length;Decreased stance time - right;Decreased step length - right;Decreased dorsiflexion - right Gait velocity: decr   General Gait Details: Min assist to steady, guide RW on R side. Verbal cuing for placement in RW, use of RW.   Stairs             Wheelchair Mobility    Modified Rankin (Stroke Patients Only) Modified Rankin (Stroke Patients Only) Pre-Morbid Rankin Score: Moderate disability Modified Rankin: Moderately severe disability     Balance Overall balance assessment: Needs assistance   Sitting balance-Leahy Scale: Good Sitting balance - Comments: no LOB with donning socks     Standing balance-Leahy Scale: Fair Standing balance comment: min guard statically, stood x5 minutes statically at sink                            Cognition Arousal/Alertness: Awake/alert Behavior During Therapy: WFL for tasks assessed/performed Overall Cognitive Status: Impaired/Different from baseline Area of Impairment: Safety/judgement                         Safety/Judgement: Decreased awareness of deficits     General Comments: pt denies fall risk, but agreeable to rehab in SNF by end of session      Exercises      General Comments        Pertinent Vitals/Pain  Pain Assessment: No/denies pain    Home Living                      Prior Function            PT Goals (current goals can now be found in the care plan section) Acute Rehab PT Goals Patient Stated Goal: pt is agreeable to SNF for ST rehab PT Goal Formulation: With patient Time For Goal Achievement: 10/15/19 Potential to Achieve Goals: Good Progress towards PT goals: Progressing toward goals    Frequency    Min 4X/week      PT Plan Discharge plan needs to be updated     Co-evaluation PT/OT/SLP Co-Evaluation/Treatment: Yes Reason for Co-Treatment: For patient/therapist safety PT goals addressed during session: Mobility/safety with mobility;Proper use of DME        AM-PAC PT "6 Clicks" Mobility   Outcome Measure  Help needed turning from your back to your side while in a flat bed without using bedrails?: A Little Help needed moving from lying on your back to sitting on the side of a flat bed without using bedrails?: A Little Help needed moving to and from a bed to a chair (including a wheelchair)?: A Little Help needed standing up from a chair using your arms (e.g., wheelchair or bedside chair)?: A Little Help needed to walk in hospital room?: A Little Help needed climbing 3-5 steps with a railing? : A Lot 6 Click Score: 17    End of Session Equipment Utilized During Treatment: Gait belt Activity Tolerance: Patient tolerated treatment well Patient left: in bed;with call bell/phone within reach Nurse Communication: Mobility status PT Visit Diagnosis: Unsteadiness on feet (R26.81);Other abnormalities of gait and mobility (R26.89);Muscle weakness (generalized) (M62.81)     Time: 2010-0712 PT Time Calculation (min) (ACUTE ONLY): 38 min  Charges:  $Gait Training: 8-22 mins                     Alyssa Adams E, PT [place] Pager [phone]  Office [phone]   Alyssa Adams D Alyssa Adams

## 2019-10-02 NOTE — Progress Notes (Signed)
Occupational Therapy Treatment Patient Details Name: Alyssa Adams MRN: [id number] DOB: 04/19/1934 Today's Date: 10/02/2019    History of present illness Pt is 86 y.o. female with medical history significant of stroke with residual right-sided weakness, HTN, GERD, depression who presented with slurred speech and altered mental status.  Pt admitted with HTN and CVA.  MRI revealed small abnormal diffusion restriction along the posteromedial L temporal lobe consistent with small acute infarct.   OT comments  Pt with improved mobility and ability to participate in dressing and standing grooming vs evaluation, but continues to be a high fall risk and be generally weak. Pt is now agreeable to ST rehab in SNF prior to going home. Provided palm protector for R hand. Reminded pt of swallow precaution to avoid straw use.  Follow Up Recommendations  SNF;Supervision/Assistance - 24 hour    Equipment Recommendations  Other (comment)(defer to next venue)    Recommendations for Other Services      Precautions / Restrictions Precautions Precautions: Fall Restrictions Weight Bearing Restrictions: No       Mobility Bed Mobility Overal bed mobility: Needs Assistance Bed Mobility: Supine to Sit     Supine to sit: Supervision     General bed mobility comments: HOB up, increased time   Transfers Overall transfer level: Needs assistance Equipment used: 1 person hand held assist Transfers: Sit to/from Stand Sit to Stand: Min guard         General transfer comment: started with one person hand held assist, then 2 person hand held assist before pt made it clear that she uses a rollator with one hand at home    Balance Overall balance assessment: Needs assistance   Sitting balance-Leahy Scale: Good Sitting balance - Comments: no LOB with donning socks     Standing balance-Leahy Scale: Fair Standing balance comment: min guard statically                           ADL  either performed or assessed with clinical judgement   ADL Overall ADL's : Needs assistance/impaired     Grooming: Brushing hair;Standing;Minimal assistance           Upper Body Dressing : Minimal assistance;Sitting Upper Body Dressing Details (indicate cue type and reason): front opening gown Lower Body Dressing: Set up;Sitting/lateral leans Lower Body Dressing Details (indicate cue type and reason): donned her own socks Toilet Transfer: Min guard;Ambulation;RW   Toileting- Clothing Manipulation and Hygiene: Total assistance;Sit to/from stand Toileting - Clothing Manipulation Details (indicate cue type and reason): pt with baseline urinary incontinence     Functional mobility during ADLs: Rolling walker;Minimal assistance(pt uses L hand to guide walker) General ADL Comments: pt typically uses a rollator with one arm to mobilize, has not had success with canes     Vision       Perception     Praxis      Cognition Arousal/Alertness: Awake/alert Behavior During Therapy: WFL for tasks assessed/performed Overall Cognitive Status: Impaired/Different from baseline Area of Impairment: Safety/judgement                         Safety/Judgement: Decreased awareness of deficits     General Comments: pt denies fall risk, but agreeable to rehab in SNF by end of session        Exercises     Shoulder Instructions       General Comments  Pertinent Vitals/ Pain       Pain Assessment: No/denies pain  Home Living                                          Prior Functioning/Environment              Frequency  Min 2X/week        Progress Toward Goals  OT Goals(current goals can now be found in the care plan section)  Progress towards OT goals: Progressing toward goals  Acute Rehab OT Goals Patient Stated Goal: pt is agreeable to SNF for ST rehab OT Goal Formulation: With patient Time For Goal Achievement: 10/16/19 Potential  to Achieve Goals: Good  Plan Discharge plan remains appropriate    Co-evaluation                 AM-PAC OT "6 Clicks" Daily Activity     Outcome Measure   Help from another person eating meals?: A Little Help from another person taking care of personal grooming?: A Little Help from another person toileting, which includes using toliet, bedpan, or urinal?: A Lot Help from another person bathing (including washing, rinsing, drying)?: A Little Help from another person to put on and taking off regular upper body clothing?: A Little Help from another person to put on and taking off regular lower body clothing?: A Little 6 Click Score: 17    End of Session Equipment Utilized During Treatment: Gait belt;Rolling walker  OT Visit Diagnosis: Unsteadiness on feet (R26.81);Muscle weakness (generalized) (M62.81);Hemiplegia and hemiparesis Hemiplegia - Right/Left: Right Hemiplegia - dominant/non-dominant: Dominant Hemiplegia - caused by: Cerebral infarction   Activity Tolerance Patient tolerated treatment well   Patient Left in chair;with call bell/phone within reach;with chair alarm set   Nurse Communication Mobility status        Time: 1500-1536 OT Time Calculation (min): 36 min  Charges: OT General Charges $OT Visit: 1 Visit OT Treatments $Self Care/Home Management : 8-22 mins  Nestor Lewandowsky, OTR/L Acute Rehabilitation Services Pager: [phone] Office: [phone]   Malka So 10/02/2019, 4:01 PM

## 2019-10-03 ENCOUNTER — Inpatient Hospital Stay (HOSPITAL_COMMUNITY): Payer: PPO

## 2019-10-03 LAB — GLUCOSE, CAPILLARY: Glucose-Capillary: 108 mg/dL — ABNORMAL HIGH (ref 70–99)

## 2019-10-03 MED ORDER — AMLODIPINE BESYLATE 5 MG PO TABS
5.0000 mg | ORAL_TABLET | Freq: Every day | ORAL | Status: DC
  Administered 2019-10-03 – 2019-10-04 (×2): 5 mg via ORAL
  Filled 2019-10-03 (×2): qty 1

## 2019-10-03 NOTE — TOC Initial Note (Signed)
Transition of Care [facility]) - Initial/Assessment Note    Patient Details  Name: Alyssa Adams MRN: [id number] Date of Birth: 1933/06/19  [facility] CM/SW Contact:    Truddie Hidden, LCSW Phone Number: 10/03/2019, 2:42 PM  Clinical Narrative:                  Admitted with medical history significant of stroke with residual right-sided weakness, HTN, GERD, depression who presented with slurred speech and altered mental status.   PT initially recommending CIR. However, patient and family feel that the time spent in CIR may not be sufficient. Patient will likely require a longer stay to increase mobility and allow time for patient to regain strength to return home with minimal assistance.   Patient is reluctantly agreeable to a short term stay at SNF. She has stated several times that she does not want to stay for a long period of time.   Information will be sent out for review. Patient and family will be presented with bed offers once they are received.   TOC will continue to follow for dispo planning.   Expected Discharge Plan: Skilled Nursing Facility Barriers to Discharge: Continued Medical Work up, No SNF bed, Insurance Authorization   Patient Goals and CMS Choice Patient states their goals for this hospitalization and ongoing recovery are:: get stronger to return home CMS Medicare.gov Compare Post Acute Care list provided to:: Patient Choice offered to / list presented to : Patient  Expected Discharge Plan and Services Expected Discharge Plan: Skilled Nursing Facility In-house Referral: Clinical Social Work   Post Acute Care Choice: Skilled Nursing Facility Living arrangements for the past 2 months: Single Family Home                                      Prior Living Arrangements/Services Living arrangements for the past 2 months: Single Family Home Lives with:: Self                   Activities of Daily Living Home Assistive  Devices/Equipment: Cane (specify quad or straight) ADL Screening (condition at time of admission) Patient's cognitive ability adequate to safely complete daily activities?: Yes Is the patient deaf or have difficulty hearing?: No Does the patient have difficulty seeing, even when wearing glasses/contacts?: No Does the patient have difficulty concentrating, remembering, or making decisions?: No Patient able to express need for assistance with ADLs?: Yes Does the patient have difficulty dressing or bathing?: Yes Independently performs ADLs?: No Communication: Independent Dressing (OT): Needs assistance Is this a change from baseline?: Pre-admission baseline Grooming: Needs assistance Is this a change from baseline?: Pre-admission baseline Feeding: Needs assistance Is this a change from baseline?: Pre-admission baseline Bathing: Needs assistance Is this a change from baseline?: Pre-admission baseline Toileting: Needs assistance Is this a change from baseline?: Pre-admission baseline In/Out Bed: Needs assistance Is this a change from baseline?: Pre-admission baseline Walks in Home: Needs assistance Is this a change from baseline?: Pre-admission baseline Does the patient have difficulty walking or climbing stairs?: Yes Weakness of Legs: Right Weakness of Arms/Hands: Right  Permission Sought/Granted                  Emotional Assessment              Admission diagnosis:  Slurred speech [R47.81] Hypertensive emergency without congestive heart failure [I16.1] Hypertensive emergency [I16.1] Cerebrovascular accident (  CVA) due to thrombosis of left posterior cerebral artery [facility]) [I63.332] Patient Active Problem List   Diagnosis Date Noted  . Cerebrovascular accident (CVA) due to thrombosis of left posterior cerebral artery ([place])   . Hypertensive emergency 09/30/2019  . History of stroke in adulthood 09/30/2019  . Essential hypertension 09/30/2019  . GERD (gastroesophageal  reflux disease) 09/30/2019  . Depression 09/30/2019   PCP:  Maryland Pink, MD Pharmacy:   [facility] [address] [place] [address] Phone: [phone] Fax: [phone]  [place], [place] [address] [place] [address] Phone: [phone] Fax: [phone]     Social Determinants of Health (SDOH) Interventions    Readmission Risk Interventions No flowsheet data found.

## 2019-10-03 NOTE — Progress Notes (Addendum)
Modified Barium Swallow Progress Note  Patient Details  Name: Alyssa Adams MRN: [id number] Date of Birth: 21-Apr-1934  Today's Date: 10/03/2019  Modified Barium Swallow completed.  Full report located under Chart Review in the Imaging Section.  Brief recommendations include the following:  Clinical Impression  Pt exhibited mild pharyngeal dysphagia suspected to be mostly chronic possibly exacerbated by acute stroke. She reports intermittent coughing with liquids "for years". There was min-mild vallecular and pyriform sinus residue possibly due to inefficient pharyngeal contraction (?cervical rigidity- cervical calcificaiton observed). Thin barium residue from lateral channels and pyriform sinuses spilled into her laryngeal vestibule after the swallow and fell below the vocal cords eliciting reflexive cough however did not fully clear as barium seen in trachea. Supraglottic breath hold strategy was not effective. Chin tuck would thought to be counterintuitive given pyriform sinues residue however it increased coordination and protection 90% of the time. Esophageal scan did not reveal overt abnormalities (MBS does not diagnose below the level of the UES). Recommend regular texture, thin liquids (chin tuck with liquids only), meds whole in puree, throat clear during meals.        Swallow Evaluation Recommendations       SLP Diet Recommendations: Regular solids;Thin liquid   Liquid Administration via: Cup;No straw   Medication Administration: Whole meds with puree   Supervision: Patient able to self feed(needs cueing until chin tuck becomes habitual)   Compensations: Slow rate;Small sips/bites;Chin tuck;Other (Comment)(chin tuck with liquids), swallow x 2   Postural Changes: Seated upright at 90 degrees;Remain semi-upright after after feeds/meals (Comment)   Oral Care Recommendations: Oral care BID        Royce Macadamia 10/03/2019,1:46 PM   Breck Coons [place].Ed  Nurse, children's [phone] Office [phone]

## 2019-10-03 NOTE — Progress Notes (Signed)
PROGRESS NOTE                                                                                                                                                                                                             Patient Demographics:    Alyssa Adams, is a 86 y.o. female, DOB - [birth year], ZOX:[id number]  Admit date - 09/30/2019   Admitting Physician Starleen Arms, MD  Outpatient Primary MD for the patient is Jerl Mina, MD  LOS - 2   Chief Complaint  Patient presents with  . Code Stroke       Brief Narrative   86 y.o. female with medical history significant of stroke with residual right-sided weakness, HTN, GERD, depression who presented with slurred speech and altered mental status.  Patient has some short-term memory deficits, patient with known history of CVA in the past, with known residual right-sided weakness , was significant for small acute infarct in the posterior medial left temporal lobe.   Subjective:    [name] today denies any chest pain, shortness of breath, no nausea or vomiting, she denies any new focal deficits.   Assessment  & Plan :    Active Problems:   Hypertensive emergency   History of stroke in adulthood   Essential hypertension   GERD (gastroesophageal reflux disease)   Depression   Cerebrovascular accident (CVA) due to thrombosis of left posterior cerebral artery (HCC)   Acute CVA -Patient presents with slurred speech, and altered mental status, this appears to be improving, her MRI significant for acute CVA in left temporal region. -Head and neck with no evidence of large vessel occlusion, but with chronic calcification in the left ICA and ACA and MCA origin stenosis, as well as mild distal right vertebral artery stenosis, neurology input greatly appreciated, current recommendation is for dual antiplatelet therapy aspirin and Plavix for 3 weeks, then aspirin alone. -2D echo with EF 70 to  75%. -PT/OT, initial recommendation for CIR versus SNF, after CIR evaluation and family discussion, plan for SNF placement, TOC is following. -SLP consulted, she is on dysphagia nectar thick liquids, plan for swallow evaluation this morning.  COVID-19 Labs  No results for input(s): DDIMER, FERRITIN, LDH, CRP in the last 72 hours.  Lab Results  Component Value Date   SARSCOV2NAA NEGATIVE 09/30/2019   Essential hypertension/hypertensive urgency -  Blood pressure significantly elevated on presentation 196/80, given now confirmed acute CVA, initially on permissive hypertension, now blood pressure started to increase, will start on low-dose Norvasc 5 mg oral daily to normalize blood pressure in 2 to 3 days .  History of CVA -With spastic right-sided hemiparesis,  started on baclofen per rehab recommendation.  GERD -Continue with PPI  Hyperlipidemia -LDL is 111, now on Lipitor.    Code Status : Full  Family communication: Discussed with daughter via phone 4/23   Disposition Plan  :  Status is: Inpatient  Remains inpatient appropriate because:Dysphagea   Dispo: The patient is from: Home              Anticipated d/c is to: SNF              Anticipated d/c date is: When her diet is appropriately advanced, and SNF bed available.              Patient currently is not medically stable to d/c.  She remains on dysphagia 2 nectar thick diet  Consults  :  Neuro, rehab  Procedures  : None  DVT Prophylaxis  :  Celina heparin  Lab Results  Component Value Date   PLT 216 10/02/2019    Antibiotics  :    Anti-infectives (From admission, onward)   None        Objective:   Vitals:   10/02/19 1507 10/02/19 2034 10/03/19 0100 10/03/19 0742  BP: (!) 176/77 (!) 168/75  (!) 154/66  Pulse: 76 62  (!) 57  Resp:      Temp: 98 F (36.7 C) 98.7 F (37.1 C)  97.7 F (36.5 C)  TempSrc:  Oral  Oral  SpO2: 97% 97% 98% 96%  Weight:      Height:        Wt Readings from Last 3  Encounters:  10/02/19 66 kg  04/26/16 68 kg  03/17/16 69.1 kg     Intake/Output Summary (Last 24 hours) at 10/03/2019 1017 Last data filed at 10/02/2019 1500 Gross per 24 hour  Intake 0 ml  Output --  Net 0 ml     Physical Exam  Awake Alert, pleasant RRR,No Gallops,Rubs or new Murmurs, No Parasternal Heave +ve B.Sounds, Abd Soft, No tenderness, No rebound - guarding or rigidity. No Cyanosis, Clubbing or edema, No new Rash or bruise   Patient with chronic right-sided spastic hemiparesis    Data Review:    CBC Recent Labs  Lab 09/30/19 1653 09/30/19 1707 10/01/19 0347 10/02/19 0730  WBC 7.6  --  9.1 6.8  HGB 13.6 13.6 12.9 12.9  HCT 41.8 40.0 39.3 39.2  PLT 253  --  208 216  MCV 97.4  --  97.0 96.3  MCH 31.7  --  31.9 31.7  MCHC 32.5  --  32.8 32.9  RDW 13.4  --  13.2 13.2  LYMPHSABS 2.2  --   --   --   MONOABS 0.5  --   --   --   EOSABS 0.3  --   --   --   BASOSABS 0.0  --   --   --     Chemistries  Recent Labs  Lab 09/30/19 1653 09/30/19 1707 10/01/19 0347 10/02/19 0730  NA 141 140 142 142  K 4.8 5.4* 3.3* 3.3*  CL 108 105 106 107  CO2 24  --  23 26  GLUCOSE 113* 108* 116* 107*  BUN 14 21 11  13  CREATININE 0.84 0.80 0.89 0.89  CALCIUM 9.0  --  9.1 9.0  AST 35  --  19  --   ALT 15  --  20  --   ALKPHOS 69  --  78  --   BILITOT 1.7*  --  0.9  --    ------------------------------------------------------------------------------------------------------------------ Recent Labs    10/01/19 0347  CHOL 200  HDL 55  LDLCALC 111*  TRIG 170*  CHOLHDL 3.6    Lab Results  Component Value Date   HGBA1C 5.6 10/01/2019   ------------------------------------------------------------------------------------------------------------------ No results for input(s): TSH, T4TOTAL, T3FREE, THYROIDAB in the last 72 hours.  Invalid input(s):  FREET3 ------------------------------------------------------------------------------------------------------------------ Recent Labs    10/01/19 0347  VITAMINB12 131*    Coagulation profile No results for input(s): INR, PROTIME in the last 168 hours.  No results for input(s): DDIMER in the last 72 hours.  Cardiac Enzymes No results for input(s): CKMB, TROPONINI, MYOGLOBIN in the last 168 hours.  Invalid input(s): CK ------------------------------------------------------------------------------------------------------------------ No results found for: BNP  Inpatient Medications  Scheduled Meds: . amLODipine  5 mg Oral Daily  . aspirin EC  81 mg Oral Daily  . atorvastatin  40 mg Oral Daily  . baclofen  5 mg Oral QHS  . cholestyramine light  4 g Oral QAC breakfast  . clopidogrel  75 mg Oral Daily  . heparin injection (subcutaneous)  5,000 Units Subcutaneous Q8H  . mirabegron ER  50 mg Oral Daily  . pantoprazole  40 mg Oral Daily  . sodium chloride flush  3 mL Intravenous Once   Continuous Infusions:  PRN Meds:.acetaminophen **OR** acetaminophen (TYLENOL) oral liquid 160 mg/5 mL **OR** acetaminophen, ondansetron (ZOFRAN) IV, Resource ThickenUp Clear, senna-docusate  Micro Results Recent Results (from the past 240 hour(s))  Respiratory Panel by RT PCR (Flu A&B, Covid) - Nasopharyngeal Swab     Status: None   Collection Time: 09/30/19  5:32 PM   Specimen: Nasopharyngeal Swab  Result Value Ref Range Status   SARS Coronavirus 2 by RT PCR NEGATIVE NEGATIVE Final    Comment: (NOTE) SARS-CoV-2 target nucleic acids are NOT DETECTED. The SARS-CoV-2 RNA is generally detectable in upper respiratoy specimens during the acute phase of infection. The lowest concentration of SARS-CoV-2 viral copies this assay can detect is 131 copies/mL. A negative result does not preclude SARS-Cov-2 infection and should not be used as the sole basis for treatment or other patient management  decisions. A negative result may occur with  improper specimen collection/handling, submission of specimen other than nasopharyngeal swab, presence of viral mutation(s) within the areas targeted by this assay, and inadequate number of viral copies (<131 copies/mL). A negative result must be combined with clinical observations, patient history, and epidemiological information. The expected result is Negative. Fact Sheet for Patients:  PinkCheek.be Fact Sheet for Healthcare Providers:  GravelBags.it This test is not yet ap proved or cleared by the [place] FDA and  has been authorized for detection and/or diagnosis of SARS-CoV-2 by FDA under an Emergency Use Authorization (EUA). This EUA will remain  in effect (meaning this test can be used) for the duration of the COVID-19 declaration under Section 564(b)(1) of the Act, 21 U.S.C. section 360bbb-3(b)(1), unless the authorization is terminated or revoked sooner.    Influenza A by PCR NEGATIVE NEGATIVE Final   Influenza B by PCR NEGATIVE NEGATIVE Final    Comment: (NOTE) The Xpert Xpress SARS-CoV-2/FLU/RSV assay is intended as an aid in  the diagnosis of influenza from Nasopharyngeal swab  specimens and  should not be used as a sole basis for treatment. Nasal washings and  aspirates are unacceptable for Xpert Xpress SARS-CoV-2/FLU/RSV  testing. Fact Sheet for Patients: https://www.moore.com/ Fact Sheet for Healthcare Providers: https://www.young.biz/ This test is not yet approved or cleared by the [place] FDA and  has been authorized for detection and/or diagnosis of SARS-CoV-2 by  FDA under an Emergency Use Authorization (EUA). This EUA will remain  in effect (meaning this test can be used) for the duration of the  Covid-19 declaration under Section 564(b)(1) of the Act, 21  U.S.C. section 360bbb-3(b)(1), unless the authorization  is  terminated or revoked. Performed at [facility], [address] [place], [place] [address]     Radiology Reports CT Code Stroke CTA Head W/WO contrast  Result Date: 09/30/2019 CLINICAL DATA:  Altered mental status.  Weakness. EXAM: CT ANGIOGRAPHY HEAD AND NECK TECHNIQUE: Multidetector CT imaging of the head and neck was performed using the standard protocol during bolus administration of intravenous contrast. Multiplanar CT image reconstructions and MIPs were obtained to evaluate the vascular anatomy. Carotid stenosis measurements (when applicable) are obtained utilizing NASCET criteria, using the distal internal carotid diameter as the denominator. CONTRAST:  60mL OMNIPAQUE IOHEXOL 350 MG/ML SOLN COMPARISON:  None. FINDINGS: CTA NECK FINDINGS Aortic arch: Standard 3 vessel aortic arch with minimal atherosclerotic plaque. Widely patent arch vessel origins. Right carotid system: Patent with a moderate amount of predominantly calcified plaque at the carotid bifurcation. No evidence of significant stenosis or dissection. Tortuous proximal common carotid artery. Left carotid system: Patent with extensive calcified plaque at the carotid bifurcation resulting in less than 50% narrowing of the ICA origin. Tortuous proximal common carotid artery. Vertebral arteries: Patent and codominant without evidence of stenosis or dissection. Skeleton: Moderate diffuse cervical disc degeneration. Other neck: No evidence of cervical lymphadenopathy or mass. Upper chest: Clear lung apices. Review of the MIP images confirms the above findings CTA HEAD FINDINGS Anterior circulation: The internal carotid arteries are patent from skull base to carotid termini. Calcified plaque in the cavernous and proximal supraclinoid segments bilaterally does not result in significant stenosis. There is a punctate calcification at the left ICA terminus, favored to be chronic as it was likely faintly visible on a 2006 head CT. This  likely results in moderate left MCA origin stenosis and potentially severe left ACA origin stenosis. The left A1 segment is hypoplastic, and the left ACA is primarily supplied from the right via the anterior communicating artery. The right A1 segment is widely patent. Both MCAs are patent without evidence of proximal branch occlusion. There are mild left M1 and proximal right M2 stenoses. No aneurysm is identified. Posterior circulation: The intracranial vertebral arteries are patent to the basilar with calcified plaque resulting in mild proximal right V4 stenosis. Patent PICAs and SCAs are seen bilaterally. The basilar artery is patent and small in caliber congenitally. There are large posterior communicating arteries with hypoplastic right and absent left posterior communicating arteries. The PCAs are patent with branch vessel irregularity but no significant proximal stenosis. No aneurysm is identified. Venous sinuses: Poorly evaluated due to contrast timing. Anatomic variants: Predominantly fetal origin of the PCAs. Review of the MIP images confirms the above findings IMPRESSION: 1. No large vessel occlusion. 2. Suspected chronic calcification at the left ICA terminus resulting in ACA and MCA origin stenoses. 3. Mild distal right vertebral artery stenosis. 4. Left greater than right cervical carotid artery atherosclerosis without significant stenosis. These results were  communicated to Dr. Wilford Corner at 5:20 pm on 09/30/2019 by text page via the [facility] messaging system. Electronically Signed   By: Sebastian Ache M.D.   On: 09/30/2019 17:34   DG Chest 1 View  Result Date: 09/30/2019 CLINICAL DATA:  Slurred speech EXAM: CHEST  1 VIEW COMPARISON:  06/07/2012 FINDINGS: The heart size and mediastinal contours are within normal limits. Both lungs are clear. The visualized skeletal structures are unremarkable. IMPRESSION: No active disease. Electronically Signed   By: Jasmine Pang M.D.   On: 09/30/2019 22:51   CT Code  Stroke CTA Neck W/WO contrast  Result Date: 09/30/2019 CLINICAL DATA:  Altered mental status.  Weakness. EXAM: CT ANGIOGRAPHY HEAD AND NECK TECHNIQUE: Multidetector CT imaging of the head and neck was performed using the standard protocol during bolus administration of intravenous contrast. Multiplanar CT image reconstructions and MIPs were obtained to evaluate the vascular anatomy. Carotid stenosis measurements (when applicable) are obtained utilizing NASCET criteria, using the distal internal carotid diameter as the denominator. CONTRAST:  60mL OMNIPAQUE IOHEXOL 350 MG/ML SOLN COMPARISON:  None. FINDINGS: CTA NECK FINDINGS Aortic arch: Standard 3 vessel aortic arch with minimal atherosclerotic plaque. Widely patent arch vessel origins. Right carotid system: Patent with a moderate amount of predominantly calcified plaque at the carotid bifurcation. No evidence of significant stenosis or dissection. Tortuous proximal common carotid artery. Left carotid system: Patent with extensive calcified plaque at the carotid bifurcation resulting in less than 50% narrowing of the ICA origin. Tortuous proximal common carotid artery. Vertebral arteries: Patent and codominant without evidence of stenosis or dissection. Skeleton: Moderate diffuse cervical disc degeneration. Other neck: No evidence of cervical lymphadenopathy or mass. Upper chest: Clear lung apices. Review of the MIP images confirms the above findings CTA HEAD FINDINGS Anterior circulation: The internal carotid arteries are patent from skull base to carotid termini. Calcified plaque in the cavernous and proximal supraclinoid segments bilaterally does not result in significant stenosis. There is a punctate calcification at the left ICA terminus, favored to be chronic as it was likely faintly visible on a 2006 head CT. This likely results in moderate left MCA origin stenosis and potentially severe left ACA origin stenosis. The left A1 segment is hypoplastic, and the  left ACA is primarily supplied from the right via the anterior communicating artery. The right A1 segment is widely patent. Both MCAs are patent without evidence of proximal branch occlusion. There are mild left M1 and proximal right M2 stenoses. No aneurysm is identified. Posterior circulation: The intracranial vertebral arteries are patent to the basilar with calcified plaque resulting in mild proximal right V4 stenosis. Patent PICAs and SCAs are seen bilaterally. The basilar artery is patent and small in caliber congenitally. There are large posterior communicating arteries with hypoplastic right and absent left posterior communicating arteries. The PCAs are patent with branch vessel irregularity but no significant proximal stenosis. No aneurysm is identified. Venous sinuses: Poorly evaluated due to contrast timing. Anatomic variants: Predominantly fetal origin of the PCAs. Review of the MIP images confirms the above findings IMPRESSION: 1. No large vessel occlusion. 2. Suspected chronic calcification at the left ICA terminus resulting in ACA and MCA origin stenoses. 3. Mild distal right vertebral artery stenosis. 4. Left greater than right cervical carotid artery atherosclerosis without significant stenosis. These results were communicated to Dr. Wilford Corner at 5:20 pm on 09/30/2019 by text page via the [facility] messaging system. Electronically Signed   By: Sebastian Ache M.D.   On: 09/30/2019 17:34   MR  BRAIN WO CONTRAST  Result Date: 09/30/2019 CLINICAL DATA:  Possible stroke. EXAM: MRI HEAD WITHOUT CONTRAST TECHNIQUE: Multiplanar, multiecho pulse sequences of the brain and surrounding structures were obtained without intravenous contrast. COMPARISON:  Brain MRI 07/19/2005 FINDINGS: Brain: Small focus of abnormal diffusion restriction along the posteromedial left temporal lobe. No other diffusion abnormality. Diffuse confluent hyperintense T2-weighted signal within the periventricular, deep and juxtacortical white  matter, most commonly due to chronic ischemic microangiopathy. There is generalized atrophy without lobar predilection. There are a few chronic microhemorrhages in a predominantly central distribution. Normal midline structures. Vascular: Normal flow voids. Skull and upper cervical spine: Normal marrow signal. Sinuses/Orbits: Negative. Other: None. IMPRESSION: 1. Small focus of abnormal diffusion restriction along the posteromedial left temporal lobe, consistent with a small acute infarct. 2. Severe chronic ischemic microangiopathy and generalized atrophy. Chronic micro 3. Chronic microhemorrhage within a predominantly central distribution, likely indicating chronic hypertensive angiopathy. Electronically Signed   By: Deatra RobinsonKevin  Herman M.D.   On: 09/30/2019 23:42   ECHOCARDIOGRAM COMPLETE BUBBLE STUDY  Result Date: 10/01/2019    ECHOCARDIOGRAM REPORT   [facility]   [facility] Date of Exam: 10/01/2019 Medical Rec #:  [id number]         Height:       62.0 in Accession #:    [id number]        Weight:       153.2 lb Date of Birth:  04/05/1934         BSA:          1.707 m Patient Age:    85 years          BP:           169/72 mmHg Patient Gender: F                 HR:           63 bpm. Exam Location:  Inpatient Procedure: 2D Echo and Saline Contrast Bubble Study Indications:    Stroke  History:        Patient has no prior history of Echocardiogram examinations.                 Risk Factors:Hypertension.  Sonographer:    Thurman Coyerasey Kirkpatrick RDCS (AE) Referring Phys: [id number] NA LI IMPRESSIONS  1. Left ventricular ejection fraction, by estimation, is 70 to 75%. The left ventricle has hyperdynamic function. The left ventricle has no regional wall motion abnormalities. Left ventricular diastolic parameters are consistent with Grade I diastolic dysfunction (impaired relaxation).  2. Right ventricular systolic function is normal. The right ventricular size is normal. Tricuspid regurgitation signal is inadequate for  assessing PA pressure.  3. The mitral valve is grossly normal. No evidence of mitral valve regurgitation. No evidence of mitral stenosis.  4. The aortic valve is tricuspid. Aortic valve regurgitation is not visualized. No aortic stenosis is present.  5. The inferior vena cava is normal in size with greater than 50% respiratory variability, suggesting right atrial pressure of 3 mmHg.  6. Agitated saline contrast bubble study was negative, with no evidence of any interatrial shunt. Conclusion(s)/Recommendation(s): No intracardiac source of embolism detected on this transthoracic study. A transesophageal echocardiogram is recommended to exclude cardiac source of embolism if clinically indicated. FINDINGS  Left Ventricle: Left ventricular ejection fraction, by estimation, is 70 to 75%. The left ventricle has hyperdynamic function. The left ventricle has no regional wall motion abnormalities. The left ventricular internal cavity size was normal in size.  There is no left ventricular hypertrophy. Left ventricular diastolic parameters are consistent with Grade I diastolic dysfunction (impaired relaxation). Right Ventricle: The right ventricular size is normal. No increase in right ventricular wall thickness. Right ventricular systolic function is normal. Tricuspid regurgitation signal is inadequate for assessing PA pressure. Left Atrium: Left atrial size was normal in size. Right Atrium: Right atrial size was normal in size. Pericardium: Trivial pericardial effusion is present. Presence of pericardial fat pad. Mitral Valve: The mitral valve is grossly normal. No evidence of mitral valve regurgitation. No evidence of mitral valve stenosis. Tricuspid Valve: The tricuspid valve is grossly normal. Tricuspid valve regurgitation is not demonstrated. No evidence of tricuspid stenosis. Aortic Valve: The aortic valve is tricuspid. Aortic valve regurgitation is not visualized. No aortic stenosis is present. Pulmonic Valve: The  pulmonic valve was grossly normal. Pulmonic valve regurgitation is not visualized. No evidence of pulmonic stenosis. Aorta: The aortic root and ascending aorta are structurally normal, with no evidence of dilitation. Venous: The inferior vena cava is normal in size with greater than 50% respiratory variability, suggesting right atrial pressure of 3 mmHg. IAS/Shunts: The atrial septum is grossly normal. Agitated saline contrast was given intravenously to evaluate for intracardiac shunting. Agitated saline contrast bubble study was negative, with no evidence of any interatrial shunt.  LEFT VENTRICLE PLAX 2D LVIDd:         3.60 cm  Diastology LVIDs:         2.40 cm  LV e' lateral:   6.22 cm/s LV PW:         1.00 cm  LV E/e' lateral: 10.0 LV IVS:        1.00 cm  LV e' medial:    5.33 cm/s LVOT diam:     1.80 cm  LV E/e' medial:  11.7 LV SV:         68 LV SV Index:   40 LVOT Area:     2.54 cm  RIGHT VENTRICLE RV S prime:     16.60 cm/s TAPSE (M-mode): 2.1 cm LEFT ATRIUM             Index       RIGHT ATRIUM           Index LA diam:        2.30 cm 1.35 cm/m  RA Area:     10.10 cm LA Vol (A2C):   46.1 ml 27.01 ml/m RA Volume:   18.20 ml  10.66 ml/m LA Vol (A4C):   44.2 ml 25.89 ml/m LA Biplane Vol: 45.1 ml 26.42 ml/m  AORTIC VALVE LVOT Vmax:   124.00 cm/s LVOT Vmean:  96.900 cm/s LVOT VTI:    0.268 m  AORTA Ao Root diam: 2.60 cm MITRAL VALVE MV Area (PHT): 1.65 cm     SHUNTS MV Decel Time: 461 msec     Systemic VTI:  0.27 m MV E velocity: 62.10 cm/s   Systemic Diam: 1.80 cm MV A velocity: 101.00 cm/s MV E/A ratio:  0.61 Lennie Odor MD Electronically signed by Lennie Odor MD Signature Date/Time: 10/01/2019/3:47:30 PM    Final    CT HEAD CODE STROKE WO CONTRAST  Result Date: 09/30/2019 CLINICAL DATA:  Code stroke. Possible stroke, ataxia, stroke suspected. Additional history provided: Last known well 1300 EXAM: CT HEAD WITHOUT CONTRAST TECHNIQUE: Contiguous axial images were obtained from the base of the skull  through the vertex without intravenous contrast. COMPARISON:  Brain MRI 07/19/2005, brain MRI 08/01/2004, head CT 07/31/2004  FINDINGS: Brain: There is no evidence of acute intracranial hemorrhage. Ischemic changes within the left lentiform nucleus, progressed as compared to MRI 07/19/2005, but otherwise age indeterminate. Redemonstrated chronic lacunar infarct within the left corona radiata. Background advanced chronic small vessel ischemic disease and moderate generalized parenchymal atrophy, also progressed. There is no evidence of intracranial mass. No midline shift or extra-axial fluid collection. Vascular: No hyperdense vessel.  Atherosclerotic calcifications. Skull: Normal. Negative for fracture or focal lesion. Sinuses/Orbits: Visualized orbits demonstrate no acute abnormality. No significant paranasal sinus disease or mastoid effusion at the imaged levels. ASPECTS ([place] Stroke Program Early CT Score) - Ganglionic level infarction (caudate, lentiform nuclei, internal capsule, insula, M1-M3 cortex): 6 (point subtracted for age-indeterminate ischemic changes within the left lentiform nucleus) - Supraganglionic infarction (M4-M6 cortex): 3 Total score (0-10 with 10 being normal): 9 These results were called by telephone at the time of interpretation on 09/30/2019 at 5:14 pm to provider Dr. Wilford Corner, who verbally acknowledged these results. IMPRESSION: 1. Age-indeterminate ischemic changes within the left lentiform nucleus. Aspects 9. 2. No evidence of acute intracranial hemorrhage or acute demarcated cortical infarct. 3. Advanced chronic small vessel ischemic disease has progressed as compared to prior MRI 07/19/2005. Redemonstrated chronic lacunar infarct within the left corona radiata. 4. Moderate generalized parenchymal atrophy. Electronically Signed   By: Jackey Loge DO   On: 09/30/2019 17:17     Mliss Fritz Ashtian Villacis M.D on 10/03/2019 at 10:17 AM  Between 7am to 7pm - Pager - [phone]  After 7pm go to  [email or web address] - password [facility]  [facility] -  Office  [phone]

## 2019-10-03 NOTE — NC FL2 (Signed)
[place] LEVEL OF CARE SCREENING TOOL     IDENTIFICATION  Patient Name: Alyssa Adams Birthdate: December 12, 1933 Sex: female Admission Date (Current Location): 09/30/2019  [facility] and [place] Number:  Herbalist and Address:  [facility], [place] [address], [place], [place] [address]      Provider Number: [id number]  Attending Physician Name and Address:  Elgergawy, Silver Huguenin, MD  Relative Name and Phone Number:       Current Level of Care: Hospital Recommended Level of Care: [place] Prior Approval Number:    Date Approved/Denied:   PASRR Number: [id number]  Discharge Plan: SNF    Current Diagnoses: Patient Active Problem List   Diagnosis Date Noted  . Cerebrovascular accident (CVA) due to thrombosis of left posterior cerebral artery ([place])   . Hypertensive emergency 09/30/2019  . History of stroke in adulthood 09/30/2019  . Essential hypertension 09/30/2019  . GERD (gastroesophageal reflux disease) 09/30/2019  . Depression 09/30/2019    Orientation RESPIRATION BLADDER Height & Weight     Self, Time, Situation, Place  Normal Continent, External catheter Weight: 145 lb 8.1 oz (66 kg) Height:  5\' 2"  (157.5 cm)  BEHAVIORAL SYMPTOMS/MOOD NEUROLOGICAL BOWEL NUTRITION STATUS      Continent Diet(see discharge summary)  AMBULATORY STATUS COMMUNICATION OF NEEDS Skin   Extensive Assist Verbally Normal                       Personal Care Assistance Level of Assistance  Bathing, Feeding, Dressing Bathing Assistance: Maximum assistance Feeding assistance: Limited assistance Dressing Assistance: Maximum assistance     Functional Limitations Info  Hearing   Hearing Info: Impaired      SPECIAL CARE FACTORS FREQUENCY  PT (By licensed PT), OT (By licensed OT)     PT Frequency: 5 times a week OT Frequency: 5 times a week            Contractures      Additional Factors Info  Code Status,  Allergies Code Status Info: DNR Allergies Info: sulfa antibiotics; Erythromycin Ethylsuccinate           Current Medications (10/03/2019):  This is the current hospital active medication list Current Facility-Administered Medications  Medication Dose Route Frequency Provider Last Rate Last Admin  . acetaminophen (TYLENOL) tablet 650 mg  650 mg Oral Q4H PRN Nicoletta Dress, Na, MD       Or  . acetaminophen (TYLENOL) 160 MG/5ML solution 650 mg  650 mg Per Tube Q4H PRN Nicoletta Dress, Na, MD       Or  . acetaminophen (TYLENOL) suppository 650 mg  650 mg Rectal Q4H PRN Nicoletta Dress, Na, MD      . amLODipine (NORVASC) tablet 5 mg  5 mg Oral Daily Elgergawy, Silver Huguenin, MD   5 mg at 10/03/19 0947  . aspirin EC tablet 81 mg  81 mg Oral Daily Nicoletta Dress, Na, MD   81 mg at 10/03/19 0948  . atorvastatin (LIPITOR) tablet 40 mg  40 mg Oral Daily Rosalin Hawking, MD   40 mg at 10/03/19 0947  . baclofen (LIORESAL) tablet 5 mg  5 mg Oral QHS Elgergawy, Silver Huguenin, MD   5 mg at 10/02/19 2203  . cholestyramine light (PREVALITE) packet 4 g  4 g Oral QAC breakfast Elgergawy, Silver Huguenin, MD   4 g at 10/03/19 0541  . clopidogrel (PLAVIX) tablet 75 mg  75 mg Oral Daily Rosalin Hawking, MD  75 mg at 10/03/19 0947  . heparin injection 5,000 Units  5,000 Units Subcutaneous Q8H Elgergawy, Leana Roe, MD   5,000 Units at 10/03/19 0541  . mirabegron ER (MYRBETRIQ) tablet 50 mg  50 mg Oral Daily Dierdre Searles, Na, MD   50 mg at 10/03/19 0946  . ondansetron (ZOFRAN) injection 4 mg  4 mg Intravenous Q6H PRN Dierdre Searles, Na, MD      . pantoprazole (PROTONIX) EC tablet 40 mg  40 mg Oral Daily Elgergawy, Leana Roe, MD   40 mg at 10/03/19 0947  . Resource ThickenUp Clear   Oral PRN Elgergawy, Leana Roe, MD      . senna-docusate (Senokot-S) tablet 1 tablet  1 tablet Oral QHS PRN Dierdre Searles, Na, MD      . sodium chloride flush (NS) 0.9 % injection 3 mL  3 mL Intravenous Once Dede Query, MD         Discharge Medications: Please see discharge summary for a list of discharge medications.  Relevant Imaging  Results:  Relevant Lab Results:   Additional Information SS# [id number]  Truddie Hidden, LCSW

## 2019-10-04 LAB — BASIC METABOLIC PANEL
Anion gap: 9 (ref 5–15)
BUN: 14 mg/dL (ref 8–23)
CO2: 26 mmol/L (ref 22–32)
Calcium: 9.3 mg/dL (ref 8.9–10.3)
Chloride: 109 mmol/L (ref 98–111)
Creatinine, Ser: 0.89 mg/dL (ref 0.44–1.00)
GFR calc Af Amer: >60 mL/min (ref >60)
GFR calc non Af Amer: 60 mL/min — ABNORMAL LOW (ref >60)
Glucose, Bld: 108 mg/dL — ABNORMAL HIGH (ref 70–99)
Potassium: 3.1 mmol/L — ABNORMAL LOW (ref 3.5–5.1)
Sodium: 144 mmol/L (ref 135–145)

## 2019-10-04 LAB — CBC
HCT: 40.3 % (ref 36.0–46.0)
Hemoglobin: 13.4 g/dL (ref 12.0–15.0)
MCH: 31.7 pg (ref 26.0–34.0)
MCHC: 33.3 g/dL (ref 30.0–36.0)
MCV: 95.3 fL (ref 80.0–100.0)
Platelets: 208 10*3/uL (ref 150–400)
RBC: 4.23 MIL/uL (ref 3.87–5.11)
RDW: 13 % (ref 11.5–15.5)
WBC: 7.8 10*3/uL (ref 4.0–10.5)
nRBC: 0 % (ref 0.0–0.2)

## 2019-10-04 LAB — GLUCOSE, CAPILLARY
Glucose-Capillary: 148 mg/dL — ABNORMAL HIGH (ref 70–99)
Glucose-Capillary: 130 mg/dL — ABNORMAL HIGH (ref 70–99)

## 2019-10-04 MED ORDER — AMLODIPINE BESYLATE 10 MG PO TABS
10.0000 mg | ORAL_TABLET | Freq: Every day | ORAL | Status: DC
  Administered 2019-10-05 – 2019-10-06 (×2): 10 mg via ORAL
  Filled 2019-10-04: qty 1
  Filled 2019-10-04: qty 2

## 2019-10-04 MED ORDER — POTASSIUM CHLORIDE CRYS ER 20 MEQ PO TBCR
30.0000 meq | EXTENDED_RELEASE_TABLET | ORAL | Status: AC
  Administered 2019-10-04 (×3): 30 meq via ORAL
  Filled 2019-10-04 (×3): qty 1

## 2019-10-04 MED ORDER — AMLODIPINE BESYLATE 5 MG PO TABS: 5.0000 mg | ORAL_TABLET | Freq: Once | ORAL | Status: DC

## 2019-10-04 NOTE — TOC Progression Note (Addendum)
Transition of Care [facility]) - Progression Note    Patient Details  Name: CAOIMHE DAMRON MRN: [id number] Date of Birth: [birth year]  Transition of [facility]) CM/SW [place], [place] Phone Number: 10/04/2019, 11:35 AM  Clinical Narrative:    CSW met bedside with patient and provided bed offers. Patient provided permission for CSW to contact her daughter Jeani Hawking.   CSW spoke with Jeani Hawking and provided bed offers. Jeani Hawking requested patient discharge to [facility].  CSW initiated insurance authorization with [employer]. Authorization [id number], good for 5 business days starting 4/26. Contacted [employer]. Left a message. CSW will continue to follow.  Expected Discharge Plan: [place] Barriers to Discharge: Continued Medical Work up, No SNF bed, Ship broker  Expected Discharge Plan and Services Expected Discharge Plan: [place] In-house Referral: Clinical Social Work   Post Acute Care Choice: [place] Living arrangements for the past 2 months: Single Family Home                                       Social Determinants of Health (SDOH) Interventions    Readmission Risk Interventions No flowsheet data found.

## 2019-10-04 NOTE — Progress Notes (Signed)
PROGRESS NOTE                                                                                                                                                                                                             Patient Demographics:    Alyssa Adams, is a 86 y.o. female, DOB - [birth year], ZOX:[id number]  Admit date - 09/30/2019   Admitting Physician Starleen Arms, MD  Outpatient Primary MD for the patient is Jerl Mina, MD  LOS - 3   Chief Complaint  Patient presents with  . Code Stroke       Brief Narrative   86 y.o. female with medical history significant of stroke with residual right-sided weakness, HTN, GERD, depression who presented with slurred speech and altered mental status.  Patient has some short-term memory deficits, patient with known history of CVA in the past, with known residual right-sided weakness , was significant for small acute infarct in the posterior medial left temporal lobe.   Subjective:    Alyssa Adams today denies any chest pain, shortness of breath, no nausea or vomiting, she denies any new focal deficits.   Assessment  & Plan :    Active Problems:   Hypertensive emergency   History of stroke in adulthood   Essential hypertension   GERD (gastroesophageal reflux disease)   Depression   Cerebrovascular accident (CVA) due to thrombosis of left posterior cerebral artery (HCC)   Acute CVA -Patient presents with slurred speech, and altered mental status, this appears to be improving, her MRI significant for acute CVA in left temporal region. -Head and neck with no evidence of large vessel occlusion, but with chronic calcification in the left ICA and ACA and MCA origin stenosis, as well as mild distal right vertebral artery stenosis, neurology input greatly appreciated, current recommendation is for dual antiplatelet therapy aspirin and Plavix for 3 weeks, then aspirin alone. -2D echo with EF 70 to  75%. -PT/OT, initial recommendation for CIR versus SNF, after CIR evaluation and family discussion, plan for SNF placement, TOC is following. -SLP consulted, status post modified barium swallowing yesterday, currently advance to regular diet with thin liquid  COVID-19 Labs  No results for input(s): DDIMER, FERRITIN, LDH, CRP in the last 72 hours.  Lab Results  Component Value Date   SARSCOV2NAA NEGATIVE 09/30/2019   Essential hypertension/hypertensive  urgency -Blood pressure significantly elevated on presentation 196/80, given now confirmed acute CVA, initially on permissive hypertension, now blood pressure started to increase, make for gradual control, remains elevated on 5 mg of Norvasc, will go ahead and increase to 10 mg oral daily .  Hypokalemia  -Repleted, recheck in a.m. .  History of CVA -With spastic right-sided hemiparesis,  started on baclofen per rehab recommendation.  GERD -Continue with PPI  Hyperlipidemia -LDL is 111, now on Lipitor.    Code Status : Full  Family communication: Discussed with daughter via phone 4/23, none at bedside today   Disposition Plan  :  Status is: Inpatient  Remains inpatient appropriate because: Acute CVA   Dispo: The patient is from: Home              Anticipated d/c is to: SNF              Anticipated d/c date is: When SNF bed is available, will send Covid test today              Patient currently stable for discharge once SNF bed is available.  Consults  :  Neuro, rehab  Procedures  : None  DVT Prophylaxis  :  [place] heparin  Lab Results  Component Value Date   PLT 208 10/04/2019    Antibiotics  :    Anti-infectives (From admission, onward)   None        Objective:   Vitals:   10/03/19 0742 10/03/19 1555 10/03/19 2102 10/04/19 0853  BP: (!) 154/66 (!) 145/63 (!) 169/66 (!) 158/63  Pulse: (!) 57 65 60 (!) 56  Resp:  18 18 17   Temp: 97.7 F (36.5 C) (!) 97.5 F (36.4 C) 98.8 F (37.1 C) (!) 97.4 F (36.3  C)  TempSrc: Oral  Oral Oral  SpO2: 96% 98% 99% 97%  Weight:      Height:        Wt Readings from Last 3 Encounters:  10/02/19 66 kg  04/26/16 68 kg  03/17/16 69.1 kg    No intake or output data in the 24 hours ending 10/04/19 1123   Physical Exam  Awake Alert, Oriented X 3, No new F.N deficits, Normal affect Symmetrical Chest wall movement, Good air movement bilaterally, CTAB RRR,No Gallops,Rubs or new Murmurs, No Parasternal Heave +ve B.Sounds, Abd Soft, No tenderness, No rebound - guarding or rigidity. No Cyanosis, Clubbing or edema, No new Rash or bruise   Chronic right-sided hemiparesis     Data Review:    CBC Recent Labs  Lab 09/30/19 1653 09/30/19 1707 10/01/19 0347 10/02/19 0730 10/04/19 0526  WBC 7.6  --  9.1 6.8 7.8  HGB 13.6 13.6 12.9 12.9 13.4  HCT 41.8 40.0 39.3 39.2 40.3  PLT 253  --  208 216 208  MCV 97.4  --  97.0 96.3 95.3  MCH 31.7  --  31.9 31.7 31.7  MCHC 32.5  --  32.8 32.9 33.3  RDW 13.4  --  13.2 13.2 13.0  LYMPHSABS 2.2  --   --   --   --   MONOABS 0.5  --   --   --   --   EOSABS 0.3  --   --   --   --   BASOSABS 0.0  --   --   --   --     Chemistries  Recent Labs  Lab 09/30/19 1653 09/30/19 1707 10/01/19 0347 10/02/19 0730 10/04/19 10/06/19  NA 141 140 142 142 144  K 4.8 5.4* 3.3* 3.3* 3.1*  CL 108 105 106 107 109  CO2 24  --  23 26 26   GLUCOSE 113* 108* 116* 107* 108*  BUN 14 21 11 13 14   CREATININE 0.84 0.80 0.89 0.89 0.89  CALCIUM 9.0  --  9.1 9.0 9.3  AST 35  --  19  --   --   ALT 15  --  20  --   --   ALKPHOS 69  --  78  --   --   BILITOT 1.7*  --  0.9  --   --    ------------------------------------------------------------------------------------------------------------------ No results for input(s): CHOL, HDL, LDLCALC, TRIG, CHOLHDL, LDLDIRECT in the last 72 hours.  Lab Results  Component Value Date   HGBA1C 5.6 10/01/2019    ------------------------------------------------------------------------------------------------------------------ No results for input(s): TSH, T4TOTAL, T3FREE, THYROIDAB in the last 72 hours.  Invalid input(s): FREET3 ------------------------------------------------------------------------------------------------------------------ No results for input(s): VITAMINB12, FOLATE, FERRITIN, TIBC, IRON, RETICCTPCT in the last 72 hours.  Coagulation profile No results for input(s): INR, PROTIME in the last 168 hours.  No results for input(s): DDIMER in the last 72 hours.  Cardiac Enzymes No results for input(s): CKMB, TROPONINI, MYOGLOBIN in the last 168 hours.  Invalid input(s): CK ------------------------------------------------------------------------------------------------------------------ No results found for: BNP  Inpatient Medications  Scheduled Meds: . amLODipine  5 mg Oral Daily  . aspirin EC  81 mg Oral Daily  . atorvastatin  40 mg Oral Daily  . baclofen  5 mg Oral QHS  . cholestyramine light  4 g Oral QAC breakfast  . clopidogrel  75 mg Oral Daily  . heparin injection (subcutaneous)  5,000 Units Subcutaneous Q8H  . mirabegron ER  50 mg Oral Daily  . pantoprazole  40 mg Oral Daily  . potassium chloride  30 mEq Oral Q4H  . sodium chloride flush  3 mL Intravenous Once   Continuous Infusions:  PRN Meds:.acetaminophen **OR** acetaminophen (TYLENOL) oral liquid 160 mg/5 mL **OR** acetaminophen, ondansetron (ZOFRAN) IV, Resource ThickenUp Clear, senna-docusate  Micro Results Recent Results (from the past 240 hour(s))  Respiratory Panel by RT PCR (Flu A&B, Covid) - Nasopharyngeal Swab     Status: None   Collection Time: 09/30/19  5:32 PM   Specimen: Nasopharyngeal Swab  Result Value Ref Range Status   SARS Coronavirus 2 by RT PCR NEGATIVE NEGATIVE Final    Comment: (NOTE) SARS-CoV-2 target nucleic acids are NOT DETECTED. The SARS-CoV-2 RNA is generally detectable in  upper respiratoy specimens during the acute phase of infection. The lowest concentration of SARS-CoV-2 viral copies this assay can detect is 131 copies/mL. A negative result does not preclude SARS-Cov-2 infection and should not be used as the sole basis for treatment or other patient management decisions. A negative result may occur with  improper specimen collection/handling, submission of specimen other than nasopharyngeal swab, presence of viral mutation(s) within the areas targeted by this assay, and inadequate number of viral copies (<131 copies/mL). A negative result must be combined with clinical observations, patient history, and epidemiological information. The expected result is Negative. Fact Sheet for Patients:  https://www.moore.com/https://www.fda.gov/media/142436/download Fact Sheet for Healthcare Providers:  https://www.young.biz/https://www.fda.gov/media/142435/download This test is not yet ap proved or cleared by the [place] FDA and  has been authorized for detection and/or diagnosis of SARS-CoV-2 by FDA under an Emergency Use Authorization (EUA). This EUA will remain  in effect (meaning this test can be used) for the duration of the COVID-19 declaration  under Section 564(b)(1) of the Act, 21 U.S.C. section 360bbb-3(b)(1), unless the authorization is terminated or revoked sooner.    Influenza A by PCR NEGATIVE NEGATIVE Final   Influenza B by PCR NEGATIVE NEGATIVE Final    Comment: (NOTE) The Xpert Xpress SARS-CoV-2/FLU/RSV assay is intended as an aid in  the diagnosis of influenza from Nasopharyngeal swab specimens and  should not be used as a sole basis for treatment. Nasal washings and  aspirates are unacceptable for Xpert Xpress SARS-CoV-2/FLU/RSV  testing. Fact Sheet for Patients: https://www.moore.com/ Fact Sheet for Healthcare Providers: https://www.young.biz/ This test is not yet approved or cleared by the [place] FDA and  has been authorized for  detection and/or diagnosis of SARS-CoV-2 by  FDA under an Emergency Use Authorization (EUA). This EUA will remain  in effect (meaning this test can be used) for the duration of the  Covid-19 declaration under Section 564(b)(1) of the Act, 21  U.S.C. section 360bbb-3(b)(1), unless the authorization is  terminated or revoked. Performed at [facility], [address], [place], [place] [address]     Radiology Reports CT Code Stroke CTA Head W/WO contrast  Result Date: 09/30/2019 CLINICAL DATA:  Altered mental status.  Weakness. EXAM: CT ANGIOGRAPHY HEAD AND NECK TECHNIQUE: Multidetector CT imaging of the head and neck was performed using the standard protocol during bolus administration of intravenous contrast. Multiplanar CT image reconstructions and MIPs were obtained to evaluate the vascular anatomy. Carotid stenosis measurements (when applicable) are obtained utilizing NASCET criteria, using the distal internal carotid diameter as the denominator. CONTRAST:  60mL OMNIPAQUE IOHEXOL 350 MG/ML SOLN COMPARISON:  None. FINDINGS: CTA NECK FINDINGS Aortic arch: Standard 3 vessel aortic arch with minimal atherosclerotic plaque. Widely patent arch vessel origins. Right carotid system: Patent with a moderate amount of predominantly calcified plaque at the carotid bifurcation. No evidence of significant stenosis or dissection. Tortuous proximal common carotid artery. Left carotid system: Patent with extensive calcified plaque at the carotid bifurcation resulting in less than 50% narrowing of the ICA origin. Tortuous proximal common carotid artery. Vertebral arteries: Patent and codominant without evidence of stenosis or dissection. Skeleton: Moderate diffuse cervical disc degeneration. Other neck: No evidence of cervical lymphadenopathy or mass. Upper chest: Clear lung apices. Review of the MIP images confirms the above findings CTA HEAD FINDINGS Anterior circulation: The internal carotid arteries are  patent from skull base to carotid termini. Calcified plaque in the cavernous and proximal supraclinoid segments bilaterally does not result in significant stenosis. There is a punctate calcification at the left ICA terminus, favored to be chronic as it was likely faintly visible on a 2006 head CT. This likely results in moderate left MCA origin stenosis and potentially severe left ACA origin stenosis. The left A1 segment is hypoplastic, and the left ACA is primarily supplied from the right via the anterior communicating artery. The right A1 segment is widely patent. Both MCAs are patent without evidence of proximal branch occlusion. There are mild left M1 and proximal right M2 stenoses. No aneurysm is identified. Posterior circulation: The intracranial vertebral arteries are patent to the basilar with calcified plaque resulting in mild proximal right V4 stenosis. Patent PICAs and SCAs are seen bilaterally. The basilar artery is patent and small in caliber congenitally. There are large posterior communicating arteries with hypoplastic right and absent left posterior communicating arteries. The PCAs are patent with branch vessel irregularity but no significant proximal stenosis. No aneurysm is identified. Venous sinuses: Poorly evaluated due to contrast timing.  Anatomic variants: Predominantly fetal origin of the PCAs. Review of the MIP images confirms the above findings IMPRESSION: 1. No large vessel occlusion. 2. Suspected chronic calcification at the left ICA terminus resulting in ACA and MCA origin stenoses. 3. Mild distal right vertebral artery stenosis. 4. Left greater than right cervical carotid artery atherosclerosis without significant stenosis. These results were communicated to Dr. Rory Percy at 5:20 pm on 09/30/2019 by text page via the [facility] messaging system. Electronically Signed   By: Logan Bores M.D.   On: 09/30/2019 17:34   DG Chest 1 View  Result Date: 09/30/2019 CLINICAL DATA:  Slurred speech EXAM:  CHEST  1 VIEW COMPARISON:  06/07/2012 FINDINGS: The heart size and mediastinal contours are within normal limits. Both lungs are clear. The visualized skeletal structures are unremarkable. IMPRESSION: No active disease. Electronically Signed   By: Donavan Foil M.D.   On: 09/30/2019 22:51   CT Code Stroke CTA Neck W/WO contrast  Result Date: 09/30/2019 CLINICAL DATA:  Altered mental status.  Weakness. EXAM: CT ANGIOGRAPHY HEAD AND NECK TECHNIQUE: Multidetector CT imaging of the head and neck was performed using the standard protocol during bolus administration of intravenous contrast. Multiplanar CT image reconstructions and MIPs were obtained to evaluate the vascular anatomy. Carotid stenosis measurements (when applicable) are obtained utilizing NASCET criteria, using the distal internal carotid diameter as the denominator. CONTRAST:  35mL OMNIPAQUE IOHEXOL 350 MG/ML SOLN COMPARISON:  None. FINDINGS: CTA NECK FINDINGS Aortic arch: Standard 3 vessel aortic arch with minimal atherosclerotic plaque. Widely patent arch vessel origins. Right carotid system: Patent with a moderate amount of predominantly calcified plaque at the carotid bifurcation. No evidence of significant stenosis or dissection. Tortuous proximal common carotid artery. Left carotid system: Patent with extensive calcified plaque at the carotid bifurcation resulting in less than 50% narrowing of the ICA origin. Tortuous proximal common carotid artery. Vertebral arteries: Patent and codominant without evidence of stenosis or dissection. Skeleton: Moderate diffuse cervical disc degeneration. Other neck: No evidence of cervical lymphadenopathy or mass. Upper chest: Clear lung apices. Review of the MIP images confirms the above findings CTA HEAD FINDINGS Anterior circulation: The internal carotid arteries are patent from skull base to carotid termini. Calcified plaque in the cavernous and proximal supraclinoid segments bilaterally does not result in  significant stenosis. There is a punctate calcification at the left ICA terminus, favored to be chronic as it was likely faintly visible on a 2006 head CT. This likely results in moderate left MCA origin stenosis and potentially severe left ACA origin stenosis. The left A1 segment is hypoplastic, and the left ACA is primarily supplied from the right via the anterior communicating artery. The right A1 segment is widely patent. Both MCAs are patent without evidence of proximal branch occlusion. There are mild left M1 and proximal right M2 stenoses. No aneurysm is identified. Posterior circulation: The intracranial vertebral arteries are patent to the basilar with calcified plaque resulting in mild proximal right V4 stenosis. Patent PICAs and SCAs are seen bilaterally. The basilar artery is patent and small in caliber congenitally. There are large posterior communicating arteries with hypoplastic right and absent left posterior communicating arteries. The PCAs are patent with branch vessel irregularity but no significant proximal stenosis. No aneurysm is identified. Venous sinuses: Poorly evaluated due to contrast timing. Anatomic variants: Predominantly fetal origin of the PCAs. Review of the MIP images confirms the above findings IMPRESSION: 1. No large vessel occlusion. 2. Suspected chronic calcification at the left ICA terminus resulting in  ACA and MCA origin stenoses. 3. Mild distal right vertebral artery stenosis. 4. Left greater than right cervical carotid artery atherosclerosis without significant stenosis. These results were communicated to Dr. Wilford Corner at 5:20 pm on 09/30/2019 by text page via the [facility] messaging system. Electronically Signed   By: Sebastian Ache M.D.   On: 09/30/2019 17:34   MR BRAIN WO CONTRAST  Result Date: 09/30/2019 CLINICAL DATA:  Possible stroke. EXAM: MRI HEAD WITHOUT CONTRAST TECHNIQUE: Multiplanar, multiecho pulse sequences of the brain and surrounding structures were obtained without  intravenous contrast. COMPARISON:  Brain MRI 07/19/2005 FINDINGS: Brain: Small focus of abnormal diffusion restriction along the posteromedial left temporal lobe. No other diffusion abnormality. Diffuse confluent hyperintense T2-weighted signal within the periventricular, deep and juxtacortical white matter, most commonly due to chronic ischemic microangiopathy. There is generalized atrophy without lobar predilection. There are a few chronic microhemorrhages in a predominantly central distribution. Normal midline structures. Vascular: Normal flow voids. Skull and upper cervical spine: Normal marrow signal. Sinuses/Orbits: Negative. Other: None. IMPRESSION: 1. Small focus of abnormal diffusion restriction along the posteromedial left temporal lobe, consistent with a small acute infarct. 2. Severe chronic ischemic microangiopathy and generalized atrophy. Chronic micro 3. Chronic microhemorrhage within a predominantly central distribution, likely indicating chronic hypertensive angiopathy. Electronically Signed   By: Deatra Robinson M.D.   On: 09/30/2019 23:42   DG Swallowing Func-Speech Pathology  Result Date: 10/03/2019 Objective Swallowing Evaluation: Type of Study: MBS-Modified Barium Swallow Study  Patient Details Name: KADEE PHILYAW MRN: [id number] Date of Birth: September 19, 1933 Today's Date: 10/03/2019 Time: SLP Start Time (ACUTE ONLY): 0900 -SLP Stop Time (ACUTE ONLY): 0920 SLP Time Calculation (min) (ACUTE ONLY): 20 min Past Medical History: Past Medical History: Diagnosis Date . Depression  . GERD (gastroesophageal reflux disease)  . Hypertension  . Stroke (cerebrum) [facility])  Past Surgical History: Past Surgical History: Procedure Laterality Date . ABDOMINAL HYSTERECTOMY   . GALLBLADDER SURGERY   HPI: Pt is 86 y.o. female with medical history of stroke with residual right-sided weakness, HTN, GERD, depression who presented with slurred speech and altered mental status. MRI revealed small abnormal diffusion  restriction along the posteromedial L temporal lobe consistent with small acute infarct. CXR no active disease.  No data recorded Assessment / Plan / Recommendation CHL IP CLINICAL IMPRESSIONS 10/03/2019 Clinical Impression Pt exhibited mild pharyngeal dysphagia suspected to be mostly chronic possibly exacerbated by acute stroke. She reports intermittent coughing with liquids "for years". There was min-mild vallecular and pyriform sinus residue possibly due to inefficient pharyngeal contraction (?cervical rigidity- cervical calcificaiton observed). Thin barium residue from lateral channels and pyriform sinuses spilled into her laryngeal vestibule after the swallow and fell below the vocal cords eliciting reflexive cough however did not fully clear as barium seen in trachea. Supraglottic breath hold strategy was not effective. Chin tuck would thought to be counterintuitive given pyriform sinues residue however it increased coordination and protection 90% of the time. Esophageal scan did not reveal overt abnormalities (MBS does not diagnose below the level of the UES). Recommend regular texture, thin liquids (chin tuck with liquids only), meds whole in puree, throat clear during meals.      SLP Visit Diagnosis Dysphagia, pharyngeal phase (R13.13) Attention and concentration deficit following -- Frontal lobe and executive function deficit following -- Impact on safety and function --   CHL IP TREATMENT RECOMMENDATION 10/03/2019 Treatment Recommendations Therapy as outlined in treatment plan below   Prognosis 10/03/2019 Prognosis for Safe Diet Advancement Good Barriers to Reach  Goals -- Barriers/Prognosis Comment -- CHL IP DIET RECOMMENDATION 10/03/2019 SLP Diet Recommendations Regular solids;Thin liquid Liquid Administration via Cup;No straw Medication Administration Whole meds with puree Compensations Slow rate;Small sips/bites;Chin tuck;Other (Comment) Postural Changes Seated upright at 90 degrees;Remain semi-upright  after after feeds/meals (Comment)   CHL IP OTHER RECOMMENDATIONS 10/03/2019 Recommended Consults -- Oral Care Recommendations Oral care BID Other Recommendations --   CHL IP FOLLOW UP RECOMMENDATIONS 10/03/2019 Follow up Recommendations None   CHL IP FREQUENCY AND DURATION 10/03/2019 Speech Therapy Frequency (ACUTE ONLY) min 2x/week Treatment Duration 2 weeks      CHL IP ORAL PHASE 10/03/2019 Oral Phase WFL Oral - Pudding Teaspoon -- Oral - Pudding Cup -- Oral - Honey Teaspoon -- Oral - Honey Cup -- Oral - Nectar Teaspoon -- Oral - Nectar Cup -- Oral - Nectar Straw -- Oral - Thin Teaspoon -- Oral - Thin Cup -- Oral - Thin Straw -- Oral - Puree -- Oral - Mech Soft -- Oral - Regular -- Oral - Multi-Consistency -- Oral - Pill -- Oral Phase - Comment --  CHL IP PHARYNGEAL PHASE 10/03/2019 Pharyngeal Phase Impaired Pharyngeal- Pudding Teaspoon -- Pharyngeal -- Pharyngeal- Pudding Cup -- Pharyngeal -- Pharyngeal- Honey Teaspoon -- Pharyngeal -- Pharyngeal- Honey Cup -- Pharyngeal -- Pharyngeal- Nectar Teaspoon -- Pharyngeal -- Pharyngeal- Nectar Cup WFL Pharyngeal -- Pharyngeal- Nectar Straw -- Pharyngeal -- Pharyngeal- Thin Teaspoon -- Pharyngeal -- Pharyngeal- Thin Cup Pharyngeal residue - valleculae;Penetration/Aspiration during swallow Pharyngeal Material enters airway, remains ABOVE vocal cords then ejected out Pharyngeal- Thin Straw Pharyngeal residue - valleculae;Pharyngeal residue - pyriform;Penetration/Aspiration during swallow;Penetration/Apiration after swallow Pharyngeal Material enters airway, passes BELOW cords and not ejected out despite cough attempt by patient Pharyngeal- Puree -- Pharyngeal -- Pharyngeal- Mechanical Soft -- Pharyngeal -- Pharyngeal- Regular WFL Pharyngeal -- Pharyngeal- Multi-consistency -- Pharyngeal -- Pharyngeal- Pill -- Pharyngeal -- Pharyngeal Comment --  CHL IP CERVICAL ESOPHAGEAL PHASE 10/03/2019 Cervical Esophageal Phase WFL Pudding Teaspoon -- Pudding Cup -- Honey Teaspoon -- Honey  Cup -- Nectar Teaspoon -- Nectar Cup -- Nectar Straw -- Thin Teaspoon -- Thin Cup -- Thin Straw -- Puree -- Mechanical Soft -- Regular -- Multi-consistency -- Pill -- Cervical Esophageal Comment -- Royce Macadamia 10/03/2019, 1:46 PM  Breck Coons [employer] administrator Pager [phone] Office [phone]             ECHOCARDIOGRAM COMPLETE BUBBLE STUDY  Result Date: 10/01/2019    ECHOCARDIOGRAM REPORT   Patient Name:   TONDRA REIERSON [facility] Date of Exam: 10/01/2019 Medical Rec #:  [id number]         Height:       62.0 in Accession #:    [id number]        Weight:       153.2 lb Date of Birth:  [birth year]         BSA:          1.707 m Patient Age:    85 years          BP:           169/72 mmHg Patient Gender: F                 HR:           63 bpm. Exam Location:  Inpatient Procedure: 2D Echo and Saline Contrast Bubble Study Indications:    Stroke  History:        Patient has no prior history of Echocardiogram examinations.  Risk Factors:Hypertension.  Sonographer:    Thurman Coyer RDCS (AE) Referring Phys: [id number] NA LI IMPRESSIONS  1. Left ventricular ejection fraction, by estimation, is 70 to 75%. The left ventricle has hyperdynamic function. The left ventricle has no regional wall motion abnormalities. Left ventricular diastolic parameters are consistent with Grade I diastolic dysfunction (impaired relaxation).  2. Right ventricular systolic function is normal. The right ventricular size is normal. Tricuspid regurgitation signal is inadequate for assessing PA pressure.  3. The mitral valve is grossly normal. No evidence of mitral valve regurgitation. No evidence of mitral stenosis.  4. The aortic valve is tricuspid. Aortic valve regurgitation is not visualized. No aortic stenosis is present.  5. The inferior vena cava is normal in size with greater than 50% respiratory variability, suggesting right atrial pressure of 3 mmHg.  6. Agitated saline contrast bubble study was  negative, with no evidence of any interatrial shunt. Conclusion(s)/Recommendation(s): No intracardiac source of embolism detected on this transthoracic study. A transesophageal echocardiogram is recommended to exclude cardiac source of embolism if clinically indicated. FINDINGS  Left Ventricle: Left ventricular ejection fraction, by estimation, is 70 to 75%. The left ventricle has hyperdynamic function. The left ventricle has no regional wall motion abnormalities. The left ventricular internal cavity size was normal in size. There is no left ventricular hypertrophy. Left ventricular diastolic parameters are consistent with Grade I diastolic dysfunction (impaired relaxation). Right Ventricle: The right ventricular size is normal. No increase in right ventricular wall thickness. Right ventricular systolic function is normal. Tricuspid regurgitation signal is inadequate for assessing PA pressure. Left Atrium: Left atrial size was normal in size. Right Atrium: Right atrial size was normal in size. Pericardium: Trivial pericardial effusion is present. Presence of pericardial fat pad. Mitral Valve: The mitral valve is grossly normal. No evidence of mitral valve regurgitation. No evidence of mitral valve stenosis. Tricuspid Valve: The tricuspid valve is grossly normal. Tricuspid valve regurgitation is not demonstrated. No evidence of tricuspid stenosis. Aortic Valve: The aortic valve is tricuspid. Aortic valve regurgitation is not visualized. No aortic stenosis is present. Pulmonic Valve: The pulmonic valve was grossly normal. Pulmonic valve regurgitation is not visualized. No evidence of pulmonic stenosis. Aorta: The aortic root and ascending aorta are structurally normal, with no evidence of dilitation. Venous: The inferior vena cava is normal in size with greater than 50% respiratory variability, suggesting right atrial pressure of 3 mmHg. IAS/Shunts: The atrial septum is grossly normal. Agitated saline contrast was  given intravenously to evaluate for intracardiac shunting. Agitated saline contrast bubble study was negative, with no evidence of any interatrial shunt.  LEFT VENTRICLE PLAX 2D LVIDd:         3.60 cm  Diastology LVIDs:         2.40 cm  LV e' lateral:   6.22 cm/s LV PW:         1.00 cm  LV E/e' lateral: 10.0 LV IVS:        1.00 cm  LV e' medial:    5.33 cm/s LVOT diam:     1.80 cm  LV E/e' medial:  11.7 LV SV:         68 LV SV Index:   40 LVOT Area:     2.54 cm  RIGHT VENTRICLE RV S prime:     16.60 cm/s TAPSE (M-mode): 2.1 cm LEFT ATRIUM             Index       RIGHT ATRIUM  Index LA diam:        2.30 cm 1.35 cm/m  RA Area:     10.10 cm LA Vol (A2C):   46.1 ml 27.01 ml/m RA Volume:   18.20 ml  10.66 ml/m LA Vol (A4C):   44.2 ml 25.89 ml/m LA Biplane Vol: 45.1 ml 26.42 ml/m  AORTIC VALVE LVOT Vmax:   124.00 cm/s LVOT Vmean:  96.900 cm/s LVOT VTI:    0.268 m  AORTA Ao Root diam: 2.60 cm MITRAL VALVE MV Area (PHT): 1.65 cm     SHUNTS MV Decel Time: 461 msec     Systemic VTI:  0.27 m MV E velocity: 62.10 cm/s   Systemic Diam: 1.80 cm MV A velocity: 101.00 cm/s MV E/A ratio:  0.61 Lennie Odor MD Electronically signed by Lennie Odor MD Signature Date/Time: 10/01/2019/3:47:30 PM    Final    CT HEAD CODE STROKE WO CONTRAST  Result Date: 09/30/2019 CLINICAL DATA:  Code stroke. Possible stroke, ataxia, stroke suspected. Additional history provided: Last known well 1300 EXAM: CT HEAD WITHOUT CONTRAST TECHNIQUE: Contiguous axial images were obtained from the base of the skull through the vertex without intravenous contrast. COMPARISON:  Brain MRI 07/19/2005, brain MRI 08/01/2004, head CT 07/31/2004 FINDINGS: Brain: There is no evidence of acute intracranial hemorrhage. Ischemic changes within the left lentiform nucleus, progressed as compared to MRI 07/19/2005, but otherwise age indeterminate. Redemonstrated chronic lacunar infarct within the left corona radiata. Background advanced chronic small vessel  ischemic disease and moderate generalized parenchymal atrophy, also progressed. There is no evidence of intracranial mass. No midline shift or extra-axial fluid collection. Vascular: No hyperdense vessel.  Atherosclerotic calcifications. Skull: Normal. Negative for fracture or focal lesion. Sinuses/Orbits: Visualized orbits demonstrate no acute abnormality. No significant paranasal sinus disease or mastoid effusion at the imaged levels. ASPECTS ([place] Stroke Program Early CT Score) - Ganglionic level infarction (caudate, lentiform nuclei, internal capsule, insula, M1-M3 cortex): 6 (point subtracted for age-indeterminate ischemic changes within the left lentiform nucleus) - Supraganglionic infarction (M4-M6 cortex): 3 Total score (0-10 with 10 being normal): 9 These results were called by telephone at the time of interpretation on 09/30/2019 at 5:14 pm to provider Dr. Wilford Corner, who verbally acknowledged these results. IMPRESSION: 1. Age-indeterminate ischemic changes within the left lentiform nucleus. Aspects 9. 2. No evidence of acute intracranial hemorrhage or acute demarcated cortical infarct. 3. Advanced chronic small vessel ischemic disease has progressed as compared to prior MRI 07/19/2005. Redemonstrated chronic lacunar infarct within the left corona radiata. 4. Moderate generalized parenchymal atrophy. Electronically Signed   By: Jackey Loge DO   On: 09/30/2019 17:17     Mliss Fritz Syvanna Ciolino M.D on 10/04/2019 at 11:23 AM  Between 7am to 7pm - Pager - [phone]  After 7pm go to [email or web address] - password [facility]  [facility] -  Office  [phone]

## 2019-10-04 NOTE — Progress Notes (Signed)
Physical Therapy Treatment Patient Details Name: Alyssa Adams MRN: [id number] DOB: 02-25-1934 Today's Date: 10/04/2019    History of Present Illness Pt is 86 y.o. female with medical history significant of stroke with residual right-sided weakness, HTN, GERD, depression who presented with slurred speech and altered mental status.  Pt admitted with HTN and CVA.  MRI revealed small abnormal diffusion restriction along the posteromedial L temporal lobe consistent with small acute infarct.    PT Comments    Pt liking the thoughts of getting up.  A little disapointed in not getting to go to rehab.  Emphasis on warm up, transition to EOB, sit to stand and progressing gait.   Follow Up Recommendations  SNF;Supervision/Assistance - 24 hour     Equipment Recommendations  None recommended by PT    Recommendations for Other Services       Precautions / Restrictions Precautions Precautions: Fall Restrictions Weight Bearing Restrictions: No    Mobility  Bed Mobility Overal bed mobility: Needs Assistance Bed Mobility: Supine to Sit     Supine to sit: Min guard     General bed mobility comments: up from relatively flat bed with no rail, but some struggle  Transfers Overall transfer level: Needs assistance Equipment used: Rolling walker (2 wheeled) Transfers: Sit to/from Stand Sit to Stand: Min assist         General transfer comment: cues for hand placement,  assist to come forward more than up.  Ambulation/Gait Ambulation/Gait assistance: Min assist Gait Distance (Feet): 90 Feet Assistive device: Rolling walker (2 wheeled) Gait Pattern/deviations: Step-through pattern;Decreased stride length Gait velocity: decr Gait velocity interpretation: <1.8 ft/sec, indicate of risk for recurrent falls General Gait Details: stable paretic gait, minor stability assist with assist to maneuver the RW with only 1 hand.   Stairs             Wheelchair Mobility    Modified  Rankin (Stroke Patients Only) Modified Rankin (Stroke Patients Only) Modified Rankin: Moderately severe disability     Balance Overall balance assessment: Needs assistance   Sitting balance-Leahy Scale: Good     Standing balance support: Single extremity supported;During functional activity Standing balance-Leahy Scale: Fair                              Cognition Arousal/Alertness: Awake/alert Behavior During Therapy: WFL for tasks assessed/performed Overall Cognitive Status: Within Functional Limits for tasks assessed(NT formally) Area of Impairment: Safety/judgement                         Safety/Judgement: Decreased awareness of deficits     General Comments: pt denies fall risk, but agreeable to rehab in SNF by end of session      Exercises Other Exercises Other Exercises: warm up resistive LE ROM    General Comments        Pertinent Vitals/Pain Pain Assessment: No/denies pain    Home Living                      Prior Function            PT Goals (current goals can now be found in the care plan section) Acute Rehab PT Goals PT Goal Formulation: With patient Time For Goal Achievement: 10/15/19 Potential to Achieve Goals: Good Progress towards PT goals: Progressing toward goals    Frequency    Min 3X/week  PT Plan Current plan remains appropriate;Discharge plan needs to be updated    Co-evaluation              AM-PAC PT "6 Clicks" Mobility   Outcome Measure  Help needed turning from your back to your side while in a flat bed without using bedrails?: A Little Help needed moving from lying on your back to sitting on the side of a flat bed without using bedrails?: A Little Help needed moving to and from a bed to a chair (including a wheelchair)?: A Little Help needed standing up from a chair using your arms (e.g., wheelchair or bedside chair)?: A Little Help needed to walk in hospital room?: A Little Help  needed climbing 3-5 steps with a railing? : A Lot 6 Click Score: 17    End of Session   Activity Tolerance: Patient tolerated treatment well Patient left: in chair;with call bell/phone within reach;with chair alarm set Nurse Communication: Mobility status PT Visit Diagnosis: Unsteadiness on feet (R26.81);Other abnormalities of gait and mobility (R26.89);Muscle weakness (generalized) (M62.81)     Time: 2458-0998 PT Time Calculation (min) (ACUTE ONLY): 29 min  Charges:  $Gait Training: 8-22 mins $Therapeutic Activity: 8-22 mins                     10/04/2019  Ginger Carne., PT Acute Rehabilitation Services [phone]  (pager) [phone]  (office)   Tessie Fass Roark Rufo 10/04/2019, 4:57 PM

## 2019-10-05 LAB — SARS CORONAVIRUS 2 (TAT 6-24 HRS): SARS Coronavirus 2: NEGATIVE

## 2019-10-05 LAB — BASIC METABOLIC PANEL
Anion gap: 8 (ref 5–15)
BUN: 14 mg/dL (ref 8–23)
CO2: 28 mmol/L (ref 22–32)
Calcium: 9.2 mg/dL (ref 8.9–10.3)
Chloride: 107 mmol/L (ref 98–111)
Creatinine, Ser: 0.96 mg/dL (ref 0.44–1.00)
GFR calc Af Amer: >60 mL/min (ref >60)
GFR calc non Af Amer: 55 mL/min — ABNORMAL LOW (ref >60)
Glucose, Bld: 110 mg/dL — ABNORMAL HIGH (ref 70–99)
Potassium: 3.4 mmol/L — ABNORMAL LOW (ref 3.5–5.1)
Sodium: 143 mmol/L (ref 135–145)

## 2019-10-05 LAB — GLUCOSE, CAPILLARY: Glucose-Capillary: 99 mg/dL (ref 70–99)

## 2019-10-05 MED ORDER — POTASSIUM CHLORIDE CRYS ER 20 MEQ PO TBCR
40.0000 meq | EXTENDED_RELEASE_TABLET | Freq: Once | ORAL | Status: AC
  Administered 2019-10-05: 40 meq via ORAL
  Filled 2019-10-05: qty 2

## 2019-10-05 MED ORDER — HYDRALAZINE HCL 10 MG PO TABS
10.0000 mg | ORAL_TABLET | Freq: Four times a day (QID) | ORAL | Status: DC
  Administered 2019-10-05 – 2019-10-06 (×4): 10 mg via ORAL
  Filled 2019-10-05 (×3): qty 1

## 2019-10-05 NOTE — Progress Notes (Signed)
PROGRESS NOTE                                                                                                                                                                                                             Patient Demographics:    Alyssa Adams, is a 86 y.o. female, DOB - 10/30/1933, ZMO:[id number]  Admit date - 09/30/2019   Admitting Physician Starleen Arms, MD  Outpatient Primary MD for the patient is Jerl Mina, MD  LOS - 4   Chief Complaint  Patient presents with  . Code Stroke       Brief Narrative   86 y.o. female with medical history significant of stroke with residual right-sided weakness, HTN, GERD, depression who presented with slurred speech and altered mental status.  Patient has some short-term memory deficits, patient with known history of CVA in the past, with known residual right-sided weakness , was significant for small acute infarct in the posterior medial left temporal lobe.   Subjective:    Alyssa Adams today denies any chest pain, shortness of breath, no nausea or vomiting, she denies any complaints today.   Assessment  & Plan :    Active Problems:   Hypertensive emergency   History of stroke in adulthood   Essential hypertension   GERD (gastroesophageal reflux disease)   Depression   Cerebrovascular accident (CVA) due to thrombosis of left posterior cerebral artery (HCC)   Acute CVA -Patient presents with slurred speech, and altered mental status, this appears to be improving, her MRI significant for acute CVA in left temporal region. -Head and neck with no evidence of large vessel occlusion, but with chronic calcification in the left ICA and ACA and MCA origin stenosis, as well as mild distal right vertebral artery stenosis, neurology input greatly appreciated, current recommendation is for dual antiplatelet therapy aspirin and Plavix for 3 weeks, then aspirin alone. -2D echo with EF 70 to  75%. -PT/OT, initial recommendation for CIR versus SNF, after CIR evaluation and family discussion, plan for SNF placement, TOC is following. -SLP consulted, status post modified barium swallowing yesterday, currently advance to regular diet with thin liquid  COVID-19 Labs  No results for input(s): DDIMER, FERRITIN, LDH, CRP in the last 72 hours.  Lab Results  Component Value Date   SARSCOV2NAA NEGATIVE 09/30/2019   Essential hypertension/hypertensive urgency -  Blood pressure significantly elevated on presentation 196/80, given now confirmed acute CVA, initially on permissive hypertension, now blood pressure started to increase, will aim for gradual control, pressure remains elevated after increasing Norvasc to 10 mg oral daily, will start on hydralazine 10 mg oral 4 times daily .  Hypokalemia  -It is low today at 3.4, repleted again, will recheck in a.m.  History of CVA -With spastic right-sided hemiparesis,  started on baclofen per rehab recommendation.  GERD -Continue with PPI  Hyperlipidemia -LDL is 111, now on Lipitor.    Code Status : Full  Family communication: Discussed with daughter via phone 4/23, none at bedside today   Disposition Plan  :  Status is: Inpatient  Remains inpatient appropriate because: Acute CVA   Dispo: The patient is from: Home              Anticipated d/c is to: SNF              Anticipated d/c date is: When SNF bed is available, discussed with social worker, hopefully bed should be available by tomorrow.              Patient currently stable for discharge once SNF bed is available.  Consults  :  Neuro, rehab  Procedures  : None  DVT Prophylaxis  :  [place] heparin  Lab Results  Component Value Date   PLT 208 10/04/2019    Antibiotics  :    Anti-infectives (From admission, onward)   None        Objective:   Vitals:   10/04/19 0853 10/04/19 1633 10/04/19 2111 10/05/19 0824  BP: (!) 158/63 (!) 129/50 (!) 135/56 (!) 168/63    Pulse: (!) 56 70 68 (!) 55  Resp: 17 17 18 16   Temp: (!) 97.4 F (36.3 C) 97.7 F (36.5 C) 98.6 F (37 C) (!) 97.4 F (36.3 C)  TempSrc: Oral  Oral   SpO2: 97% 96% 96% 97%  Weight:      Height:        Wt Readings from Last 3 Encounters:  10/02/19 66 kg  04/26/16 68 kg  03/17/16 69.1 kg    No intake or output data in the 24 hours ending 10/05/19 1203   Physical Exam  Awake Alert, Oriented X 3, No new F.N deficits, Normal affect Symmetrical Chest wall movement, Good air movement bilaterally, CTAB RRR,No Gallops,Rubs or new Murmurs, No Parasternal Heave +ve B.Sounds, Abd Soft, No tenderness, No rebound - guarding or rigidity. No Cyanosis, Clubbing or edema, No new Rash or bruise   Chronic right-sided hemiparesis     Data Review:    CBC Recent Labs  Lab 09/30/19 1653 09/30/19 1707 10/01/19 0347 10/02/19 0730 10/04/19 0526  WBC 7.6  --  9.1 6.8 7.8  HGB 13.6 13.6 12.9 12.9 13.4  HCT 41.8 40.0 39.3 39.2 40.3  PLT 253  --  208 216 208  MCV 97.4  --  97.0 96.3 95.3  MCH 31.7  --  31.9 31.7 31.7  MCHC 32.5  --  32.8 32.9 33.3  RDW 13.4  --  13.2 13.2 13.0  LYMPHSABS 2.2  --   --   --   --   MONOABS 0.5  --   --   --   --   EOSABS 0.3  --   --   --   --   BASOSABS 0.0  --   --   --   --  Chemistries  Recent Labs  Lab 09/30/19 1653 09/30/19 1653 09/30/19 1707 10/01/19 0347 10/02/19 0730 10/04/19 0526 10/05/19 0351  NA 141   < > 140 142 142 144 143  K 4.8   < > 5.4* 3.3* 3.3* 3.1* 3.4*  CL 108   < > 105 106 107 109 107  CO2 24  --   --  23 26 26 28   GLUCOSE 113*   < > 108* 116* 107* 108* 110*  BUN 14   < > 21 11 13 14 14   CREATININE 0.84   < > 0.80 0.89 0.89 0.89 0.96  CALCIUM 9.0  --   --  9.1 9.0 9.3 9.2  AST 35  --   --  19  --   --   --   ALT 15  --   --  20  --   --   --   ALKPHOS 69  --   --  78  --   --   --   BILITOT 1.7*  --   --  0.9  --   --   --    < > = values in this interval not displayed.    ------------------------------------------------------------------------------------------------------------------ No results for input(s): CHOL, HDL, LDLCALC, TRIG, CHOLHDL, LDLDIRECT in the last 72 hours.  Lab Results  Component Value Date   HGBA1C 5.6 10/01/2019   ------------------------------------------------------------------------------------------------------------------ No results for input(s): TSH, T4TOTAL, T3FREE, THYROIDAB in the last 72 hours.  Invalid input(s): FREET3 ------------------------------------------------------------------------------------------------------------------ No results for input(s): VITAMINB12, FOLATE, FERRITIN, TIBC, IRON, RETICCTPCT in the last 72 hours.  Coagulation profile No results for input(s): INR, PROTIME in the last 168 hours.  No results for input(s): DDIMER in the last 72 hours.  Cardiac Enzymes No results for input(s): CKMB, TROPONINI, MYOGLOBIN in the last 168 hours.  Invalid input(s): CK ------------------------------------------------------------------------------------------------------------------ No results found for: BNP  Inpatient Medications  Scheduled Meds: . amLODipine  10 mg Oral Daily  . amLODipine  5 mg Oral Once  . aspirin EC  81 mg Oral Daily  . atorvastatin  40 mg Oral Daily  . baclofen  5 mg Oral QHS  . cholestyramine light  4 g Oral QAC breakfast  . clopidogrel  75 mg Oral Daily  . heparin injection (subcutaneous)  5,000 Units Subcutaneous Q8H  . mirabegron ER  50 mg Oral Daily  . pantoprazole  40 mg Oral Daily  . sodium chloride flush  3 mL Intravenous Once   Continuous Infusions:  PRN Meds:.acetaminophen **OR** acetaminophen (TYLENOL) oral liquid 160 mg/5 mL **OR** acetaminophen, ondansetron (ZOFRAN) IV, Resource ThickenUp Clear, senna-docusate  Micro Results Recent Results (from the past 240 hour(s))  Respiratory Panel by RT PCR (Flu A&B, Covid) - Nasopharyngeal Swab     Status: None    Collection Time: 09/30/19  5:32 PM   Specimen: Nasopharyngeal Swab  Result Value Ref Range Status   SARS Coronavirus 2 by RT PCR NEGATIVE NEGATIVE Final    Comment: (NOTE) SARS-CoV-2 target nucleic acids are NOT DETECTED. The SARS-CoV-2 RNA is generally detectable in upper respiratoy specimens during the acute phase of infection. The lowest concentration of SARS-CoV-2 viral copies this assay can detect is 131 copies/mL. A negative result does not preclude SARS-Cov-2 infection and should not be used as the sole basis for treatment or other patient management decisions. A negative result may occur with  improper specimen collection/handling, submission of specimen other than nasopharyngeal swab, presence of viral mutation(s) within the areas targeted by this assay,  and inadequate number of viral copies (<131 copies/mL). A negative result must be combined with clinical observations, patient history, and epidemiological information. The expected result is Negative. Fact Sheet for Patients:  https://www.moore.com/ Fact Sheet for Healthcare Providers:  https://www.young.biz/ This test is not yet ap proved or cleared by the [place] FDA and  has been authorized for detection and/or diagnosis of SARS-CoV-2 by FDA under an Emergency Use Authorization (EUA). This EUA will remain  in effect (meaning this test can be used) for the duration of the COVID-19 declaration under Section 564(b)(1) of the Act, 21 U.S.C. section 360bbb-3(b)(1), unless the authorization is terminated or revoked sooner.    Influenza A by PCR NEGATIVE NEGATIVE Final   Influenza B by PCR NEGATIVE NEGATIVE Final    Comment: (NOTE) The Xpert Xpress SARS-CoV-2/FLU/RSV assay is intended as an aid in  the diagnosis of influenza from Nasopharyngeal swab specimens and  should not be used as a sole basis for treatment. Nasal washings and  aspirates are unacceptable for Xpert Xpress  SARS-CoV-2/FLU/RSV  testing. Fact Sheet for Patients: https://www.moore.com/ Fact Sheet for Healthcare Providers: https://www.young.biz/ This test is not yet approved or cleared by the [place] FDA and  has been authorized for detection and/or diagnosis of SARS-CoV-2 by  FDA under an Emergency Use Authorization (EUA). This EUA will remain  in effect (meaning this test can be used) for the duration of the  Covid-19 declaration under Section 564(b)(1) of the Act, 21  U.S.C. section 360bbb-3(b)(1), unless the authorization is  terminated or revoked. Performed at [facility], [address] [place], [place] [address]     Radiology Reports CT Code Stroke CTA Head W/WO contrast  Result Date: 09/30/2019 CLINICAL DATA:  Altered mental status.  Weakness. EXAM: CT ANGIOGRAPHY HEAD AND NECK TECHNIQUE: Multidetector CT imaging of the head and neck was performed using the standard protocol during bolus administration of intravenous contrast. Multiplanar CT image reconstructions and MIPs were obtained to evaluate the vascular anatomy. Carotid stenosis measurements (when applicable) are obtained utilizing NASCET criteria, using the distal internal carotid diameter as the denominator. CONTRAST:  60mL OMNIPAQUE IOHEXOL 350 MG/ML SOLN COMPARISON:  None. FINDINGS: CTA NECK FINDINGS Aortic arch: Standard 3 vessel aortic arch with minimal atherosclerotic plaque. Widely patent arch vessel origins. Right carotid system: Patent with a moderate amount of predominantly calcified plaque at the carotid bifurcation. No evidence of significant stenosis or dissection. Tortuous proximal common carotid artery. Left carotid system: Patent with extensive calcified plaque at the carotid bifurcation resulting in less than 50% narrowing of the ICA origin. Tortuous proximal common carotid artery. Vertebral arteries: Patent and codominant without evidence of stenosis or dissection.  Skeleton: Moderate diffuse cervical disc degeneration. Other neck: No evidence of cervical lymphadenopathy or mass. Upper chest: Clear lung apices. Review of the MIP images confirms the above findings CTA HEAD FINDINGS Anterior circulation: The internal carotid arteries are patent from skull base to carotid termini. Calcified plaque in the cavernous and proximal supraclinoid segments bilaterally does not result in significant stenosis. There is a punctate calcification at the left ICA terminus, favored to be chronic as it was likely faintly visible on a 2006 head CT. This likely results in moderate left MCA origin stenosis and potentially severe left ACA origin stenosis. The left A1 segment is hypoplastic, and the left ACA is primarily supplied from the right via the anterior communicating artery. The right A1 segment is widely patent. Both MCAs are patent without evidence of proximal branch  occlusion. There are mild left M1 and proximal right M2 stenoses. No aneurysm is identified. Posterior circulation: The intracranial vertebral arteries are patent to the basilar with calcified plaque resulting in mild proximal right V4 stenosis. Patent PICAs and SCAs are seen bilaterally. The basilar artery is patent and small in caliber congenitally. There are large posterior communicating arteries with hypoplastic right and absent left posterior communicating arteries. The PCAs are patent with branch vessel irregularity but no significant proximal stenosis. No aneurysm is identified. Venous sinuses: Poorly evaluated due to contrast timing. Anatomic variants: Predominantly fetal origin of the PCAs. Review of the MIP images confirms the above findings IMPRESSION: 1. No large vessel occlusion. 2. Suspected chronic calcification at the left ICA terminus resulting in ACA and MCA origin stenoses. 3. Mild distal right vertebral artery stenosis. 4. Left greater than right cervical carotid artery atherosclerosis without significant  stenosis. These results were communicated to Dr. Wilford Corner at 5:20 pm on 09/30/2019 by text page via the [facility] messaging system. Electronically Signed   By: Sebastian Ache M.D.   On: 09/30/2019 17:34   DG Chest 1 View  Result Date: 09/30/2019 CLINICAL DATA:  Slurred speech EXAM: CHEST  1 VIEW COMPARISON:  06/07/2012 FINDINGS: The heart size and mediastinal contours are within normal limits. Both lungs are clear. The visualized skeletal structures are unremarkable. IMPRESSION: No active disease. Electronically Signed   By: Jasmine Pang M.D.   On: 09/30/2019 22:51   CT Code Stroke CTA Neck W/WO contrast  Result Date: 09/30/2019 CLINICAL DATA:  Altered mental status.  Weakness. EXAM: CT ANGIOGRAPHY HEAD AND NECK TECHNIQUE: Multidetector CT imaging of the head and neck was performed using the standard protocol during bolus administration of intravenous contrast. Multiplanar CT image reconstructions and MIPs were obtained to evaluate the vascular anatomy. Carotid stenosis measurements (when applicable) are obtained utilizing NASCET criteria, using the distal internal carotid diameter as the denominator. CONTRAST:  77mL OMNIPAQUE IOHEXOL 350 MG/ML SOLN COMPARISON:  None. FINDINGS: CTA NECK FINDINGS Aortic arch: Standard 3 vessel aortic arch with minimal atherosclerotic plaque. Widely patent arch vessel origins. Right carotid system: Patent with a moderate amount of predominantly calcified plaque at the carotid bifurcation. No evidence of significant stenosis or dissection. Tortuous proximal common carotid artery. Left carotid system: Patent with extensive calcified plaque at the carotid bifurcation resulting in less than 50% narrowing of the ICA origin. Tortuous proximal common carotid artery. Vertebral arteries: Patent and codominant without evidence of stenosis or dissection. Skeleton: Moderate diffuse cervical disc degeneration. Other neck: No evidence of cervical lymphadenopathy or mass. Upper chest: Clear lung  apices. Review of the MIP images confirms the above findings CTA HEAD FINDINGS Anterior circulation: The internal carotid arteries are patent from skull base to carotid termini. Calcified plaque in the cavernous and proximal supraclinoid segments bilaterally does not result in significant stenosis. There is a punctate calcification at the left ICA terminus, favored to be chronic as it was likely faintly visible on a 2006 head CT. This likely results in moderate left MCA origin stenosis and potentially severe left ACA origin stenosis. The left A1 segment is hypoplastic, and the left ACA is primarily supplied from the right via the anterior communicating artery. The right A1 segment is widely patent. Both MCAs are patent without evidence of proximal branch occlusion. There are mild left M1 and proximal right M2 stenoses. No aneurysm is identified. Posterior circulation: The intracranial vertebral arteries are patent to the basilar with calcified plaque resulting in mild proximal right  V4 stenosis. Patent PICAs and SCAs are seen bilaterally. The basilar artery is patent and small in caliber congenitally. There are large posterior communicating arteries with hypoplastic right and absent left posterior communicating arteries. The PCAs are patent with branch vessel irregularity but no significant proximal stenosis. No aneurysm is identified. Venous sinuses: Poorly evaluated due to contrast timing. Anatomic variants: Predominantly fetal origin of the PCAs. Review of the MIP images confirms the above findings IMPRESSION: 1. No large vessel occlusion. 2. Suspected chronic calcification at the left ICA terminus resulting in ACA and MCA origin stenoses. 3. Mild distal right vertebral artery stenosis. 4. Left greater than right cervical carotid artery atherosclerosis without significant stenosis. These results were communicated to Dr. Wilford Corner at 5:20 pm on 09/30/2019 by text page via the [facility] messaging system. Electronically  Signed   By: Sebastian Ache M.D.   On: 09/30/2019 17:34   MR BRAIN WO CONTRAST  Result Date: 09/30/2019 CLINICAL DATA:  Possible stroke. EXAM: MRI HEAD WITHOUT CONTRAST TECHNIQUE: Multiplanar, multiecho pulse sequences of the brain and surrounding structures were obtained without intravenous contrast. COMPARISON:  Brain MRI 07/19/2005 FINDINGS: Brain: Small focus of abnormal diffusion restriction along the posteromedial left temporal lobe. No other diffusion abnormality. Diffuse confluent hyperintense T2-weighted signal within the periventricular, deep and juxtacortical white matter, most commonly due to chronic ischemic microangiopathy. There is generalized atrophy without lobar predilection. There are a few chronic microhemorrhages in a predominantly central distribution. Normal midline structures. Vascular: Normal flow voids. Skull and upper cervical spine: Normal marrow signal. Sinuses/Orbits: Negative. Other: None. IMPRESSION: 1. Small focus of abnormal diffusion restriction along the posteromedial left temporal lobe, consistent with a small acute infarct. 2. Severe chronic ischemic microangiopathy and generalized atrophy. Chronic micro 3. Chronic microhemorrhage within a predominantly central distribution, likely indicating chronic hypertensive angiopathy. Electronically Signed   By: Deatra Robinson M.D.   On: 09/30/2019 23:42   DG Swallowing Func-Speech Pathology  Result Date: 10/03/2019 Objective Swallowing Evaluation: Type of Study: MBS-Modified Barium Swallow Study  Patient Details Name: SHELLE GALDAMEZ MRN: [id number] Date of Birth: August 08, 1933 Today's Date: 10/03/2019 Time: SLP Start Time (ACUTE ONLY): 0900 -SLP Stop Time (ACUTE ONLY): 0920 SLP Time Calculation (min) (ACUTE ONLY): 20 min Past Medical History: Past Medical History: Diagnosis Date . Depression  . GERD (gastroesophageal reflux disease)  . Hypertension  . Stroke (cerebrum) [facility])  Past Surgical History: Past Surgical History: Procedure  Laterality Date . ABDOMINAL HYSTERECTOMY   . GALLBLADDER SURGERY   HPI: Pt is 86 y.o. female with medical history of stroke with residual right-sided weakness, HTN, GERD, depression who presented with slurred speech and altered mental status. MRI revealed small abnormal diffusion restriction along the posteromedial L temporal lobe consistent with small acute infarct. CXR no active disease.  No data recorded Assessment / Plan / Recommendation CHL IP CLINICAL IMPRESSIONS 10/03/2019 Clinical Impression Pt exhibited mild pharyngeal dysphagia suspected to be mostly chronic possibly exacerbated by acute stroke. She reports intermittent coughing with liquids "for years". There was min-mild vallecular and pyriform sinus residue possibly due to inefficient pharyngeal contraction (?cervical rigidity- cervical calcificaiton observed). Thin barium residue from lateral channels and pyriform sinuses spilled into her laryngeal vestibule after the swallow and fell below the vocal cords eliciting reflexive cough however did not fully clear as barium seen in trachea. Supraglottic breath hold strategy was not effective. Chin tuck would thought to be counterintuitive given pyriform sinues residue however it increased coordination and protection 90% of the time. Esophageal scan did  not reveal overt abnormalities (MBS does not diagnose below the level of the UES). Recommend regular texture, thin liquids (chin tuck with liquids only), meds whole in puree, throat clear during meals.      SLP Visit Diagnosis Dysphagia, pharyngeal phase (R13.13) Attention and concentration deficit following -- Frontal lobe and executive function deficit following -- Impact on safety and function --   CHL IP TREATMENT RECOMMENDATION 10/03/2019 Treatment Recommendations Therapy as outlined in treatment plan below   Prognosis 10/03/2019 Prognosis for Safe Diet Advancement Good Barriers to Reach Goals -- Barriers/Prognosis Comment -- CHL IP DIET RECOMMENDATION  10/03/2019 SLP Diet Recommendations Regular solids;Thin liquid Liquid Administration via Cup;No straw Medication Administration Whole meds with puree Compensations Slow rate;Small sips/bites;Chin tuck;Other (Comment) Postural Changes Seated upright at 90 degrees;Remain semi-upright after after feeds/meals (Comment)   CHL IP OTHER RECOMMENDATIONS 10/03/2019 Recommended Consults -- Oral Care Recommendations Oral care BID Other Recommendations --   CHL IP FOLLOW UP RECOMMENDATIONS 10/03/2019 Follow up Recommendations None   CHL IP FREQUENCY AND DURATION 10/03/2019 Speech Therapy Frequency (ACUTE ONLY) min 2x/week Treatment Duration 2 weeks      CHL IP ORAL PHASE 10/03/2019 Oral Phase WFL Oral - Pudding Teaspoon -- Oral - Pudding Cup -- Oral - Honey Teaspoon -- Oral - Honey Cup -- Oral - Nectar Teaspoon -- Oral - Nectar Cup -- Oral - Nectar Straw -- Oral - Thin Teaspoon -- Oral - Thin Cup -- Oral - Thin Straw -- Oral - Puree -- Oral - Mech Soft -- Oral - Regular -- Oral - Multi-Consistency -- Oral - Pill -- Oral Phase - Comment --  CHL IP PHARYNGEAL PHASE 10/03/2019 Pharyngeal Phase Impaired Pharyngeal- Pudding Teaspoon -- Pharyngeal -- Pharyngeal- Pudding Cup -- Pharyngeal -- Pharyngeal- Honey Teaspoon -- Pharyngeal -- Pharyngeal- Honey Cup -- Pharyngeal -- Pharyngeal- Nectar Teaspoon -- Pharyngeal -- Pharyngeal- Nectar Cup WFL Pharyngeal -- Pharyngeal- Nectar Straw -- Pharyngeal -- Pharyngeal- Thin Teaspoon -- Pharyngeal -- Pharyngeal- Thin Cup Pharyngeal residue - valleculae;Penetration/Aspiration during swallow Pharyngeal Material enters airway, remains ABOVE vocal cords then ejected out Pharyngeal- Thin Straw Pharyngeal residue - valleculae;Pharyngeal residue - pyriform;Penetration/Aspiration during swallow;Penetration/Apiration after swallow Pharyngeal Material enters airway, passes BELOW cords and not ejected out despite cough attempt by patient Pharyngeal- Puree -- Pharyngeal -- Pharyngeal- Mechanical Soft --  Pharyngeal -- Pharyngeal- Regular WFL Pharyngeal -- Pharyngeal- Multi-consistency -- Pharyngeal -- Pharyngeal- Pill -- Pharyngeal -- Pharyngeal Comment --  CHL IP CERVICAL ESOPHAGEAL PHASE 10/03/2019 Cervical Esophageal Phase WFL Pudding Teaspoon -- Pudding Cup -- Honey Teaspoon -- Honey Cup -- Nectar Teaspoon -- Nectar Cup -- Nectar Straw -- Thin Teaspoon -- Thin Cup -- Thin Straw -- Puree -- Mechanical Soft -- Regular -- Multi-consistency -- Pill -- Cervical Esophageal Comment -- Royce Macadamia 10/03/2019, 1:46 PM  Breck Coons [employer] administrator Pager [phone] Office [phone]             ECHOCARDIOGRAM COMPLETE BUBBLE STUDY  Result Date: 10/01/2019    ECHOCARDIOGRAM REPORT   Patient Name:   AMAURI MEDELLIN [facility] Date of Exam: 10/01/2019 Medical Rec #:  [id number]         Height:       62.0 in Accession #:    [id number]        Weight:       153.2 lb Date of Birth:  10-01-1933         BSA:          1.707 m Patient Age:  85 years          BP:           169/72 mmHg Patient Gender: F                 HR:           63 bpm. Exam Location:  Inpatient Procedure: 2D Echo and Saline Contrast Bubble Study Indications:    Stroke  History:        Patient has no prior history of Echocardiogram examinations.                 Risk Factors:Hypertension.  Sonographer:    Thurman Coyerasey Kirkpatrick RDCS (AE) Referring Phys: [id number] NA LI IMPRESSIONS  1. Left ventricular ejection fraction, by estimation, is 70 to 75%. The left ventricle has hyperdynamic function. The left ventricle has no regional wall motion abnormalities. Left ventricular diastolic parameters are consistent with Grade I diastolic dysfunction (impaired relaxation).  2. Right ventricular systolic function is normal. The right ventricular size is normal. Tricuspid regurgitation signal is inadequate for assessing PA pressure.  3. The mitral valve is grossly normal. No evidence of mitral valve regurgitation. No evidence of mitral stenosis.  4. The  aortic valve is tricuspid. Aortic valve regurgitation is not visualized. No aortic stenosis is present.  5. The inferior vena cava is normal in size with greater than 50% respiratory variability, suggesting right atrial pressure of 3 mmHg.  6. Agitated saline contrast bubble study was negative, with no evidence of any interatrial shunt. Conclusion(s)/Recommendation(s): No intracardiac source of embolism detected on this transthoracic study. A transesophageal echocardiogram is recommended to exclude cardiac source of embolism if clinically indicated. FINDINGS  Left Ventricle: Left ventricular ejection fraction, by estimation, is 70 to 75%. The left ventricle has hyperdynamic function. The left ventricle has no regional wall motion abnormalities. The left ventricular internal cavity size was normal in size. There is no left ventricular hypertrophy. Left ventricular diastolic parameters are consistent with Grade I diastolic dysfunction (impaired relaxation). Right Ventricle: The right ventricular size is normal. No increase in right ventricular wall thickness. Right ventricular systolic function is normal. Tricuspid regurgitation signal is inadequate for assessing PA pressure. Left Atrium: Left atrial size was normal in size. Right Atrium: Right atrial size was normal in size. Pericardium: Trivial pericardial effusion is present. Presence of pericardial fat pad. Mitral Valve: The mitral valve is grossly normal. No evidence of mitral valve regurgitation. No evidence of mitral valve stenosis. Tricuspid Valve: The tricuspid valve is grossly normal. Tricuspid valve regurgitation is not demonstrated. No evidence of tricuspid stenosis. Aortic Valve: The aortic valve is tricuspid. Aortic valve regurgitation is not visualized. No aortic stenosis is present. Pulmonic Valve: The pulmonic valve was grossly normal. Pulmonic valve regurgitation is not visualized. No evidence of pulmonic stenosis. Aorta: The aortic root and  ascending aorta are structurally normal, with no evidence of dilitation. Venous: The inferior vena cava is normal in size with greater than 50% respiratory variability, suggesting right atrial pressure of 3 mmHg. IAS/Shunts: The atrial septum is grossly normal. Agitated saline contrast was given intravenously to evaluate for intracardiac shunting. Agitated saline contrast bubble study was negative, with no evidence of any interatrial shunt.  LEFT VENTRICLE PLAX 2D LVIDd:         3.60 cm  Diastology LVIDs:         2.40 cm  LV e' lateral:   6.22 cm/s LV PW:         1.00 cm  LV E/e' lateral: 10.0 LV IVS:        1.00 cm  LV e' medial:    5.33 cm/s LVOT diam:     1.80 cm  LV E/e' medial:  11.7 LV SV:         68 LV SV Index:   40 LVOT Area:     2.54 cm  RIGHT VENTRICLE RV S prime:     16.60 cm/s TAPSE (M-mode): 2.1 cm LEFT ATRIUM             Index       RIGHT ATRIUM           Index LA diam:        2.30 cm 1.35 cm/m  RA Area:     10.10 cm LA Vol (A2C):   46.1 ml 27.01 ml/m RA Volume:   18.20 ml  10.66 ml/m LA Vol (A4C):   44.2 ml 25.89 ml/m LA Biplane Vol: 45.1 ml 26.42 ml/m  AORTIC VALVE LVOT Vmax:   124.00 cm/s LVOT Vmean:  96.900 cm/s LVOT VTI:    0.268 m  AORTA Ao Root diam: 2.60 cm MITRAL VALVE MV Area (PHT): 1.65 cm     SHUNTS MV Decel Time: 461 msec     Systemic VTI:  0.27 m MV E velocity: 62.10 cm/s   Systemic Diam: 1.80 cm MV A velocity: 101.00 cm/s MV E/A ratio:  0.61 Lennie Odor MD Electronically signed by Lennie Odor MD Signature Date/Time: 10/01/2019/3:47:30 PM    Final    CT HEAD CODE STROKE WO CONTRAST  Result Date: 09/30/2019 CLINICAL DATA:  Code stroke. Possible stroke, ataxia, stroke suspected. Additional history provided: Last known well 1300 EXAM: CT HEAD WITHOUT CONTRAST TECHNIQUE: Contiguous axial images were obtained from the base of the skull through the vertex without intravenous contrast. COMPARISON:  Brain MRI 07/19/2005, brain MRI 08/01/2004, head CT 07/31/2004 FINDINGS: Brain:  There is no evidence of acute intracranial hemorrhage. Ischemic changes within the left lentiform nucleus, progressed as compared to MRI 07/19/2005, but otherwise age indeterminate. Redemonstrated chronic lacunar infarct within the left corona radiata. Background advanced chronic small vessel ischemic disease and moderate generalized parenchymal atrophy, also progressed. There is no evidence of intracranial mass. No midline shift or extra-axial fluid collection. Vascular: No hyperdense vessel.  Atherosclerotic calcifications. Skull: Normal. Negative for fracture or focal lesion. Sinuses/Orbits: Visualized orbits demonstrate no acute abnormality. No significant paranasal sinus disease or mastoid effusion at the imaged levels. ASPECTS ([place] Stroke Program Early CT Score) - Ganglionic level infarction (caudate, lentiform nuclei, internal capsule, insula, M1-M3 cortex): 6 (point subtracted for age-indeterminate ischemic changes within the left lentiform nucleus) - Supraganglionic infarction (M4-M6 cortex): 3 Total score (0-10 with 10 being normal): 9 These results were called by telephone at the time of interpretation on 09/30/2019 at 5:14 pm to provider Dr. Wilford Corner, who verbally acknowledged these results. IMPRESSION: 1. Age-indeterminate ischemic changes within the left lentiform nucleus. Aspects 9. 2. No evidence of acute intracranial hemorrhage or acute demarcated cortical infarct. 3. Advanced chronic small vessel ischemic disease has progressed as compared to prior MRI 07/19/2005. Redemonstrated chronic lacunar infarct within the left corona radiata. 4. Moderate generalized parenchymal atrophy. Electronically Signed   By: Jackey Loge DO   On: 09/30/2019 17:17     Avin Gibbons M.D on 10/05/2019 at 12:03 PM  Between 7am to 7pm - Pager - [phone]  After 7pm go to [email or web address] - password [facility]  [facility] -  Office  [phone]

## 2019-10-06 DIAGNOSIS — E559 Vitamin D deficiency, unspecified: Secondary | ICD-10-CM | POA: Diagnosis not present

## 2019-10-06 DIAGNOSIS — R2681 Unsteadiness on feet: Secondary | ICD-10-CM | POA: Diagnosis not present

## 2019-10-06 DIAGNOSIS — N3281 Overactive bladder: Secondary | ICD-10-CM | POA: Diagnosis not present

## 2019-10-06 DIAGNOSIS — D649 Anemia, unspecified: Secondary | ICD-10-CM | POA: Diagnosis not present

## 2019-10-06 DIAGNOSIS — Z8673 Personal history of transient ischemic attack (TIA), and cerebral infarction without residual deficits: Secondary | ICD-10-CM | POA: Diagnosis not present

## 2019-10-06 DIAGNOSIS — I69351 Hemiplegia and hemiparesis following cerebral infarction affecting right dominant side: Secondary | ICD-10-CM | POA: Diagnosis not present

## 2019-10-06 DIAGNOSIS — M24541 Contracture, right hand: Secondary | ICD-10-CM | POA: Diagnosis not present

## 2019-10-06 DIAGNOSIS — I161 Hypertensive emergency: Secondary | ICD-10-CM | POA: Diagnosis not present

## 2019-10-06 DIAGNOSIS — F329 Major depressive disorder, single episode, unspecified: Secondary | ICD-10-CM | POA: Diagnosis not present

## 2019-10-06 DIAGNOSIS — R41841 Cognitive communication deficit: Secondary | ICD-10-CM | POA: Diagnosis not present

## 2019-10-06 DIAGNOSIS — I63332 Cerebral infarction due to thrombosis of left posterior cerebral artery: Secondary | ICD-10-CM | POA: Diagnosis not present

## 2019-10-06 DIAGNOSIS — K219 Gastro-esophageal reflux disease without esophagitis: Secondary | ICD-10-CM | POA: Diagnosis not present

## 2019-10-06 DIAGNOSIS — I69328 Other speech and language deficits following cerebral infarction: Secondary | ICD-10-CM | POA: Diagnosis not present

## 2019-10-06 DIAGNOSIS — E785 Hyperlipidemia, unspecified: Secondary | ICD-10-CM | POA: Diagnosis not present

## 2019-10-06 DIAGNOSIS — E876 Hypokalemia: Secondary | ICD-10-CM | POA: Diagnosis not present

## 2019-10-06 DIAGNOSIS — I10 Essential (primary) hypertension: Secondary | ICD-10-CM | POA: Diagnosis not present

## 2019-10-06 LAB — BASIC METABOLIC PANEL
Anion gap: 8 (ref 5–15)
BUN: 17 mg/dL (ref 8–23)
CO2: 25 mmol/L (ref 22–32)
Calcium: 8.9 mg/dL (ref 8.9–10.3)
Chloride: 108 mmol/L (ref 98–111)
Creatinine, Ser: 0.95 mg/dL (ref 0.44–1.00)
GFR calc Af Amer: >60 mL/min (ref >60)
GFR calc non Af Amer: 55 mL/min — ABNORMAL LOW (ref >60)
Glucose, Bld: 100 mg/dL — ABNORMAL HIGH (ref 70–99)
Potassium: 3.5 mmol/L (ref 3.5–5.1)
Sodium: 141 mmol/L (ref 135–145)

## 2019-10-06 LAB — GLUCOSE, CAPILLARY
Glucose-Capillary: 125 mg/dL — ABNORMAL HIGH (ref 70–99)
Glucose-Capillary: 95 mg/dL (ref 70–99)

## 2019-10-06 MED ORDER — ASPIRIN 81 MG PO TBEC: 81.0000 mg | DELAYED_RELEASE_TABLET | Freq: Every day | ORAL | Status: AC

## 2019-10-06 MED ORDER — HYDRALAZINE HCL 10 MG PO TABS: 10.0000 mg | ORAL_TABLET | Freq: Four times a day (QID) | ORAL | Status: DC

## 2019-10-06 MED ORDER — AMLODIPINE BESYLATE 10 MG PO TABS: 10.0000 mg | ORAL_TABLET | Freq: Every day | ORAL | Status: DC

## 2019-10-06 MED ORDER — CLOPIDOGREL BISULFATE 75 MG PO TABS: 75.0000 mg | ORAL_TABLET | Freq: Every day | ORAL | 0 refills | Status: DC

## 2019-10-06 MED ORDER — ATORVASTATIN CALCIUM 40 MG PO TABS: 40.0000 mg | ORAL_TABLET | Freq: Every day | ORAL | Status: DC

## 2019-10-06 MED ORDER — PANTOPRAZOLE SODIUM 40 MG PO TBEC: 40.0000 mg | DELAYED_RELEASE_TABLET | Freq: Every day | ORAL | 0 refills | Status: DC

## 2019-10-06 NOTE — Progress Notes (Signed)
Pt declined OOB citing fatigue having been awake since 3 AM. Agreeable to sitting EOB and completed grooming and UB dressing with min assist. Pt continues to appropriate for ST rehab in SNF.   10/06/19 1300  OT Visit Information  Last OT Received On 10/06/19  Assistance Needed +1  History of Present Illness Pt is 86 y.o. female with medical history significant of stroke with residual right-sided weakness, HTN, GERD, depression who presented with slurred speech and altered mental status.  Pt admitted with HTN and CVA.  MRI revealed small abnormal diffusion restriction along the posteromedial L temporal lobe consistent with small acute infarct.  Precautions  Precautions Fall  Pain Assessment  Pain Assessment No/denies pain  Cognition  Arousal/Alertness Awake/alert  Behavior During Therapy WFL for tasks assessed/performed  Overall Cognitive Status Within Functional Limits for tasks assessed  Upper Extremity Assessment  RUE Deficits / Details palm protector working well for pt, recommended pt have her splint delivered to SNF when she gets there  ADL  Overall ADL's  Needs assistance/impaired  Grooming Brushing hair;Oral care;Wash/dry hands;Wash/dry face;Sitting;Minimal assistance  Grooming Details (indicate cue type and reason) at EOB  Upper Body Dressing  Minimal assistance;Sitting  Upper Body Dressing Details (indicate cue type and reason) to change soiled gown  Bed Mobility  Overal bed mobility Needs Assistance  Supine to sit Supervision  General bed mobility comments increased time, HOB up  Balance  Overall balance assessment Needs assistance  Sitting balance-Leahy Scale Good  Transfers  General transfer comment declined OOB due to fatigue  OT - End of Session  Activity Tolerance Patient tolerated treatment well  Patient left in bed;with call bell/phone within reach;with bed alarm set  OT Assessment/Plan  OT Plan Discharge plan remains appropriate  OT Visit Diagnosis Unsteadiness  on feet (R26.81);Muscle weakness (generalized) (M62.81);Hemiplegia and hemiparesis  Hemiplegia - Right/Left Right  Hemiplegia - dominant/non-dominant Dominant  Hemiplegia - caused by Cerebral infarction  OT Frequency (ACUTE ONLY) Min 2X/week  Follow Up Recommendations SNF;Supervision/Assistance - 24 hour  OT Equipment Other (comment) (defer to SNF)  AM-PAC OT "6 Clicks" Daily Activity Outcome Measure (Version 2)  Help from another person eating meals? 3  Help from another person taking care of personal grooming? 3  Help from another person toileting, which includes using toliet, bedpan, or urinal? 2  Help from another person bathing (including washing, rinsing, drying)? 3  Help from another person to put on and taking off regular upper body clothing? 3  Help from another person to put on and taking off regular lower body clothing? 3  6 Click Score 17  OT Goal Progression  Progress towards OT goals Progressing toward goals  Acute Rehab OT Goals  Patient Stated Goal pt is agreeable to SNF for ST rehab  OT Goal Formulation With patient  Time For Goal Achievement 10/16/19  Potential to Achieve Goals Good  OT Time Calculation  OT Start Time (ACUTE ONLY) 0950  OT Stop Time (ACUTE ONLY) 1002  OT Time Calculation (min) 12 min  OT General Charges  $OT Visit 1 Visit  OT Treatments  $Self Care/Home Management  8-22 mins  Martie Round, OTR/L Acute Rehabilitation Services Pager: [phone] Office: [phone]

## 2019-10-06 NOTE — Discharge Summary (Signed)
CHERYLYN Adams, is a 86 y.o. female  DOB [birth year]  MRN [id number].  Admission date:  09/30/2019  Admitting Physician  Starleen Arms, MD  Discharge Date:  10/06/2019   Primary MD  Jerl Mina, MD  Recommendations for primary care physician for things to follow:  -Check CBC, CMP in 3 days.  CODE STATUS: DNR  Admission Diagnosis  Slurred speech [R47.81] Hypertensive emergency without congestive heart failure [I16.1] Hypertensive emergency [I16.1] Cerebrovascular accident (CVA) due to thrombosis of left posterior cerebral artery (HCC) [I63.332]   Discharge Diagnosis  Slurred speech [R47.81] Hypertensive emergency without congestive heart failure [I16.1] Hypertensive emergency [I16.1] Cerebrovascular accident (CVA) due to thrombosis of left posterior cerebral artery (HCC) [I63.332]    Active Problems:   Hypertensive emergency   History of stroke in adulthood   Essential hypertension   GERD (gastroesophageal reflux disease)   Depression   Cerebrovascular accident (CVA) due to thrombosis of left posterior cerebral artery (HCC)      Past Medical History:  Diagnosis Date  . Depression   . GERD (gastroesophageal reflux disease)   . Hypertension   . Stroke (cerebrum) [facility])     Past Surgical History:  Procedure Laterality Date  . ABDOMINAL HYSTERECTOMY    . GALLBLADDER SURGERY         History of present illness and  Hospital Course:     Kindly see H&P for history of present illness and admission details, please review complete Labs, Consult reports and Test reports for all details in brief  HPI  from the history and physical done on the day of admission 09/30/2019  HPI: Alyssa Adams is a 86 y.o. female with medical history significant of stroke with residual right-sided weakness, HTN, GERD, depression who presented with slurred speech and altered mental status.  Patient has  some short-term memory deficits and H&P is obtained by chart review, talk with the patient at bedside and her daughter over the phone.  Patient lives at home by herself and a caregiver comes during the day and her daughter stays with her at nighttime.  Patient was in her usual state of health until around 11-12 noon today.  She started to have slurred speech and confusion.  Associations included headaches.  She denies increased weakness during my interview.  Denies blurry vision, seizure activities, syncope. When her daughter came home to check on her this afternoon, she noted above symptoms and called the EMS.  Patient was brought to ED for further evaluation.  In the emergency room, her BP Max 196/80.  Labs showed potassium 4.8, total bili 1.7, nonrevealing CBC.  EKG showed a sinus rhythm with no acute changes.  Head CT showed Age-indeterminate ischemic changes within the left lentiform nucleus.  CTA head and neck showed No large vessel occlusion.  Neurology was consulted at ED.  Denies fevers, chills, chest pain, shortness of breath, cough, wheezing, nausea, vomiting, diarrhea, abdominal pain, dysuria, urinary frequency or urgency.   Hospital Course   Acute CVA -Patient presents with slurred  speech, and altered mental status, this appears to be improving, her MRI significant for acute CVA in left temporal region. -Head and neck with no evidence of large vessel occlusion, but with chronic calcification in the left ICA and ACA and MCA origin stenosis, as well as mild distal right vertebral artery stenosis, neurology input greatly appreciated, current recommendation is for dual antiplatelet therapy aspirin and Plavix for 3 weeks, then aspirin alone. -2D echo with EF 70 to 75%. -PT/OT, initial recommendation for CIR versus SNF, after CIR evaluation and family discussion, plan for SNF placement, patient has bed available and she will be discharged today. -SLP consulted, status post modified barium  swallowing yesterday, currently advance to regular diet with thin liquid   Essential hypertension/hypertensive urgency -Blood pressure significantly elevated on presentation 196/80, given now confirmed acute CVA, initially on permissive hypertension, now blood pressure started to increase, will aim for gradual control, pressure remains elevated after increasing Norvasc to 10 mg oral daily, so she was started on hydralazine as well, and her blood pressure to be controlled on current regimen .  hypokalemia  -Repleted  History of CVA -With spastic right-sided hemiparesis,  .  GERD -Continue with PPI  Hyperlipidemia -LDL is 111, now on Lipitor.    Discharge Condition:  stable   Follow UP  Follow-up Information    [facility]. Schedule an appointment as soon as possible for a visit in 4 week(s).   Specialty: Neurology Contact information: [address] [place] [address] [phone]       Jerl Mina, MD Follow up in 2 week(s).   Specialty: Family Medicine Contact information: [address] [facility] [place] [address] [phone]             Discharge Instructions  and  Discharge Medications    Discharge Instructions    Ambulatory referral to Neurology   Complete by: As directed    Follow up with stroke clinic NP (Jessica Vanschaick or Darrol Angel, if both not available, consider Manson Allan, or Ahern) at [facility] in about 4 weeks. Thanks.   Discharge instructions   Complete by: As directed    Follow with Primary MD Jerl Mina, MD in 7 days   Get CBC, CMP,  checked  by Primary MD next visit.    Activity: As tolerated with Full fall precautions use walker/cane & assistance as needed   Disposition SNF   Diet: Heart Healthy , with feeding assistance and aspiration precautions.   On your next visit with your primary care physician please Get Medicines reviewed and  adjusted.   Please request your Prim.MD to go over all Hospital Tests and Procedure/Radiological results at the follow up, please get all Hospital records sent to your Prim MD by signing hospital release before you go home.   If you experience worsening of your admission symptoms, develop shortness of breath, life threatening emergency, suicidal or homicidal thoughts you must seek medical attention immediately by calling 911 or calling your MD immediately  if symptoms less severe.  You Must read complete instructions/literature along with all the possible adverse reactions/side effects for all the Medicines you take and that have been prescribed to you. Take any new Medicines after you have completely understood and accpet all the possible adverse reactions/side effects.   Do not drive, operating heavy machinery, perform activities at heights, swimming or participation in water activities or provide baby sitting services if your were admitted for syncope or siezures until  you have seen by Primary MD or a Neurologist and advised to do so again.  Do not drive when taking Pain medications.    Do not take more than prescribed Pain, Sleep and Anxiety Medications  Special Instructions: If you have smoked or chewed Tobacco  in the last 2 yrs please stop smoking, stop any regular Alcohol  and or any Recreational drug use.  Wear Seat belts while driving.   Please note  You were cared for by a hospitalist during your hospital stay. If you have any questions about your discharge medications or the care you received while you were in the hospital after you are discharged, you can call the unit and asked to speak with the hospitalist on call if the hospitalist that took care of you is not available. Once you are discharged, your primary care physician will handle any further medical issues. Please note that NO REFILLS for any discharge medications will be authorized once you are discharged, as it is  imperative that you return to your primary care physician (or establish a relationship with a primary care physician if you do not have one) for your aftercare needs so that they can reassess your need for medications and monitor your lab values.   Increase activity slowly   Complete by: As directed      Allergies as of 10/06/2019      Reactions   Erythromycin Ethylsuccinate Other (See Comments)   Reaction not recalled by the patient   Sulfa Antibiotics Other (See Comments)   Reaction not recalled by the patient      Medication List    STOP taking these medications   doxycycline 100 MG capsule Commonly known as: VIBRAMYCIN     TAKE these medications   amLODipine 10 MG tablet Commonly known as: NORVASC Take 1 tablet (10 mg total) by mouth daily. Start taking on: October 07, 2019   aspirin 81 MG EC tablet Take 1 tablet (81 mg total) by mouth daily. Start taking on: October 07, 2019   atorvastatin 40 MG tablet Commonly known as: LIPITOR Take 1 tablet (40 mg total) by mouth daily. Start taking on: October 07, 2019   benzonatate 100 MG capsule Commonly known as: LawyerTessalon Perles Take 1 capsule (100 mg total) by mouth 3 (three) times daily as needed.   cholestyramine light 4 g packet Commonly known as: PREVALITE Take 4 g by mouth daily before breakfast.   clopidogrel 75 MG tablet Commonly known as: PLAVIX Take 1 tablet (75 mg total) by mouth daily. Please take until 10/22/2019, then discontinue after that. Start taking on: Oct 22, 2019   hydrALAZINE 10 MG tablet Commonly known as: APRESOLINE Take 1 tablet (10 mg total) by mouth 4 (four) times daily.   mirabegron ER 50 MG Tb24 tablet Commonly known as: MYRBETRIQ Take 1 tablet (50 mg total) by mouth daily.   pantoprazole 40 MG tablet Commonly known as: PROTONIX Take 1 tablet (40 mg total) by mouth daily. Start taking on: October 07, 2019   Vitamin D (Ergocalciferol) 1.25 MG (50000 UNIT) Caps capsule Commonly known as:  DRISDOL Take 50,000 Units by mouth every 7 (seven) days.         Diet and Activity recommendation: See Discharge Instructions above   Consults obtained -  Neurology   Major procedures and Radiology Reports - PLEASE review detailed and final reports for all details, in brief -     CT Code Stroke CTA Head W/WO contrast  Result Date:  09/30/2019 CLINICAL DATA:  Altered mental status.  Weakness. EXAM: CT ANGIOGRAPHY HEAD AND NECK TECHNIQUE: Multidetector CT imaging of the head and neck was performed using the standard protocol during bolus administration of intravenous contrast. Multiplanar CT image reconstructions and MIPs were obtained to evaluate the vascular anatomy. Carotid stenosis measurements (when applicable) are obtained utilizing NASCET criteria, using the distal internal carotid diameter as the denominator. CONTRAST:  51mL OMNIPAQUE IOHEXOL 350 MG/ML SOLN COMPARISON:  None. FINDINGS: CTA NECK FINDINGS Aortic arch: Standard 3 vessel aortic arch with minimal atherosclerotic plaque. Widely patent arch vessel origins. Right carotid system: Patent with a moderate amount of predominantly calcified plaque at the carotid bifurcation. No evidence of significant stenosis or dissection. Tortuous proximal common carotid artery. Left carotid system: Patent with extensive calcified plaque at the carotid bifurcation resulting in less than 50% narrowing of the ICA origin. Tortuous proximal common carotid artery. Vertebral arteries: Patent and codominant without evidence of stenosis or dissection. Skeleton: Moderate diffuse cervical disc degeneration. Other neck: No evidence of cervical lymphadenopathy or mass. Upper chest: Clear lung apices. Review of the MIP images confirms the above findings CTA HEAD FINDINGS Anterior circulation: The internal carotid arteries are patent from skull base to carotid termini. Calcified plaque in the cavernous and proximal supraclinoid segments bilaterally does not result  in significant stenosis. There is a punctate calcification at the left ICA terminus, favored to be chronic as it was likely faintly visible on a 2006 head CT. This likely results in moderate left MCA origin stenosis and potentially severe left ACA origin stenosis. The left A1 segment is hypoplastic, and the left ACA is primarily supplied from the right via the anterior communicating artery. The right A1 segment is widely patent. Both MCAs are patent without evidence of proximal branch occlusion. There are mild left M1 and proximal right M2 stenoses. No aneurysm is identified. Posterior circulation: The intracranial vertebral arteries are patent to the basilar with calcified plaque resulting in mild proximal right V4 stenosis. Patent PICAs and SCAs are seen bilaterally. The basilar artery is patent and small in caliber congenitally. There are large posterior communicating arteries with hypoplastic right and absent left posterior communicating arteries. The PCAs are patent with branch vessel irregularity but no significant proximal stenosis. No aneurysm is identified. Venous sinuses: Poorly evaluated due to contrast timing. Anatomic variants: Predominantly fetal origin of the PCAs. Review of the MIP images confirms the above findings IMPRESSION: 1. No large vessel occlusion. 2. Suspected chronic calcification at the left ICA terminus resulting in ACA and MCA origin stenoses. 3. Mild distal right vertebral artery stenosis. 4. Left greater than right cervical carotid artery atherosclerosis without significant stenosis. These results were communicated to Dr. Wilford Corner at 5:20 pm on 09/30/2019 by text page via the [facility] messaging system. Electronically Signed   By: Sebastian Ache M.D.   On: 09/30/2019 17:34   DG Chest 1 View  Result Date: 09/30/2019 CLINICAL DATA:  Slurred speech EXAM: CHEST  1 VIEW COMPARISON:  06/07/2012 FINDINGS: The heart size and mediastinal contours are within normal limits. Both lungs are clear. The  visualized skeletal structures are unremarkable. IMPRESSION: No active disease. Electronically Signed   By: Jasmine Pang M.D.   On: 09/30/2019 22:51   CT Code Stroke CTA Neck W/WO contrast  Result Date: 09/30/2019 CLINICAL DATA:  Altered mental status.  Weakness. EXAM: CT ANGIOGRAPHY HEAD AND NECK TECHNIQUE: Multidetector CT imaging of the head and neck was performed using the standard protocol during bolus administration of  intravenous contrast. Multiplanar CT image reconstructions and MIPs were obtained to evaluate the vascular anatomy. Carotid stenosis measurements (when applicable) are obtained utilizing NASCET criteria, using the distal internal carotid diameter as the denominator. CONTRAST:  60mL OMNIPAQUE IOHEXOL 350 MG/ML SOLN COMPARISON:  None. FINDINGS: CTA NECK FINDINGS Aortic arch: Standard 3 vessel aortic arch with minimal atherosclerotic plaque. Widely patent arch vessel origins. Right carotid system: Patent with a moderate amount of predominantly calcified plaque at the carotid bifurcation. No evidence of significant stenosis or dissection. Tortuous proximal common carotid artery. Left carotid system: Patent with extensive calcified plaque at the carotid bifurcation resulting in less than 50% narrowing of the ICA origin. Tortuous proximal common carotid artery. Vertebral arteries: Patent and codominant without evidence of stenosis or dissection. Skeleton: Moderate diffuse cervical disc degeneration. Other neck: No evidence of cervical lymphadenopathy or mass. Upper chest: Clear lung apices. Review of the MIP images confirms the above findings CTA HEAD FINDINGS Anterior circulation: The internal carotid arteries are patent from skull base to carotid termini. Calcified plaque in the cavernous and proximal supraclinoid segments bilaterally does not result in significant stenosis. There is a punctate calcification at the left ICA terminus, favored to be chronic as it was likely faintly visible on a  2006 head CT. This likely results in moderate left MCA origin stenosis and potentially severe left ACA origin stenosis. The left A1 segment is hypoplastic, and the left ACA is primarily supplied from the right via the anterior communicating artery. The right A1 segment is widely patent. Both MCAs are patent without evidence of proximal branch occlusion. There are mild left M1 and proximal right M2 stenoses. No aneurysm is identified. Posterior circulation: The intracranial vertebral arteries are patent to the basilar with calcified plaque resulting in mild proximal right V4 stenosis. Patent PICAs and SCAs are seen bilaterally. The basilar artery is patent and small in caliber congenitally. There are large posterior communicating arteries with hypoplastic right and absent left posterior communicating arteries. The PCAs are patent with branch vessel irregularity but no significant proximal stenosis. No aneurysm is identified. Venous sinuses: Poorly evaluated due to contrast timing. Anatomic variants: Predominantly fetal origin of the PCAs. Review of the MIP images confirms the above findings IMPRESSION: 1. No large vessel occlusion. 2. Suspected chronic calcification at the left ICA terminus resulting in ACA and MCA origin stenoses. 3. Mild distal right vertebral artery stenosis. 4. Left greater than right cervical carotid artery atherosclerosis without significant stenosis. These results were communicated to Dr. Wilford Corner at 5:20 pm on 09/30/2019 by text page via the [facility] messaging system. Electronically Signed   By: Sebastian Ache M.D.   On: 09/30/2019 17:34   MR BRAIN WO CONTRAST  Result Date: 09/30/2019 CLINICAL DATA:  Possible stroke. EXAM: MRI HEAD WITHOUT CONTRAST TECHNIQUE: Multiplanar, multiecho pulse sequences of the brain and surrounding structures were obtained without intravenous contrast. COMPARISON:  Brain MRI 07/19/2005 FINDINGS: Brain: Small focus of abnormal diffusion restriction along the  posteromedial left temporal lobe. No other diffusion abnormality. Diffuse confluent hyperintense T2-weighted signal within the periventricular, deep and juxtacortical white matter, most commonly due to chronic ischemic microangiopathy. There is generalized atrophy without lobar predilection. There are a few chronic microhemorrhages in a predominantly central distribution. Normal midline structures. Vascular: Normal flow voids. Skull and upper cervical spine: Normal marrow signal. Sinuses/Orbits: Negative. Other: None. IMPRESSION: 1. Small focus of abnormal diffusion restriction along the posteromedial left temporal lobe, consistent with a small acute infarct. 2. Severe chronic ischemic microangiopathy and  generalized atrophy. Chronic micro 3. Chronic microhemorrhage within a predominantly central distribution, likely indicating chronic hypertensive angiopathy. Electronically Signed   By: Deatra Robinson M.D.   On: 09/30/2019 23:42   DG Swallowing Func-Speech Pathology  Result Date: 10/03/2019 Objective Swallowing Evaluation: Type of Study: MBS-Modified Barium Swallow Study  Patient Details Name: Alyssa Adams MRN: [id number] Date of Birth: Nov 08, 1933 Today's Date: 10/03/2019 Time: SLP Start Time (ACUTE ONLY): 0900 -SLP Stop Time (ACUTE ONLY): 0920 SLP Time Calculation (min) (ACUTE ONLY): 20 min Past Medical History: Past Medical History: Diagnosis Date . Depression  . GERD (gastroesophageal reflux disease)  . Hypertension  . Stroke (cerebrum) [facility])  Past Surgical History: Past Surgical History: Procedure Laterality Date . ABDOMINAL HYSTERECTOMY   . GALLBLADDER SURGERY   HPI: Pt is 86 y.o. female with medical history of stroke with residual right-sided weakness, HTN, GERD, depression who presented with slurred speech and altered mental status. MRI revealed small abnormal diffusion restriction along the posteromedial L temporal lobe consistent with small acute infarct. CXR no active disease.  No data recorded  Assessment / Plan / Recommendation CHL IP CLINICAL IMPRESSIONS 10/03/2019 Clinical Impression Pt exhibited mild pharyngeal dysphagia suspected to be mostly chronic possibly exacerbated by acute stroke. She reports intermittent coughing with liquids "for years". There was min-mild vallecular and pyriform sinus residue possibly due to inefficient pharyngeal contraction (?cervical rigidity- cervical calcificaiton observed). Thin barium residue from lateral channels and pyriform sinuses spilled into her laryngeal vestibule after the swallow and fell below the vocal cords eliciting reflexive cough however did not fully clear as barium seen in trachea. Supraglottic breath hold strategy was not effective. Chin tuck would thought to be counterintuitive given pyriform sinues residue however it increased coordination and protection 90% of the time. Esophageal scan did not reveal overt abnormalities (MBS does not diagnose below the level of the UES). Recommend regular texture, thin liquids (chin tuck with liquids only), meds whole in puree, throat clear during meals.      SLP Visit Diagnosis Dysphagia, pharyngeal phase (R13.13) Attention and concentration deficit following -- Frontal lobe and executive function deficit following -- Impact on safety and function --   CHL IP TREATMENT RECOMMENDATION 10/03/2019 Treatment Recommendations Therapy as outlined in treatment plan below   Prognosis 10/03/2019 Prognosis for Safe Diet Advancement Good Barriers to Reach Goals -- Barriers/Prognosis Comment -- CHL IP DIET RECOMMENDATION 10/03/2019 SLP Diet Recommendations Regular solids;Thin liquid Liquid Administration via Cup;No straw Medication Administration Whole meds with puree Compensations Slow rate;Small sips/bites;Chin tuck;Other (Comment) Postural Changes Seated upright at 90 degrees;Remain semi-upright after after feeds/meals (Comment)   CHL IP OTHER RECOMMENDATIONS 10/03/2019 Recommended Consults -- Oral Care Recommendations Oral  care BID Other Recommendations --   CHL IP FOLLOW UP RECOMMENDATIONS 10/03/2019 Follow up Recommendations None   CHL IP FREQUENCY AND DURATION 10/03/2019 Speech Therapy Frequency (ACUTE ONLY) min 2x/week Treatment Duration 2 weeks      CHL IP ORAL PHASE 10/03/2019 Oral Phase WFL Oral - Pudding Teaspoon -- Oral - Pudding Cup -- Oral - Honey Teaspoon -- Oral - Honey Cup -- Oral - Nectar Teaspoon -- Oral - Nectar Cup -- Oral - Nectar Straw -- Oral - Thin Teaspoon -- Oral - Thin Cup -- Oral - Thin Straw -- Oral - Puree -- Oral - Mech Soft -- Oral - Regular -- Oral - Multi-Consistency -- Oral - Pill -- Oral Phase - Comment --  CHL IP PHARYNGEAL PHASE 10/03/2019 Pharyngeal Phase Impaired Pharyngeal- Pudding Teaspoon -- Pharyngeal -- Pharyngeal-  Pudding Cup -- Pharyngeal -- Pharyngeal- Honey Teaspoon -- Pharyngeal -- Pharyngeal- Honey Cup -- Pharyngeal -- Pharyngeal- Nectar Teaspoon -- Pharyngeal -- Pharyngeal- Nectar Cup WFL Pharyngeal -- Pharyngeal- Nectar Straw -- Pharyngeal -- Pharyngeal- Thin Teaspoon -- Pharyngeal -- Pharyngeal- Thin Cup Pharyngeal residue - valleculae;Penetration/Aspiration during swallow Pharyngeal Material enters airway, remains ABOVE vocal cords then ejected out Pharyngeal- Thin Straw Pharyngeal residue - valleculae;Pharyngeal residue - pyriform;Penetration/Aspiration during swallow;Penetration/Apiration after swallow Pharyngeal Material enters airway, passes BELOW cords and not ejected out despite cough attempt by patient Pharyngeal- Puree -- Pharyngeal -- Pharyngeal- Mechanical Soft -- Pharyngeal -- Pharyngeal- Regular WFL Pharyngeal -- Pharyngeal- Multi-consistency -- Pharyngeal -- Pharyngeal- Pill -- Pharyngeal -- Pharyngeal Comment --  CHL IP CERVICAL ESOPHAGEAL PHASE 10/03/2019 Cervical Esophageal Phase WFL Pudding Teaspoon -- Pudding Cup -- Honey Teaspoon -- Honey Cup -- Nectar Teaspoon -- Nectar Cup -- Nectar Straw -- Thin Teaspoon -- Thin Cup -- Thin Straw -- Puree -- Mechanical Soft --  Regular -- Multi-consistency -- Pill -- Cervical Esophageal Comment -- Royce Macadamia 10/03/2019, 1:46 PM  Breck Coons [employer] administrator Pager [phone] Office [phone]             ECHOCARDIOGRAM COMPLETE BUBBLE STUDY  Result Date: 10/01/2019    ECHOCARDIOGRAM REPORT   Patient Name:   Alyssa Adams [facility] Date of Exam: 10/01/2019 Medical Rec #:  [id number]         Height:       62.0 in Accession #:    [id number]        Weight:       153.2 lb Date of Birth:  March 31, 1934         BSA:          1.707 m Patient Age:    85 years          BP:           169/72 mmHg Patient Gender: F                 HR:           63 bpm. Exam Location:  Inpatient Procedure: 2D Echo and Saline Contrast Bubble Study Indications:    Stroke  History:        Patient has no prior history of Echocardiogram examinations.                 Risk Factors:Hypertension.  Sonographer:    Thurman Coyer RDCS (AE) Referring Phys: [id number] NA LI IMPRESSIONS  1. Left ventricular ejection fraction, by estimation, is 70 to 75%. The left ventricle has hyperdynamic function. The left ventricle has no regional wall motion abnormalities. Left ventricular diastolic parameters are consistent with Grade I diastolic dysfunction (impaired relaxation).  2. Right ventricular systolic function is normal. The right ventricular size is normal. Tricuspid regurgitation signal is inadequate for assessing PA pressure.  3. The mitral valve is grossly normal. No evidence of mitral valve regurgitation. No evidence of mitral stenosis.  4. The aortic valve is tricuspid. Aortic valve regurgitation is not visualized. No aortic stenosis is present.  5. The inferior vena cava is normal in size with greater than 50% respiratory variability, suggesting right atrial pressure of 3 mmHg.  6. Agitated saline contrast bubble study was negative, with no evidence of any interatrial shunt. Conclusion(s)/Recommendation(s): No intracardiac source of embolism detected on  this transthoracic study. A transesophageal echocardiogram is recommended to exclude cardiac source of embolism if clinically indicated. FINDINGS  Left Ventricle: Left  ventricular ejection fraction, by estimation, is 70 to 75%. The left ventricle has hyperdynamic function. The left ventricle has no regional wall motion abnormalities. The left ventricular internal cavity size was normal in size. There is no left ventricular hypertrophy. Left ventricular diastolic parameters are consistent with Grade I diastolic dysfunction (impaired relaxation). Right Ventricle: The right ventricular size is normal. No increase in right ventricular wall thickness. Right ventricular systolic function is normal. Tricuspid regurgitation signal is inadequate for assessing PA pressure. Left Atrium: Left atrial size was normal in size. Right Atrium: Right atrial size was normal in size. Pericardium: Trivial pericardial effusion is present. Presence of pericardial fat pad. Mitral Valve: The mitral valve is grossly normal. No evidence of mitral valve regurgitation. No evidence of mitral valve stenosis. Tricuspid Valve: The tricuspid valve is grossly normal. Tricuspid valve regurgitation is not demonstrated. No evidence of tricuspid stenosis. Aortic Valve: The aortic valve is tricuspid. Aortic valve regurgitation is not visualized. No aortic stenosis is present. Pulmonic Valve: The pulmonic valve was grossly normal. Pulmonic valve regurgitation is not visualized. No evidence of pulmonic stenosis. Aorta: The aortic root and ascending aorta are structurally normal, with no evidence of dilitation. Venous: The inferior vena cava is normal in size with greater than 50% respiratory variability, suggesting right atrial pressure of 3 mmHg. IAS/Shunts: The atrial septum is grossly normal. Agitated saline contrast was given intravenously to evaluate for intracardiac shunting. Agitated saline contrast bubble study was negative, with no evidence of any  interatrial shunt.  LEFT VENTRICLE PLAX 2D LVIDd:         3.60 cm  Diastology LVIDs:         2.40 cm  LV e' lateral:   6.22 cm/s LV PW:         1.00 cm  LV E/e' lateral: 10.0 LV IVS:        1.00 cm  LV e' medial:    5.33 cm/s LVOT diam:     1.80 cm  LV E/e' medial:  11.7 LV SV:         68 LV SV Index:   40 LVOT Area:     2.54 cm  RIGHT VENTRICLE RV S prime:     16.60 cm/s TAPSE (M-mode): 2.1 cm LEFT ATRIUM             Index       RIGHT ATRIUM           Index LA diam:        2.30 cm 1.35 cm/m  RA Area:     10.10 cm LA Vol (A2C):   46.1 ml 27.01 ml/m RA Volume:   18.20 ml  10.66 ml/m LA Vol (A4C):   44.2 ml 25.89 ml/m LA Biplane Vol: 45.1 ml 26.42 ml/m  AORTIC VALVE LVOT Vmax:   124.00 cm/s LVOT Vmean:  96.900 cm/s LVOT VTI:    0.268 m  AORTA Ao Root diam: 2.60 cm MITRAL VALVE MV Area (PHT): 1.65 cm     SHUNTS MV Decel Time: 461 msec     Systemic VTI:  0.27 m MV E velocity: 62.10 cm/s   Systemic Diam: 1.80 cm MV A velocity: 101.00 cm/s MV E/A ratio:  0.61 Lennie Odor MD Electronically signed by Lennie Odor MD Signature Date/Time: 10/01/2019/3:47:30 PM    Final    CT HEAD CODE STROKE WO CONTRAST  Result Date: 09/30/2019 CLINICAL DATA:  Code stroke. Possible stroke, ataxia, stroke suspected. Additional history provided: Last known well 1300 EXAM:  CT HEAD WITHOUT CONTRAST TECHNIQUE: Contiguous axial images were obtained from the base of the skull through the vertex without intravenous contrast. COMPARISON:  Brain MRI 07/19/2005, brain MRI 08/01/2004, head CT 07/31/2004 FINDINGS: Brain: There is no evidence of acute intracranial hemorrhage. Ischemic changes within the left lentiform nucleus, progressed as compared to MRI 07/19/2005, but otherwise age indeterminate. Redemonstrated chronic lacunar infarct within the left corona radiata. Background advanced chronic small vessel ischemic disease and moderate generalized parenchymal atrophy, also progressed. There is no evidence of intracranial mass. No midline  shift or extra-axial fluid collection. Vascular: No hyperdense vessel.  Atherosclerotic calcifications. Skull: Normal. Negative for fracture or focal lesion. Sinuses/Orbits: Visualized orbits demonstrate no acute abnormality. No significant paranasal sinus disease or mastoid effusion at the imaged levels. ASPECTS (Alberta Stroke Program Early CT Score) - Ganglionic level infarction (caudate, lentiform nuclei, internal capsule, insula, M1-M3 cortex): 6 (point subtracted for age-indeterminate ischemic changes within the left lentiform nucleus) - Supraganglionic infarction (M4-M6 cortex): 3 Total score (0-10 with 10 being normal): 9 These results were called by telephone at the time of interpretation on 09/30/2019 at 5:14 pm to provider Dr. Wilford Corner, who verbally acknowledged these results. IMPRESSION: 1. Age-indeterminate ischemic changes within the left lentiform nucleus. Aspects 9. 2. No evidence of acute intracranial hemorrhage or acute demarcated cortical infarct. 3. Advanced chronic small vessel ischemic disease has progressed as compared to prior MRI 07/19/2005. Redemonstrated chronic lacunar infarct within the left corona radiata. 4. Moderate generalized parenchymal atrophy. Electronically Signed   By: Jackey Loge DO   On: 09/30/2019 17:17    Micro Results    Recent Results (from the past 240 hour(s))  Respiratory Panel by RT PCR (Flu A&B, Covid) - Nasopharyngeal Swab     Status: None   Collection Time: 09/30/19  5:32 PM   Specimen: Nasopharyngeal Swab  Result Value Ref Range Status   SARS Coronavirus 2 by RT PCR NEGATIVE NEGATIVE Final    Comment: (NOTE) SARS-CoV-2 target nucleic acids are NOT DETECTED. The SARS-CoV-2 RNA is generally detectable in upper respiratoy specimens during the acute phase of infection. The lowest concentration of SARS-CoV-2 viral copies this assay can detect is 131 copies/mL. A negative result does not preclude SARS-Cov-2 infection and should not be used as the sole  basis for treatment or other patient management decisions. A negative result may occur with  improper specimen collection/handling, submission of specimen other than nasopharyngeal swab, presence of viral mutation(s) within the areas targeted by this assay, and inadequate number of viral copies (<131 copies/mL). A negative result must be combined with clinical observations, patient history, and epidemiological information. The expected result is Negative. Fact Sheet for Patients:  https://www.moore.com/ Fact Sheet for Healthcare Providers:  https://www.young.biz/ This test is not yet ap proved or cleared by the [place] FDA and  has been authorized for detection and/or diagnosis of SARS-CoV-2 by FDA under an Emergency Use Authorization (EUA). This EUA will remain  in effect (meaning this test can be used) for the duration of the COVID-19 declaration under Section 564(b)(1) of the Act, 21 U.S.C. section 360bbb-3(b)(1), unless the authorization is terminated or revoked sooner.    Influenza A by PCR NEGATIVE NEGATIVE Final   Influenza B by PCR NEGATIVE NEGATIVE Final    Comment: (NOTE) The Xpert Xpress SARS-CoV-2/FLU/RSV assay is intended as an aid in  the diagnosis of influenza from Nasopharyngeal swab specimens and  should not be used as a sole basis for treatment. Nasal washings and  aspirates are  unacceptable for Xpert Xpress SARS-CoV-2/FLU/RSV  testing. Fact Sheet for Patients: https://www.moore.com/ Fact Sheet for Healthcare Providers: https://www.young.biz/ This test is not yet approved or cleared by the [place] FDA and  has been authorized for detection and/or diagnosis of SARS-CoV-2 by  FDA under an Emergency Use Authorization (EUA). This EUA will remain  in effect (meaning this test can be used) for the duration of the  Covid-19 declaration under Section 564(b)(1) of the Act, 21  U.S.C.  section 360bbb-3(b)(1), unless the authorization is  terminated or revoked. Performed at [facility], [address] [place], [place] [address]   SARS CORONAVIRUS 2 (TAT 6-24 HRS) Nasopharyngeal Nasopharyngeal Swab     Status: None   Collection Time: 10/05/19  9:37 AM   Specimen: Nasopharyngeal Swab  Result Value Ref Range Status   SARS Coronavirus 2 NEGATIVE NEGATIVE Final    Comment: (NOTE) SARS-CoV-2 target nucleic acids are NOT DETECTED. The SARS-CoV-2 RNA is generally detectable in upper and lower respiratory specimens during the acute phase of infection. Negative results do not preclude SARS-CoV-2 infection, do not rule out co-infections with other pathogens, and should not be used as the sole basis for treatment or other patient management decisions. Negative results must be combined with clinical observations, patient history, and epidemiological information. The expected result is Negative. Fact Sheet for Patients: HairSlick.no Fact Sheet for Healthcare Providers: quierodirigir.com This test is not yet approved or cleared by the [place] FDA and  has been authorized for detection and/or diagnosis of SARS-CoV-2 by FDA under an Emergency Use Authorization (EUA). This EUA will remain  in effect (meaning this test can be used) for the duration of the COVID-19 declaration under Section 56 4(b)(1) of the Act, 21 U.S.C. section 360bbb-3(b)(1), unless the authorization is terminated or revoked sooner. Performed at [facility], [address] [place], [place] [address]        Today   Subjective:   Rylynn Kobs today has no headache,no chest or abdominal pain, feels much better today.   Objective:   Blood pressure (!) 156/65, pulse (!) 55, temperature 98.5 F (36.9 C), temperature source Oral, resp. rate 18, height  (1.575 m), weight 66 kg, SpO2 97 %.  No intake or output data in the 24  hours ending 10/06/19 1043  Exam Awake Alert, pleasant ,Symmetrical Chest wall movement, Good air movement bilaterally, CTAB RRR,No Gallops,Rubs or new Murmurs, No Parasternal Heave +ve B.Sounds, Abd Soft, Non tender, No rebound -guarding or rigidity. No Cyanosis, Clubbing or edema, No new Rash or bruise Chronic right-sided hemiparesis   Data Review   CBC w Diff:  Lab Results  Component Value Date   WBC 7.8 10/04/2019   HGB 13.4 10/04/2019   HGB 13.3 11/22/2013   HCT 40.3 10/04/2019   HCT 39.2 11/22/2013   PLT 208 10/04/2019   PLT 274 11/22/2013   LYMPHOPCT 29 09/30/2019   LYMPHOPCT 28.6 11/22/2013   MONOPCT 7 09/30/2019   MONOPCT 7.4 11/22/2013   EOSPCT 4 09/30/2019   EOSPCT 3.6 11/22/2013   BASOPCT 1 09/30/2019   BASOPCT 1.0 11/22/2013    CMP:  Lab Results  Component Value Date   NA 141 10/06/2019   NA 143 11/22/2013   K 3.5 10/06/2019   K 3.2 (L) 11/22/2013   CL 108 10/06/2019   CL 107 11/22/2013   CO2 25 10/06/2019   CO2 29 11/22/2013   BUN 17 10/06/2019   BUN 6 (L) 11/22/2013  CREATININE 0.95 10/06/2019   CREATININE 0.85 11/22/2013   PROT 6.9 10/01/2019   PROT 7.3 11/21/2013   ALBUMIN 3.6 10/01/2019   ALBUMIN 3.0 (L) 11/21/2013   BILITOT 0.9 10/01/2019   BILITOT 0.5 11/21/2013   ALKPHOS 78 10/01/2019   ALKPHOS 64 11/21/2013   AST 19 10/01/2019   AST 25 11/21/2013   ALT 20 10/01/2019   ALT 25 11/21/2013  .   Total Time in preparing paper work, data evaluation and todays exam - 58 minutes  Phillips Climes M.D on 10/06/2019 at 10:43 AM  [facility]   Office  [phone]

## 2019-10-06 NOTE — TOC Transition Note (Signed)
Transition of Care [facility]) - CM/SW Discharge Note   Patient Details  Name: Alyssa Adams MRN: [id number] Date of Birth: 09-Jan-1934  Transition of Care [facility]) CM/SW Contact:  Truddie Hidden, LCSW Phone Number: 10/06/2019, 11:04 AM   Clinical Narrative:     Discharged to [facility] for SNF. Referral coordinated with [facility]. Patient's daughter [name] aware and agreeable to this plan. Number to call report [phone] given to unit RN Marcelino Duster. Dc summary sent via Epic. No other needs at this time. Case closed to this CSW.   Final next level of care: Skilled Nursing Facility Barriers to Discharge: Barriers Resolved   Patient Goals and CMS Choice Patient states their goals for this hospitalization and ongoing recovery are:: get stronger to return home CMS Medicare.gov Compare Post Acute Care list provided to:: Patient Choice offered to / list presented to : Patient  Discharge Placement   Existing PASRR number confirmed : 10/06/19          Patient chooses bed at: [facility] Patient to be transferred to facility by: [facility] Name of family member notified: [name] Patient and family notified of of transfer: 10/06/19  Discharge Plan and Services In-house Referral: Clinical Social Work   Post Acute Care Choice: Skilled Nursing Facility                               Social Determinants of Health (SDOH) Interventions     Readmission Risk Interventions No flowsheet data found.

## 2019-10-06 NOTE — Progress Notes (Signed)
  Speech Language Pathology Treatment: Dysphagia  Patient Details Name: Alyssa Adams MRN: [id number] DOB: 1934/01/28 Today's Date: 10/06/2019 Time: 2297-9892 SLP Time Calculation (min) (ACUTE ONLY): 12 min  Assessment / Plan / Recommendation Clinical Impression  Pt seen for skilled therapy for dysphagia following MBS 4/23. Pt recalled chin tuck independently and looked at sign on wall. Accurate with strategy in 3 or 4 trials with emergent awareness and self correction. Immediate and delayed throat clears and no significant difference when performing chin tuck per pt report. Added multiple swallows to pt's swallow precautions to decrease pharyngeal residue. Cued her to lift head before swallowing second time. No observable difficulty with solids. Recommend continue regular texture, thin liquids with chin tuck, pills whole in puree. Continue Speech therapy at SNF and chin tuck may be able to be phased out as pt gains strength as is closer to baseline as suspect most of her dysphagia from pharyngoesophageal source is chronic.    HPI HPI: Pt is 86 y.o. female with medical history of stroke with residual right-sided weakness, HTN, GERD, depression who presented with slurred speech and altered mental status. MRI revealed small abnormal diffusion restriction along the posteromedial L temporal lobe consistent with small acute infarct. CXR no active disease.      SLP Plan  Continue with current plan of care       Recommendations  Diet recommendations: Regular;Thin liquid Liquids provided via: Cup Medication Administration: Whole meds with puree Supervision: Patient able to self feed Compensations: Chin tuck;Small sips/bites;Slow rate;Multiple dry swallows after each bite/sip(tuck with liquids) Postural Changes and/or Swallow Maneuvers: Seated upright 90 degrees                Oral Care Recommendations: Oral care BID Follow up Recommendations: Skilled Nursing facility SLP Visit  Diagnosis: Dysphagia, pharyngeal phase (R13.13) Plan: Continue with current plan of care                      Alyssa Adams 10/06/2019, 9:57 AM  Alyssa Adams Alyssa Adams.Ed Nurse, children's [phone] Office [phone]

## 2019-10-06 NOTE — Discharge Instructions (Signed)
Follow with Primary MD Jerl Mina, MD in 7 days   Get CBC, CMP,  checked  by Primary MD next visit.    Activity: As tolerated with Full fall precautions use walker/cane & assistance as needed   Disposition SNF   Diet: Heart Healthy , with feeding assistance and aspiration precautions.   On your next visit with your primary care physician please Get Medicines reviewed and adjusted.   Please request your Prim.MD to go over all Hospital Tests and Procedure/Radiological results at the follow up, please get all Hospital records sent to your Prim MD by signing hospital release before you go home.   If you experience worsening of your admission symptoms, develop shortness of breath, life threatening emergency, suicidal or homicidal thoughts you must seek medical attention immediately by calling 911 or calling your MD immediately  if symptoms less severe.  You Must read complete instructions/literature along with all the possible adverse reactions/side effects for all the Medicines you take and that have been prescribed to you. Take any new Medicines after you have completely understood and accpet all the possible adverse reactions/side effects.   Do not drive, operating heavy machinery, perform activities at heights, swimming or participation in water activities or provide baby sitting services if your were admitted for syncope or siezures until you have seen by Primary MD or a Neurologist and advised to do so again.  Do not drive when taking Pain medications.    Do not take more than prescribed Pain, Sleep and Anxiety Medications  Special Instructions: If you have smoked or chewed Tobacco  in the last 2 yrs please stop smoking, stop any regular Alcohol  and or any Recreational drug use.  Wear Seat belts while driving.   Please note  You were cared for by a hospitalist during your hospital stay. If you have any questions about your discharge medications or the care you received  while you were in the hospital after you are discharged, you can call the unit and asked to speak with the hospitalist on call if the hospitalist that took care of you is not available. Once you are discharged, your primary care physician will handle any further medical issues. Please note that NO REFILLS for any discharge medications will be authorized once you are discharged, as it is imperative that you return to your primary care physician (or establish a relationship with a primary care physician if you do not have one) for your aftercare needs so that they can reassess your need for medications and monitor your lab values.

## 2019-10-07 ENCOUNTER — Encounter: Payer: Self-pay | Admitting: Internal Medicine

## 2019-10-07 ENCOUNTER — Non-Acute Institutional Stay (SKILLED_NURSING_FACILITY): Payer: PPO | Admitting: Internal Medicine

## 2019-10-07 DIAGNOSIS — I10 Essential (primary) hypertension: Secondary | ICD-10-CM | POA: Diagnosis not present

## 2019-10-07 DIAGNOSIS — K219 Gastro-esophageal reflux disease without esophagitis: Secondary | ICD-10-CM | POA: Diagnosis not present

## 2019-10-07 DIAGNOSIS — E785 Hyperlipidemia, unspecified: Secondary | ICD-10-CM

## 2019-10-07 DIAGNOSIS — I63332 Cerebral infarction due to thrombosis of left posterior cerebral artery: Secondary | ICD-10-CM | POA: Diagnosis not present

## 2019-10-07 DIAGNOSIS — I161 Hypertensive emergency: Secondary | ICD-10-CM

## 2019-10-07 DIAGNOSIS — N3281 Overactive bladder: Secondary | ICD-10-CM | POA: Diagnosis not present

## 2019-10-07 DIAGNOSIS — Z8673 Personal history of transient ischemic attack (TIA), and cerebral infarction without residual deficits: Secondary | ICD-10-CM | POA: Diagnosis not present

## 2019-10-07 NOTE — Progress Notes (Signed)
: Provider:  Margit HanksAlexander, Andreka Stucki D., MD Location:  [facility] Room Number: 504-P Place of Service:  SNF [phone])  PCP: Jerl MinaHedrick, James, MD Patient Care Team: Jerl MinaHedrick, James, MD as PCP - General (Family Medicine)  Extended Emergency Contact Information Primary Emergency Contact: Barkley BrunsMoore,Lynn  [place] Home Phone: [phone] Relation: Daughter     Allergies: Erythromycin ethylsuccinate and Sulfa antibiotics  Chief Complaint  Patient presents with  . New Admit To SNF    New admission to [facility]    HPI: Patient is an 86 y.o. female with significant stroke with residual right-sided weakness, hypertension, GERD, depression, who [facility] with slurred speech and altered mental status.  Patient was in her usual state of health until around 11-12 noon day of admission.  She started having slurred speech and confusion associated with headaches.  She denied any increased weakness.  In the emergency department her blood pressure max was 196/80.  Labs were normal.  EKG showed no acute changes.  CT and CTA of the head were nonrevealing neurology was consulted in the ED.  [facility]26 patient was ultimately found by MRI to have a acute CVA in the left temporal region and the left posterior cerebral artery.  Neurology recommended dual platelet therapy with Plavix for [redacted] weeks along with aspirin then aspirin alone.  Patient had permissive hypertension shortly after her admission but after several days her blood pressure meds were increased to control her blood pressure.  Patient is admitted to skilled nursing facility for OT/PT.  While at skilled nursing facility patient will be followed for hyperlipidemia treated with Lipitor, overactive bladder treated with Myrbetriq and GERD treated with Protonix.  Past Medical History:  Diagnosis Date  . Depression   . GERD (gastroesophageal reflux disease)    . Hypertension   . Stroke (cerebrum) [facility](HCC)     Past Surgical History:  Procedure Laterality Date  . ABDOMINAL HYSTERECTOMY    . GALLBLADDER SURGERY      Allergies as of 10/07/2019      Reactions   Erythromycin Ethylsuccinate Other (See Comments)   Reaction not recalled by the patient   Sulfa Antibiotics Other (See Comments)   Reaction not recalled by the patient      Medication List       Accurate as of October 07, 2019  9:01 PM. If you have any questions, ask your nurse or doctor.        amLODipine 10 MG tablet Commonly known as: NORVASC Take 1 tablet (10 mg total) by mouth daily.   aspirin 81 MG EC tablet Take 1 tablet (81 mg total) by mouth daily.   atorvastatin 40 MG tablet Commonly known as: LIPITOR Take 1 tablet (40 mg total) by mouth daily.   benzonatate 100 MG capsule Commonly known as: Tessalon Perles Take 1 capsule (100 mg total) by mouth 3 (three) times daily as needed.   cholestyramine light 4 g packet Commonly known as: PREVALITE Take 4 g by mouth daily before breakfast.   clopidogrel 75 MG tablet Commonly known as: PLAVIX Take 1 tablet (75 mg total) by mouth daily. Please take until 10/22/2019, then discontinue after that. Start taking on: Oct 22, 2019   hydrALAZINE 10 MG tablet Commonly known as: APRESOLINE Take 1 tablet (10 mg total) by mouth 4 (four) times daily.   mirabegron ER 50 MG Tb24 tablet Commonly known as: MYRBETRIQ Take  1 tablet (50 mg total) by mouth daily.   pantoprazole 40 MG tablet Commonly known as: PROTONIX Take 1 tablet (40 mg total) by mouth daily.   Vitamin D (Ergocalciferol) 1.25 MG (50000 UNIT) Caps capsule Commonly known as: DRISDOL Take 50,000 Units by mouth every 7 (seven) days.       No orders of the defined types were placed in this encounter.   Immunization History  Administered Date(s) Administered  . Influenza Inj Mdck Quad Pf 05/20/2018  . Influenza-Unspecified 04/28/2019, 06/13/2019    Social  History   Tobacco Use  . Smoking status: Never Smoker  . Smokeless tobacco: Never Used  Substance Use Topics  . Alcohol use: No    Family history is   Family History  Problem Relation Age of Onset  . Kidney cancer Father   . Prostate cancer Neg Hx       Review of Systems   GENERAL:  no fevers, fatigue, appetite changes SKIN: No itching, or rash EYES: No eye pain, redness, discharge EARS: No earache, tinnitus, change in hearing NOSE: No congestion, drainage or bleeding  MOUTH/THROAT: No mouth or tooth pain, No sore throat RESPIRATORY: No cough, wheezing, SOB CARDIAC: No chest pain, palpitations, lower extremity edema  GI: No abdominal pain, No N/V/D or constipation, No heartburn or reflux  GU: No dysuria, frequency or urgency, or incontinence  MUSCULOSKELETAL: No unrelieved bone/joint pain NEUROLOGIC: No headache, dizziness or focal weakness PSYCHIATRIC: No c/o anxiety or sadness   Vitals:   10/07/19 1610  BP: 116/63  Pulse: 72  Resp: 17  Temp: 98.6 F (37 C)  SpO2: 96%    SpO2 Readings from Last 1 Encounters:  10/07/19 96%   Body mass index is 26.61 kg/m.     Physical Exam  GENERAL APPEARANCE: Alert, conversant,  No acute distress.  SKIN: No diaphoresis rash HEAD: Normocephalic, atraumatic  EYES: Conjunctiva/lids clear. Pupils round, reactive. EOMs intact.  EARS: External exam WNL, canals clear. Hearing grossly normal.  NOSE: No deformity or discharge.  MOUTH/THROAT: Lips w/o lesions  RESPIRATORY: Breathing is even, unlabored. Lung sounds are clear   CARDIOVASCULAR: Heart RRR no murmurs, rubs or gallops. No peripheral edema.   GASTROINTESTINAL: Abdomen is soft, non-tender, not distended w/ normal bowel sounds. GENITOURINARY: Bladder non tender, not distended  MUSCULOSKELETAL: No abnormal joints or musculature NEUROLOGIC:  Cranial nerves 2-12 grossly intact. Moves all extremities with right-sided weakness PSYCHIATRIC: Mood and affect appropriate to  situation, no behavioral issues  Patient Active Problem List   Diagnosis Date Noted  . Cerebrovascular accident (CVA) due to thrombosis of left posterior cerebral artery ([place])   . Hypertensive emergency 09/30/2019  . History of stroke in adulthood 09/30/2019  . Essential hypertension 09/30/2019  . GERD (gastroesophageal reflux disease) 09/30/2019  . Depression 09/30/2019      Labs reviewed: Basic Metabolic Panel:    Component Value Date/Time   NA 141 10/06/2019 0514   NA 143 11/22/2013 0445   K 3.5 10/06/2019 0514   K 3.2 (L) 11/22/2013 0445   CL 108 10/06/2019 0514   CL 107 11/22/2013 0445   CO2 25 10/06/2019 0514   CO2 29 11/22/2013 0445   GLUCOSE 100 (H) 10/06/2019 0514   GLUCOSE 87 11/22/2013 0445   BUN 17 10/06/2019 0514   BUN 6 (L) 11/22/2013 0445   CREATININE 0.95 10/06/2019 0514   CREATININE 0.85 11/22/2013 0445   CALCIUM 8.9 10/06/2019 0514   CALCIUM 8.9 11/22/2013 0445   PROT 6.9 10/01/2019 0347  PROT 7.3 11/21/2013 0449   ALBUMIN 3.6 10/01/2019 0347   ALBUMIN 3.0 (L) 11/21/2013 0449   AST 19 10/01/2019 0347   AST 25 11/21/2013 0449   ALT 20 10/01/2019 0347   ALT 25 11/21/2013 0449   ALKPHOS 78 10/01/2019 0347   ALKPHOS 64 11/21/2013 0449   BILITOT 0.9 10/01/2019 0347   BILITOT 0.5 11/21/2013 0449   GFRNONAA 55 (L) 10/06/2019 0514   GFRNONAA >60 11/22/2013 0445   GFRAA >60 10/06/2019 0514   GFRAA >60 11/22/2013 0445    Recent Labs    10/04/19 0526 10/05/19 0351 10/06/19 0514  NA 144 143 141  K 3.1* 3.4* 3.5  CL 109 107 108  CO2 26 28 25   GLUCOSE 108* 110* 100*  BUN 14 14 17   CREATININE 0.89 0.96 0.95  CALCIUM 9.3 9.2 8.9   Liver Function Tests: Recent Labs    09/30/19 1653 10/01/19 0347  AST 35 19  ALT 15 20  ALKPHOS 69 78  BILITOT 1.7* 0.9  PROT 6.7 6.9  ALBUMIN 3.8 3.6   No results for input(s): LIPASE, AMYLASE in the last 8760 hours. No results for input(s): AMMONIA in the last 8760 hours. CBC: Recent Labs     09/30/19 1653 09/30/19 1707 10/01/19 0347 10/02/19 0730 10/04/19 0526  WBC 7.6   < > 9.1 6.8 7.8  NEUTROABS 4.5  --   --   --   --   HGB 13.6   < > 12.9 12.9 13.4  HCT 41.8   < > 39.3 39.2 40.3  MCV 97.4   < > 97.0 96.3 95.3  PLT 253   < > 208 216 208   < > = values in this interval not displayed.   Lipid Recent Labs    10/01/19 0347  CHOL 200  HDL 55  LDLCALC 111*  TRIG 170*    Cardiac Enzymes: No results for input(s): CKTOTAL, CKMB, CKMBINDEX, TROPONINI in the last 8760 hours. BNP: No results for input(s): BNP in the last 8760 hours. No results found for: [facility] Lab Results  Component Value Date   HGBA1C 5.6 10/01/2019   No results found for: TSH Lab Results  Component Value Date   VITAMINB12 131 (L) 10/01/2019   No results found for: FOLATE No results found for: IRON, TIBC, FERRITIN  Imaging and Procedures obtained prior to SNF admission: CT Code Stroke CTA Head W/WO contrast  Result Date: 09/30/2019 CLINICAL DATA:  Altered mental status.  Weakness. EXAM: CT ANGIOGRAPHY HEAD AND NECK TECHNIQUE: Multidetector CT imaging of the head and neck was performed using the standard protocol during bolus administration of intravenous contrast. Multiplanar CT image reconstructions and MIPs were obtained to evaluate the vascular anatomy. Carotid stenosis measurements (when applicable) are obtained utilizing NASCET criteria, using the distal internal carotid diameter as the denominator. CONTRAST:  83mL OMNIPAQUE IOHEXOL 350 MG/ML SOLN COMPARISON:  None. FINDINGS: CTA NECK FINDINGS Aortic arch: Standard 3 vessel aortic arch with minimal atherosclerotic plaque. Widely patent arch vessel origins. Right carotid system: Patent with a moderate amount of predominantly calcified plaque at the carotid bifurcation. No evidence of significant stenosis or dissection. Tortuous proximal common carotid artery. Left carotid system: Patent with extensive calcified plaque at the carotid  bifurcation resulting in less than 50% narrowing of the ICA origin. Tortuous proximal common carotid artery. Vertebral arteries: Patent and codominant without evidence of stenosis or dissection. Skeleton: Moderate diffuse cervical disc degeneration. Other neck: No evidence of cervical lymphadenopathy or mass. Upper chest:  Clear lung apices. Review of the MIP images confirms the above findings CTA HEAD FINDINGS Anterior circulation: The internal carotid arteries are patent from skull base to carotid termini. Calcified plaque in the cavernous and proximal supraclinoid segments bilaterally does not result in significant stenosis. There is a punctate calcification at the left ICA terminus, favored to be chronic as it was likely faintly visible on a 2006 head CT. This likely results in moderate left MCA origin stenosis and potentially severe left ACA origin stenosis. The left A1 segment is hypoplastic, and the left ACA is primarily supplied from the right via the anterior communicating artery. The right A1 segment is widely patent. Both MCAs are patent without evidence of proximal branch occlusion. There are mild left M1 and proximal right M2 stenoses. No aneurysm is identified. Posterior circulation: The intracranial vertebral arteries are patent to the basilar with calcified plaque resulting in mild proximal right V4 stenosis. Patent PICAs and SCAs are seen bilaterally. The basilar artery is patent and small in caliber congenitally. There are large posterior communicating arteries with hypoplastic right and absent left posterior communicating arteries. The PCAs are patent with branch vessel irregularity but no significant proximal stenosis. No aneurysm is identified. Venous sinuses: Poorly evaluated due to contrast timing. Anatomic variants: Predominantly fetal origin of the PCAs. Review of the MIP images confirms the above findings IMPRESSION: 1. No large vessel occlusion. 2. Suspected chronic calcification at the  left ICA terminus resulting in ACA and MCA origin stenoses. 3. Mild distal right vertebral artery stenosis. 4. Left greater than right cervical carotid artery atherosclerosis without significant stenosis. These results were communicated to Dr. Wilford Corner at 5:20 pm on 09/30/2019 by text page via the [facility] messaging system. Electronically Signed   By: Sebastian Ache M.D.   On: 09/30/2019 17:34   DG Chest 1 View  Result Date: 09/30/2019 CLINICAL DATA:  Slurred speech EXAM: CHEST  1 VIEW COMPARISON:  06/07/2012 FINDINGS: The heart size and mediastinal contours are within normal limits. Both lungs are clear. The visualized skeletal structures are unremarkable. IMPRESSION: No active disease. Electronically Signed   By: Jasmine Pang M.D.   On: 09/30/2019 22:51   CT Code Stroke CTA Neck W/WO contrast  Result Date: 09/30/2019 CLINICAL DATA:  Altered mental status.  Weakness. EXAM: CT ANGIOGRAPHY HEAD AND NECK TECHNIQUE: Multidetector CT imaging of the head and neck was performed using the standard protocol during bolus administration of intravenous contrast. Multiplanar CT image reconstructions and MIPs were obtained to evaluate the vascular anatomy. Carotid stenosis measurements (when applicable) are obtained utilizing NASCET criteria, using the distal internal carotid diameter as the denominator. CONTRAST:  48mL OMNIPAQUE IOHEXOL 350 MG/ML SOLN COMPARISON:  None. FINDINGS: CTA NECK FINDINGS Aortic arch: Standard 3 vessel aortic arch with minimal atherosclerotic plaque. Widely patent arch vessel origins. Right carotid system: Patent with a moderate amount of predominantly calcified plaque at the carotid bifurcation. No evidence of significant stenosis or dissection. Tortuous proximal common carotid artery. Left carotid system: Patent with extensive calcified plaque at the carotid bifurcation resulting in less than 50% narrowing of the ICA origin. Tortuous proximal common carotid artery. Vertebral arteries: Patent and  codominant without evidence of stenosis or dissection. Skeleton: Moderate diffuse cervical disc degeneration. Other neck: No evidence of cervical lymphadenopathy or mass. Upper chest: Clear lung apices. Review of the MIP images confirms the above findings CTA HEAD FINDINGS Anterior circulation: The internal carotid arteries are patent from skull base to carotid termini. Calcified plaque in the cavernous  and proximal supraclinoid segments bilaterally does not result in significant stenosis. There is a punctate calcification at the left ICA terminus, favored to be chronic as it was likely faintly visible on a 2006 head CT. This likely results in moderate left MCA origin stenosis and potentially severe left ACA origin stenosis. The left A1 segment is hypoplastic, and the left ACA is primarily supplied from the right via the anterior communicating artery. The right A1 segment is widely patent. Both MCAs are patent without evidence of proximal branch occlusion. There are mild left M1 and proximal right M2 stenoses. No aneurysm is identified. Posterior circulation: The intracranial vertebral arteries are patent to the basilar with calcified plaque resulting in mild proximal right V4 stenosis. Patent PICAs and SCAs are seen bilaterally. The basilar artery is patent and small in caliber congenitally. There are large posterior communicating arteries with hypoplastic right and absent left posterior communicating arteries. The PCAs are patent with branch vessel irregularity but no significant proximal stenosis. No aneurysm is identified. Venous sinuses: Poorly evaluated due to contrast timing. Anatomic variants: Predominantly fetal origin of the PCAs. Review of the MIP images confirms the above findings IMPRESSION: 1. No large vessel occlusion. 2. Suspected chronic calcification at the left ICA terminus resulting in ACA and MCA origin stenoses. 3. Mild distal right vertebral artery stenosis. 4. Left greater than right cervical  carotid artery atherosclerosis without significant stenosis. These results were communicated to Dr. Wilford Corner at 5:20 pm on 09/30/2019 by text page via the [facility] messaging system. Electronically Signed   By: Sebastian Ache M.D.   On: 09/30/2019 17:34   MR BRAIN WO CONTRAST  Result Date: 09/30/2019 CLINICAL DATA:  Possible stroke. EXAM: MRI HEAD WITHOUT CONTRAST TECHNIQUE: Multiplanar, multiecho pulse sequences of the brain and surrounding structures were obtained without intravenous contrast. COMPARISON:  Brain MRI 07/19/2005 FINDINGS: Brain: Small focus of abnormal diffusion restriction along the posteromedial left temporal lobe. No other diffusion abnormality. Diffuse confluent hyperintense T2-weighted signal within the periventricular, deep and juxtacortical white matter, most commonly due to chronic ischemic microangiopathy. There is generalized atrophy without lobar predilection. There are a few chronic microhemorrhages in a predominantly central distribution. Normal midline structures. Vascular: Normal flow voids. Skull and upper cervical spine: Normal marrow signal. Sinuses/Orbits: Negative. Other: None. IMPRESSION: 1. Small focus of abnormal diffusion restriction along the posteromedial left temporal lobe, consistent with a small acute infarct. 2. Severe chronic ischemic microangiopathy and generalized atrophy. Chronic micro 3. Chronic microhemorrhage within a predominantly central distribution, likely indicating chronic hypertensive angiopathy. Electronically Signed   By: Deatra Robinson M.D.   On: 09/30/2019 23:42   ECHOCARDIOGRAM COMPLETE BUBBLE STUDY  Result Date: 10/01/2019    ECHOCARDIOGRAM REPORT   Patient Name:   KENDALL ARNELL [facility] Date of Exam: 10/01/2019 Medical Rec #:  [id number]         Height:       62.0 in Accession #:    [id number]        Weight:       153.2 lb Date of Birth:  1933/06/27         BSA:          1.707 m Patient Age:    85 years          BP:           169/72 mmHg Patient Gender: F                  HR:  63 bpm. Exam Location:  Inpatient Procedure: 2D Echo and Saline Contrast Bubble Study Indications:    Stroke  History:        Patient has no prior history of Echocardiogram examinations.                 Risk Factors:Hypertension.  Sonographer:    Thurman Coyer RDCS (AE) Referring Phys: [id number] NA LI IMPRESSIONS  1. Left ventricular ejection fraction, by estimation, is 70 to 75%. The left ventricle has hyperdynamic function. The left ventricle has no regional wall motion abnormalities. Left ventricular diastolic parameters are consistent with Grade I diastolic dysfunction (impaired relaxation).  2. Right ventricular systolic function is normal. The right ventricular size is normal. Tricuspid regurgitation signal is inadequate for assessing PA pressure.  3. The mitral valve is grossly normal. No evidence of mitral valve regurgitation. No evidence of mitral stenosis.  4. The aortic valve is tricuspid. Aortic valve regurgitation is not visualized. No aortic stenosis is present.  5. The inferior vena cava is normal in size with greater than 50% respiratory variability, suggesting right atrial pressure of 3 mmHg.  6. Agitated saline contrast bubble study was negative, with no evidence of any interatrial shunt. Conclusion(s)/Recommendation(s): No intracardiac source of embolism detected on this transthoracic study. A transesophageal echocardiogram is recommended to exclude cardiac source of embolism if clinically indicated. FINDINGS  Left Ventricle: Left ventricular ejection fraction, by estimation, is 70 to 75%. The left ventricle has hyperdynamic function. The left ventricle has no regional wall motion abnormalities. The left ventricular internal cavity size was normal in size. There is no left ventricular hypertrophy. Left ventricular diastolic parameters are consistent with Grade I diastolic dysfunction (impaired relaxation). Right Ventricle: The right ventricular size is normal. No increase  in right ventricular wall thickness. Right ventricular systolic function is normal. Tricuspid regurgitation signal is inadequate for assessing PA pressure. Left Atrium: Left atrial size was normal in size. Right Atrium: Right atrial size was normal in size. Pericardium: Trivial pericardial effusion is present. Presence of pericardial fat pad. Mitral Valve: The mitral valve is grossly normal. No evidence of mitral valve regurgitation. No evidence of mitral valve stenosis. Tricuspid Valve: The tricuspid valve is grossly normal. Tricuspid valve regurgitation is not demonstrated. No evidence of tricuspid stenosis. Aortic Valve: The aortic valve is tricuspid. Aortic valve regurgitation is not visualized. No aortic stenosis is present. Pulmonic Valve: The pulmonic valve was grossly normal. Pulmonic valve regurgitation is not visualized. No evidence of pulmonic stenosis. Aorta: The aortic root and ascending aorta are structurally normal, with no evidence of dilitation. Venous: The inferior vena cava is normal in size with greater than 50% respiratory variability, suggesting right atrial pressure of 3 mmHg. IAS/Shunts: The atrial septum is grossly normal. Agitated saline contrast was given intravenously to evaluate for intracardiac shunting. Agitated saline contrast bubble study was negative, with no evidence of any interatrial shunt.  LEFT VENTRICLE PLAX 2D LVIDd:         3.60 cm  Diastology LVIDs:         2.40 cm  LV e' lateral:   6.22 cm/s LV PW:         1.00 cm  LV E/e' lateral: 10.0 LV IVS:        1.00 cm  LV e' medial:    5.33 cm/s LVOT diam:     1.80 cm  LV E/e' medial:  11.7 LV SV:         68 LV SV Index:  40 LVOT Area:     2.54 cm  RIGHT VENTRICLE RV S prime:     16.60 cm/s TAPSE (M-mode): 2.1 cm LEFT ATRIUM             Index       RIGHT ATRIUM           Index LA diam:        2.30 cm 1.35 cm/m  RA Area:     10.10 cm LA Vol (A2C):   46.1 ml 27.01 ml/m RA Volume:   18.20 ml  10.66 ml/m LA Vol (A4C):   44.2 ml  25.89 ml/m LA Biplane Vol: 45.1 ml 26.42 ml/m  AORTIC VALVE LVOT Vmax:   124.00 cm/s LVOT Vmean:  96.900 cm/s LVOT VTI:    0.268 m  AORTA Ao Root diam: 2.60 cm MITRAL VALVE MV Area (PHT): 1.65 cm     SHUNTS MV Decel Time: 461 msec     Systemic VTI:  0.27 m MV E velocity: 62.10 cm/s   Systemic Diam: 1.80 cm MV A velocity: 101.00 cm/s MV E/A ratio:  0.61 Lennie Odor MD Electronically signed by Lennie Odor MD Signature Date/Time: 10/01/2019/3:47:30 PM    Final    CT HEAD CODE STROKE WO CONTRAST  Result Date: 09/30/2019 CLINICAL DATA:  Code stroke. Possible stroke, ataxia, stroke suspected. Additional history provided: Last known well 1300 EXAM: CT HEAD WITHOUT CONTRAST TECHNIQUE: Contiguous axial images were obtained from the base of the skull through the vertex without intravenous contrast. COMPARISON:  Brain MRI 07/19/2005, brain MRI 08/01/2004, head CT 07/31/2004 FINDINGS: Brain: There is no evidence of acute intracranial hemorrhage. Ischemic changes within the left lentiform nucleus, progressed as compared to MRI 07/19/2005, but otherwise age indeterminate. Redemonstrated chronic lacunar infarct within the left corona radiata. Background advanced chronic small vessel ischemic disease and moderate generalized parenchymal atrophy, also progressed. There is no evidence of intracranial mass. No midline shift or extra-axial fluid collection. Vascular: No hyperdense vessel.  Atherosclerotic calcifications. Skull: Normal. Negative for fracture or focal lesion. Sinuses/Orbits: Visualized orbits demonstrate no acute abnormality. No significant paranasal sinus disease or mastoid effusion at the imaged levels. ASPECTS ([place]ta Stroke Program Early CT Score) - Ganglionic level infarction (caudate, lentiform nuclei, internal capsule, insula, M1-M3 cortex): 6 (point subtracted for age-indeterminate ischemic changes within the left lentiform nucleus) - Supraganglionic infarction (M4-M6 cortex): 3 Total score (0-10 with  10 being normal): 9 These results were called by telephone at the time of interpretation on 09/30/2019 at 5:14 pm to provider Dr. Wilford Corner, who verbally acknowledged these results. IMPRESSION: 1. Age-indeterminate ischemic changes within the left lentiform nucleus. Aspects 9. 2. No evidence of acute intracranial hemorrhage or acute demarcated cortical infarct. 3. Advanced chronic small vessel ischemic disease has progressed as compared to prior MRI 07/19/2005. Redemonstrated chronic lacunar infarct within the left corona radiata. 4. Moderate generalized parenchymal atrophy. Electronically Signed   By: Jackey Loge DO   On: 09/30/2019 17:17     Not all labs, radiology exams or other studies done during hospitalization come through on my EPIC note; however they are reviewed by me.    Assessment and Plan  Acute CVA of the left posterior cerebral artery-felt to be thrombus; head and neck no evidence of large vessel occlusion with chronic calcification of the left ICA and ACA and MCA origin stenosis as well as distal right vertebral artery stenosis.  Recommend dual antiplatelet therapy with aspirin and Plavix for 3 weeks then aspirin alone.  2D  echo with EF 70-75%; SLP consulted recommend regular diet with thin liquids SNF-admitted for OT/PT; continue Plavix 75 mg daily for 3 weeks then stop; continue ASA 81 mg daily; continue regular diet with thin liquid  Essential hypertension/hypertensive urgency-at presentation blood pressure was 196/80, permissive hypertension allowed initially but when blood pressure started to increase Norvasc 10 mg was started and hydralazine was added SNF-continue Norvasc 10 mg daily and hydralazine 10 mg 4 times daily  History of CVA with right-sided hemiparesis SNF-continue ASA 81 mg daily  GERD SNF-not stated as uncontrolled; continue Protonix 40 mg daily  Hyperlipidemia SNF-; continue Lipitor 40 mg daily  OAB  SNF-no status uncontrolled; continue Myrbetriq ER 50 mg  daily    Time spent greater than 45 minutes;> 50% of time with patient was spent reviewing records, labs, tests and studies, counseling and developing plan of care  Margit Hanks, MD

## 2019-10-08 ENCOUNTER — Encounter: Payer: Self-pay | Admitting: Internal Medicine

## 2019-10-08 DIAGNOSIS — E785 Hyperlipidemia, unspecified: Secondary | ICD-10-CM | POA: Insufficient documentation

## 2019-10-08 DIAGNOSIS — N3281 Overactive bladder: Secondary | ICD-10-CM | POA: Insufficient documentation

## 2019-10-14 LAB — COMPREHENSIVE METABOLIC PANEL
Calcium: 9.4 (ref 8.7–10.7)
GFR calc Af Amer: 68.52
GFR calc non Af Amer: 59.12

## 2019-10-14 LAB — CBC AND DIFFERENTIAL
HCT: 40 (ref 36–46)
Hemoglobin: 13.2 (ref 12.0–16.0)
Platelets: 193 (ref 150–399)
WBC: 8.1

## 2019-10-14 LAB — BASIC METABOLIC PANEL
BUN: 21 (ref 4–21)
CO2: 23 — AB (ref 13–22)
Chloride: 108 (ref 99–108)
Creatinine: 0.9 (ref 0.5–1.1)
Glucose: 102
Potassium: 4.2 (ref 3.4–5.3)
Sodium: 145 (ref 137–147)

## 2019-10-14 LAB — CBC: RBC: 4.17 (ref 3.87–5.11)

## 2019-10-18 DIAGNOSIS — N3281 Overactive bladder: Secondary | ICD-10-CM | POA: Diagnosis not present

## 2019-10-18 DIAGNOSIS — F329 Major depressive disorder, single episode, unspecified: Secondary | ICD-10-CM | POA: Diagnosis not present

## 2019-10-18 DIAGNOSIS — I10 Essential (primary) hypertension: Secondary | ICD-10-CM | POA: Diagnosis not present

## 2019-10-18 DIAGNOSIS — I69328 Other speech and language deficits following cerebral infarction: Secondary | ICD-10-CM | POA: Diagnosis not present

## 2019-10-18 DIAGNOSIS — I69351 Hemiplegia and hemiparesis following cerebral infarction affecting right dominant side: Secondary | ICD-10-CM | POA: Diagnosis not present

## 2019-10-18 DIAGNOSIS — K219 Gastro-esophageal reflux disease without esophagitis: Secondary | ICD-10-CM | POA: Diagnosis not present

## 2019-10-18 DIAGNOSIS — E785 Hyperlipidemia, unspecified: Secondary | ICD-10-CM | POA: Diagnosis not present

## 2019-10-22 ENCOUNTER — Encounter: Payer: Self-pay | Admitting: Internal Medicine

## 2019-10-22 ENCOUNTER — Non-Acute Institutional Stay (SKILLED_NURSING_FACILITY): Payer: PPO | Admitting: Internal Medicine

## 2019-10-22 DIAGNOSIS — E785 Hyperlipidemia, unspecified: Secondary | ICD-10-CM

## 2019-10-22 DIAGNOSIS — K219 Gastro-esophageal reflux disease without esophagitis: Secondary | ICD-10-CM | POA: Diagnosis not present

## 2019-10-22 DIAGNOSIS — I63332 Cerebral infarction due to thrombosis of left posterior cerebral artery: Secondary | ICD-10-CM

## 2019-10-22 DIAGNOSIS — N3281 Overactive bladder: Secondary | ICD-10-CM | POA: Diagnosis not present

## 2019-10-22 DIAGNOSIS — I10 Essential (primary) hypertension: Secondary | ICD-10-CM | POA: Diagnosis not present

## 2019-10-22 NOTE — Progress Notes (Signed)
Location:    [facility] Room Number: 101/P Place of Service:  SNF (31)  Provider: Estill Batten  PCP: Jerl Mina, MD Patient Care Team: Jerl Mina, MD as PCP - General (Family Medicine)  Extended Emergency Contact Information Primary Emergency Contact: Barkley Bruns [place] Home Phone: [phone] Relation: Daughter  Code Status: DNR Goals of care:  Advanced Directive information Advanced Directives 10/22/2019  Does Patient Have a Medical Advance Directive? Yes  Type of Advance Directive Out of facility DNR (pink MOST or yellow form);Living will;Healthcare Power of Attorney  Does patient want to make changes to medical advance directive? No - Patient declined  Copy of Healthcare Power of Attorney in Chart? Yes - validated most recent copy scanned in chart (See row information)  Pre-existing out of facility DNR order (yellow form or pink MOST form) Yellow form placed in chart (order not valid for inpatient use)     Allergies  Allergen Reactions  . Erythromycin Ethylsuccinate Other (See Comments)    Reaction not recalled by the patient  . Sulfa Antibiotics Other (See Comments)    Reaction not recalled by the patient    Chief Complaint  Patient presents with  . Discharge Note    Discharge Visit    HPI:  86 y.o. female seen today for discharge from facility scheduled for Friday, Oct 24, 2019. She has been here for rehab after sustaining a CVA with right-sided weakness.  Her other diagnoses include hypertension GERD depression.  She presented to the hospital with slurred speech and altered mental status.  Her blood pressure was elevated at 196/80.  She was ultimately found by MRI to have an acute CVA in the left temporal region and left posterior cerebral artery.  Neurology recommended dual platelet therapy with Plavix for [redacted] weeks along with aspirin and then aspirin alone-she has just been started on the sole  aspirin regimen secondary to completing her Plavix course.  Gradually her blood pressure meds were increased they did allow initially some permissive hypertension.  She has been here for PT and OT and has done well she is using a walker still has right-sided weakness but says she is quite careful when using her right leg.  Her other medical conditions including hypertension GERD and overactive bladder appear to be relatively stable.  She does have a history of hyperlipidemia continues on Lipitor 40 mg a day -Her LDL was 111 on April lab-note she is also on cholestyramine  She is looking forward to going home-apparently she will be receiving a lot of support from her daughter.  Currently she has no complaints she is looking forward to going home nursing has not reported any recent acute issues.  Vital signs appear to be stable     Past Medical History:  Diagnosis Date  . Depression   . GERD (gastroesophageal reflux disease)   . Hypertension   . Stroke (cerebrum) [facility])     Past Surgical History:  Procedure Laterality Date  . ABDOMINAL HYSTERECTOMY    . GALLBLADDER SURGERY        reports that she has never smoked. She has never used smokeless tobacco. She reports that she does not drink alcohol. No history on file for drug. Social History   Socioeconomic History  . Marital status: Married    Spouse name: Not on file  . Number of children: Not on file  . Years of education: Not on file  . Highest education level: Not  on file  Occupational History  . Not on file  Tobacco Use  . Smoking status: Never Smoker  . Smokeless tobacco: Never Used  Substance and Sexual Activity  . Alcohol use: No  . Drug use: Not on file  . Sexual activity: Not on file  Other Topics Concern  . Not on file  Social History Narrative   Patient lives at home by herself and a caregiver comes during the day and her daughter stays with her at nighttime.   Social Determinants of Health   Financial  Resource Strain:   . Difficulty of Paying Living Expenses:   Food Insecurity:   . Worried About Programme researcher, broadcasting/film/video in the Last Year:   . Barista in the Last Year:   Transportation Needs:   . Freight forwarder (Medical):   Marland Kitchen Lack of Transportation (Non-Medical):   Physical Activity:   . Days of Exercise per Week:   . Minutes of Exercise per Session:   Stress:   . Feeling of Stress :   Social Connections:   . Frequency of Communication with Friends and Family:   . Frequency of Social Gatherings with Friends and Family:   . Attends Religious Services:   . Active Member of Clubs or Organizations:   . Attends Banker Meetings:   Marland Kitchen Marital Status:   Intimate Partner Violence:   . Fear of Current or Ex-Partner:   . Emotionally Abused:   Marland Kitchen Physically Abused:   . Sexually Abused:    Functional Status Survey:    Allergies  Allergen Reactions  . Erythromycin Ethylsuccinate Other (See Comments)    Reaction not recalled by the patient  . Sulfa Antibiotics Other (See Comments)    Reaction not recalled by the patient    Pertinent  Health Maintenance Due  Topic Date Due  . DEXA SCAN  Never done  . PNA vac Low Risk Adult (1 of 2 - PCV13) Never done  . INFLUENZA VACCINE  01/11/2020    Medications: Allergies as of 10/22/2019      Reactions   Erythromycin Ethylsuccinate Other (See Comments)   Reaction not recalled by the patient   Sulfa Antibiotics Other (See Comments)   Reaction not recalled by the patient      Medication List       Accurate as of Oct 22, 2019  3:38 PM. If you have any questions, ask your nurse or doctor.        STOP taking these medications   clopidogrel 75 MG tablet Commonly known as: PLAVIX Stopped by: Edmon Crape, PA-C     TAKE these medications   amLODipine 10 MG tablet Commonly known as: NORVASC Take 1 tablet (10 mg total) by mouth daily.   aspirin 81 MG EC tablet Take 1 tablet (81 mg total) by mouth daily.     atorvastatin 40 MG tablet Commonly known as: LIPITOR Take 1 tablet (40 mg total) by mouth daily.   benzonatate 100 MG capsule Commonly known as: Tessalon Perles Take 1 capsule (100 mg total) by mouth 3 (three) times daily as needed.   BOOST BREEZE PO Boost Breeze qd d/t poor appetite at admission.   cholestyramine light 4 g packet Commonly known as: PREVALITE Take 4 g by mouth daily before breakfast.   hydrALAZINE 10 MG tablet Commonly known as: APRESOLINE Take 1 tablet (10 mg total) by mouth 4 (four) times daily.   mirabegron ER 50 MG Tb24 tablet Commonly known  as: MYRBETRIQ Take 1 tablet (50 mg total) by mouth daily.   pantoprazole 40 MG tablet Commonly known as: PROTONIX Take 1 tablet (40 mg total) by mouth daily.   Vitamin D (Ergocalciferol) 1.25 MG (50000 UNIT) Caps capsule Commonly known as: DRISDOL Take 50,000 Units by mouth every 7 (seven) days.       Review of Systems   In general she is not complaining of any fever chills.  Skin does not complain of rashes or itching.  Head ears eyes nose mouth and throat is not complain of visual changes or sore throat.  Respiratory is not complain of being short of breath or having a cough.  Cardiac no complaints of chest pain or increasing edema.  GI is not complaining of abdominal pain nausea vomiting diarrhea constipation.  GU does not complain of dysuria.  Musculoskeletal continues with right-sided weakness but has done well with therapy not complaining of joint pain currently.  Neurologic does not complain of dizziness headache numbness or syncope.  Does have right-sided weakness.  Psych does not complain of being depressed or anxious appears to be in good spirits.    Vitals:   10/22/19 1526  BP: 123/70  Pulse: 70  Resp: 18  Temp: (!) 97 F (36.1 C)  TempSrc: Oral  SpO2: 96%  Weight: 142 lb 3.2 oz (64.5 kg)  Height: 5\' 2"  (1.575 m)   Body mass index is 26.01 kg/m. Physical Exam General this  is a very pleasant elderly female in no distress sitting comfortably in her chair.  Her skin is warm and dry.  Eyes visual acuity appears to be intact sclera and conjunctive are clear.  Oropharynx clear mucous membranes moist.  Chest is clear to auscultation there is no labored breathing.  Heart is regular rate and rhythm without murmur gallop or rub she does not have significant lower extremity edema.  Abdomen is soft nontender with positive bowel sounds.  Musculoskeletal does move all extremities x4 but has continued right-sided weakness she is able to use a walker but says she guards her right leg somewhat.  She does have right upper extremity weakness as well.  Neurologic as noted above she does have right-sided weakness cranial nerves appear to be intact her speech is clear.  Psych she is pleasant and appropriate largely alert and oriented.   Labs reviewed:  Oct 14, 2019.  WBC 8.1 hemoglobin 13.2 platelets 193.  Sodium 145 potassium 4.2 BUN 20.5 creatinine 0.9.   Basic Metabolic Panel: Recent Labs    10/04/19 0526 10/05/19 0351 10/06/19 0514  NA 144 143 141  K 3.1* 3.4* 3.5  CL 109 107 108  CO2 26 28 25   GLUCOSE 108* 110* 100*  BUN 14 14 17   CREATININE 0.89 0.96 0.95  CALCIUM 9.3 9.2 8.9   Liver Function Tests: Recent Labs    09/30/19 1653 10/01/19 0347  AST 35 19  ALT 15 20  ALKPHOS 69 78  BILITOT 1.7* 0.9  PROT 6.7 6.9  ALBUMIN 3.8 3.6   No results for input(s): LIPASE, AMYLASE in the last 8760 hours. No results for input(s): AMMONIA in the last 8760 hours. CBC: Recent Labs    09/30/19 1653 09/30/19 1707 10/01/19 0347 10/02/19 0730 10/04/19 0526  WBC 7.6   < > 9.1 6.8 7.8  NEUTROABS 4.5  --   --   --   --   HGB 13.6   < > 12.9 12.9 13.4  HCT 41.8   < > 39.3 39.2  40.3  MCV 97.4   < > 97.0 96.3 95.3  PLT 253   < > 208 216 208   < > = values in this interval not displayed.   Cardiac Enzymes: No results for input(s): CKTOTAL, CKMB,  CKMBINDEX, TROPONINI in the last 8760 hours. BNP: Invalid input(s): POCBNP CBG: Recent Labs    10/05/19 0411 10/06/19 0753 10/06/19 1202  GLUCAP 99 125* 95    Procedures and Imaging Studies During Stay: CT Code Stroke CTA Head W/WO contrast  Result Date: 09/30/2019 CLINICAL DATA:  Altered mental status.  Weakness. EXAM: CT ANGIOGRAPHY HEAD AND NECK TECHNIQUE: Multidetector CT imaging of the head and neck was performed using the standard protocol during bolus administration of intravenous contrast. Multiplanar CT image reconstructions and MIPs were obtained to evaluate the vascular anatomy. Carotid stenosis measurements (when applicable) are obtained utilizing NASCET criteria, using the distal internal carotid diameter as the denominator. CONTRAST:  60mL OMNIPAQUE IOHEXOL 350 MG/ML SOLN COMPARISON:  None. FINDINGS: CTA NECK FINDINGS Aortic arch: Standard 3 vessel aortic arch with minimal atherosclerotic plaque. Widely patent arch vessel origins. Right carotid system: Patent with a moderate amount of predominantly calcified plaque at the carotid bifurcation. No evidence of significant stenosis or dissection. Tortuous proximal common carotid artery. Left carotid system: Patent with extensive calcified plaque at the carotid bifurcation resulting in less than 50% narrowing of the ICA origin. Tortuous proximal common carotid artery. Vertebral arteries: Patent and codominant without evidence of stenosis or dissection. Skeleton: Moderate diffuse cervical disc degeneration. Other neck: No evidence of cervical lymphadenopathy or mass. Upper chest: Clear lung apices. Review of the MIP images confirms the above findings CTA HEAD FINDINGS Anterior circulation: The internal carotid arteries are patent from skull base to carotid termini. Calcified plaque in the cavernous and proximal supraclinoid segments bilaterally does not result in significant stenosis. There is a punctate calcification at the left ICA terminus,  favored to be chronic as it was likely faintly visible on a 2006 head CT. This likely results in moderate left MCA origin stenosis and potentially severe left ACA origin stenosis. The left A1 segment is hypoplastic, and the left ACA is primarily supplied from the right via the anterior communicating artery. The right A1 segment is widely patent. Both MCAs are patent without evidence of proximal branch occlusion. There are mild left M1 and proximal right M2 stenoses. No aneurysm is identified. Posterior circulation: The intracranial vertebral arteries are patent to the basilar with calcified plaque resulting in mild proximal right V4 stenosis. Patent PICAs and SCAs are seen bilaterally. The basilar artery is patent and small in caliber congenitally. There are large posterior communicating arteries with hypoplastic right and absent left posterior communicating arteries. The PCAs are patent with branch vessel irregularity but no significant proximal stenosis. No aneurysm is identified. Venous sinuses: Poorly evaluated due to contrast timing. Anatomic variants: Predominantly fetal origin of the PCAs. Review of the MIP images confirms the above findings IMPRESSION: 1. No large vessel occlusion. 2. Suspected chronic calcification at the left ICA terminus resulting in ACA and MCA origin stenoses. 3. Mild distal right vertebral artery stenosis. 4. Left greater than right cervical carotid artery atherosclerosis without significant stenosis. These results were communicated to Dr. Wilford Corner at 5:20 pm on 09/30/2019 by text page via the [facility] messaging system. Electronically Signed   By: Sebastian Ache M.D.   On: 09/30/2019 17:34   DG Chest 1 View  Result Date: 09/30/2019 CLINICAL DATA:  Slurred speech EXAM: CHEST  1  VIEW COMPARISON:  06/07/2012 FINDINGS: The heart size and mediastinal contours are within normal limits. Both lungs are clear. The visualized skeletal structures are unremarkable. IMPRESSION: No active disease.  Electronically Signed   By: Jasmine Pang M.D.   On: 09/30/2019 22:51   CT Code Stroke CTA Neck W/WO contrast  Result Date: 09/30/2019 CLINICAL DATA:  Altered mental status.  Weakness. EXAM: CT ANGIOGRAPHY HEAD AND NECK TECHNIQUE: Multidetector CT imaging of the head and neck was performed using the standard protocol during bolus administration of intravenous contrast. Multiplanar CT image reconstructions and MIPs were obtained to evaluate the vascular anatomy. Carotid stenosis measurements (when applicable) are obtained utilizing NASCET criteria, using the distal internal carotid diameter as the denominator. CONTRAST:  60mL OMNIPAQUE IOHEXOL 350 MG/ML SOLN COMPARISON:  None. FINDINGS: CTA NECK FINDINGS Aortic arch: Standard 3 vessel aortic arch with minimal atherosclerotic plaque. Widely patent arch vessel origins. Right carotid system: Patent with a moderate amount of predominantly calcified plaque at the carotid bifurcation. No evidence of significant stenosis or dissection. Tortuous proximal common carotid artery. Left carotid system: Patent with extensive calcified plaque at the carotid bifurcation resulting in less than 50% narrowing of the ICA origin. Tortuous proximal common carotid artery. Vertebral arteries: Patent and codominant without evidence of stenosis or dissection. Skeleton: Moderate diffuse cervical disc degeneration. Other neck: No evidence of cervical lymphadenopathy or mass. Upper chest: Clear lung apices. Review of the MIP images confirms the above findings CTA HEAD FINDINGS Anterior circulation: The internal carotid arteries are patent from skull base to carotid termini. Calcified plaque in the cavernous and proximal supraclinoid segments bilaterally does not result in significant stenosis. There is a punctate calcification at the left ICA terminus, favored to be chronic as it was likely faintly visible on a 2006 head CT. This likely results in moderate left MCA origin stenosis and  potentially severe left ACA origin stenosis. The left A1 segment is hypoplastic, and the left ACA is primarily supplied from the right via the anterior communicating artery. The right A1 segment is widely patent. Both MCAs are patent without evidence of proximal branch occlusion. There are mild left M1 and proximal right M2 stenoses. No aneurysm is identified. Posterior circulation: The intracranial vertebral arteries are patent to the basilar with calcified plaque resulting in mild proximal right V4 stenosis. Patent PICAs and SCAs are seen bilaterally. The basilar artery is patent and small in caliber congenitally. There are large posterior communicating arteries with hypoplastic right and absent left posterior communicating arteries. The PCAs are patent with branch vessel irregularity but no significant proximal stenosis. No aneurysm is identified. Venous sinuses: Poorly evaluated due to contrast timing. Anatomic variants: Predominantly fetal origin of the PCAs. Review of the MIP images confirms the above findings IMPRESSION: 1. No large vessel occlusion. 2. Suspected chronic calcification at the left ICA terminus resulting in ACA and MCA origin stenoses. 3. Mild distal right vertebral artery stenosis. 4. Left greater than right cervical carotid artery atherosclerosis without significant stenosis. These results were communicated to Dr. Wilford Corner at 5:20 pm on 09/30/2019 by text page via the [facility] messaging system. Electronically Signed   By: Sebastian Ache M.D.   On: 09/30/2019 17:34   MR BRAIN WO CONTRAST  Result Date: 09/30/2019 CLINICAL DATA:  Possible stroke. EXAM: MRI HEAD WITHOUT CONTRAST TECHNIQUE: Multiplanar, multiecho pulse sequences of the brain and surrounding structures were obtained without intravenous contrast. COMPARISON:  Brain MRI 07/19/2005 FINDINGS: Brain: Small focus of abnormal diffusion restriction along the posteromedial left  temporal lobe. No other diffusion abnormality. Diffuse confluent  hyperintense T2-weighted signal within the periventricular, deep and juxtacortical white matter, most commonly due to chronic ischemic microangiopathy. There is generalized atrophy without lobar predilection. There are a few chronic microhemorrhages in a predominantly central distribution. Normal midline structures. Vascular: Normal flow voids. Skull and upper cervical spine: Normal marrow signal. Sinuses/Orbits: Negative. Other: None. IMPRESSION: 1. Small focus of abnormal diffusion restriction along the posteromedial left temporal lobe, consistent with a small acute infarct. 2. Severe chronic ischemic microangiopathy and generalized atrophy. Chronic micro 3. Chronic microhemorrhage within a predominantly central distribution, likely indicating chronic hypertensive angiopathy. Electronically Signed   By: Deatra RobinsonKevin  Herman M.D.   On: 09/30/2019 23:42   DG Swallowing Func-Speech Pathology  Result Date: 10/03/2019 Objective Swallowing Evaluation: Type of Study: MBS-Modified Barium Swallow Study  Patient Details Name: Larita FifeJanie M Pikus MRN: [id number] Date of Birth: 04/12/1934 Today's Date: 10/03/2019 Time: SLP Start Time (ACUTE ONLY): 0900 -SLP Stop Time (ACUTE ONLY): 0920 SLP Time Calculation (min) (ACUTE ONLY): 20 min Past Medical History: Past Medical History: Diagnosis Date . Depression  . GERD (gastroesophageal reflux disease)  . Hypertension  . Stroke (cerebrum) [facility](HCC)  Past Surgical History: Past Surgical History: Procedure Laterality Date . ABDOMINAL HYSTERECTOMY   . GALLBLADDER SURGERY   HPI: Pt is 86 y.o. female with medical history of stroke with residual right-sided weakness, HTN, GERD, depression who presented with slurred speech and altered mental status. MRI revealed small abnormal diffusion restriction along the posteromedial L temporal lobe consistent with small acute infarct. CXR no active disease.  No data recorded Assessment / Plan / Recommendation CHL IP CLINICAL IMPRESSIONS 10/03/2019 Clinical  Impression Pt exhibited mild pharyngeal dysphagia suspected to be mostly chronic possibly exacerbated by acute stroke. She reports intermittent coughing with liquids "for years". There was min-mild vallecular and pyriform sinus residue possibly due to inefficient pharyngeal contraction (?cervical rigidity- cervical calcificaiton observed). Thin barium residue from lateral channels and pyriform sinuses spilled into her laryngeal vestibule after the swallow and fell below the vocal cords eliciting reflexive cough however did not fully clear as barium seen in trachea. Supraglottic breath hold strategy was not effective. Chin tuck would thought to be counterintuitive given pyriform sinues residue however it increased coordination and protection 90% of the time. Esophageal scan did not reveal overt abnormalities (MBS does not diagnose below the level of the UES). Recommend regular texture, thin liquids (chin tuck with liquids only), meds whole in puree, throat clear during meals.      SLP Visit Diagnosis Dysphagia, pharyngeal phase (R13.13) Attention and concentration deficit following -- Frontal lobe and executive function deficit following -- Impact on safety and function --   CHL IP TREATMENT RECOMMENDATION 10/03/2019 Treatment Recommendations Therapy as outlined in treatment plan below   Prognosis 10/03/2019 Prognosis for Safe Diet Advancement Good Barriers to Reach Goals -- Barriers/Prognosis Comment -- CHL IP DIET RECOMMENDATION 10/03/2019 SLP Diet Recommendations Regular solids;Thin liquid Liquid Administration via Cup;No straw Medication Administration Whole meds with puree Compensations Slow rate;Small sips/bites;Chin tuck;Other (Comment) Postural Changes Seated upright at 90 degrees;Remain semi-upright after after feeds/meals (Comment)   CHL IP OTHER RECOMMENDATIONS 10/03/2019 Recommended Consults -- Oral Care Recommendations Oral care BID Other Recommendations --   CHL IP FOLLOW UP RECOMMENDATIONS 10/03/2019 Follow  up Recommendations None   CHL IP FREQUENCY AND DURATION 10/03/2019 Speech Therapy Frequency (ACUTE ONLY) min 2x/week Treatment Duration 2 weeks      CHL IP ORAL PHASE 10/03/2019 Oral Phase WFL Oral -  Pudding Teaspoon -- Oral - Pudding Cup -- Oral - Honey Teaspoon -- Oral - Honey Cup -- Oral - Nectar Teaspoon -- Oral - Nectar Cup -- Oral - Nectar Straw -- Oral - Thin Teaspoon -- Oral - Thin Cup -- Oral - Thin Straw -- Oral - Puree -- Oral - Mech Soft -- Oral - Regular -- Oral - Multi-Consistency -- Oral - Pill -- Oral Phase - Comment --  CHL IP PHARYNGEAL PHASE 10/03/2019 Pharyngeal Phase Impaired Pharyngeal- Pudding Teaspoon -- Pharyngeal -- Pharyngeal- Pudding Cup -- Pharyngeal -- Pharyngeal- Honey Teaspoon -- Pharyngeal -- Pharyngeal- Honey Cup -- Pharyngeal -- Pharyngeal- Nectar Teaspoon -- Pharyngeal -- Pharyngeal- Nectar Cup WFL Pharyngeal -- Pharyngeal- Nectar Straw -- Pharyngeal -- Pharyngeal- Thin Teaspoon -- Pharyngeal -- Pharyngeal- Thin Cup Pharyngeal residue - valleculae;Penetration/Aspiration during swallow Pharyngeal Material enters airway, remains ABOVE vocal cords then ejected out Pharyngeal- Thin Straw Pharyngeal residue - valleculae;Pharyngeal residue - pyriform;Penetration/Aspiration during swallow;Penetration/Apiration after swallow Pharyngeal Material enters airway, passes BELOW cords and not ejected out despite cough attempt by patient Pharyngeal- Puree -- Pharyngeal -- Pharyngeal- Mechanical Soft -- Pharyngeal -- Pharyngeal- Regular WFL Pharyngeal -- Pharyngeal- Multi-consistency -- Pharyngeal -- Pharyngeal- Pill -- Pharyngeal -- Pharyngeal Comment --  CHL IP CERVICAL ESOPHAGEAL PHASE 10/03/2019 Cervical Esophageal Phase WFL Pudding Teaspoon -- Pudding Cup -- Honey Teaspoon -- Honey Cup -- Nectar Teaspoon -- Nectar Cup -- Nectar Straw -- Thin Teaspoon -- Thin Cup -- Thin Straw -- Puree -- Mechanical Soft -- Regular -- Multi-consistency -- Pill -- Cervical Esophageal Comment -- Houston Siren  10/03/2019, 1:46 PM  Orbie Pyo Colvin Caroli.Ed Actor Pager [phone] Office [phone]             ECHOCARDIOGRAM COMPLETE BUBBLE STUDY  Result Date: 10/01/2019    ECHOCARDIOGRAM REPORT   Patient Name:   DENENE ALAMILLO [facility] Date of Exam: 10/01/2019 Medical Rec #:  [id number]         Height:       62.0 in Accession #:    [id number]        Weight:       153.2 lb Date of Birth:  1933-08-03         BSA:          1.707 m Patient Age:    20 years          BP:           169/72 mmHg Patient Gender: F                 HR:           63 bpm. Exam Location:  Inpatient Procedure: 2D Echo and Saline Contrast Bubble Study Indications:    Stroke  History:        Patient has no prior history of Echocardiogram examinations.                 Risk Factors:Hypertension.  Sonographer:    Mikki Santee RDCS (AE) Referring Phys: [id number] NA LI IMPRESSIONS  1. Left ventricular ejection fraction, by estimation, is 70 to 75%. The left ventricle has hyperdynamic function. The left ventricle has no regional wall motion abnormalities. Left ventricular diastolic parameters are consistent with Grade I diastolic dysfunction (impaired relaxation).  2. Right ventricular systolic function is normal. The right ventricular size is normal. Tricuspid regurgitation signal is inadequate for assessing PA pressure.  3. The mitral valve is grossly normal. No evidence of mitral valve regurgitation. No evidence of  mitral stenosis.  4. The aortic valve is tricuspid. Aortic valve regurgitation is not visualized. No aortic stenosis is present.  5. The inferior vena cava is normal in size with greater than 50% respiratory variability, suggesting right atrial pressure of 3 mmHg.  6. Agitated saline contrast bubble study was negative, with no evidence of any interatrial shunt. Conclusion(s)/Recommendation(s): No intracardiac source of embolism detected on this transthoracic study. A transesophageal echocardiogram is recommended to exclude  cardiac source of embolism if clinically indicated. FINDINGS  Left Ventricle: Left ventricular ejection fraction, by estimation, is 70 to 75%. The left ventricle has hyperdynamic function. The left ventricle has no regional wall motion abnormalities. The left ventricular internal cavity size was normal in size. There is no left ventricular hypertrophy. Left ventricular diastolic parameters are consistent with Grade I diastolic dysfunction (impaired relaxation). Right Ventricle: The right ventricular size is normal. No increase in right ventricular wall thickness. Right ventricular systolic function is normal. Tricuspid regurgitation signal is inadequate for assessing PA pressure. Left Atrium: Left atrial size was normal in size. Right Atrium: Right atrial size was normal in size. Pericardium: Trivial pericardial effusion is present. Presence of pericardial fat pad. Mitral Valve: The mitral valve is grossly normal. No evidence of mitral valve regurgitation. No evidence of mitral valve stenosis. Tricuspid Valve: The tricuspid valve is grossly normal. Tricuspid valve regurgitation is not demonstrated. No evidence of tricuspid stenosis. Aortic Valve: The aortic valve is tricuspid. Aortic valve regurgitation is not visualized. No aortic stenosis is present. Pulmonic Valve: The pulmonic valve was grossly normal. Pulmonic valve regurgitation is not visualized. No evidence of pulmonic stenosis. Aorta: The aortic root and ascending aorta are structurally normal, with no evidence of dilitation. Venous: The inferior vena cava is normal in size with greater than 50% respiratory variability, suggesting right atrial pressure of 3 mmHg. IAS/Shunts: The atrial septum is grossly normal. Agitated saline contrast was given intravenously to evaluate for intracardiac shunting. Agitated saline contrast bubble study was negative, with no evidence of any interatrial shunt.  LEFT VENTRICLE PLAX 2D LVIDd:         3.60 cm  Diastology LVIDs:          2.40 cm  LV e' lateral:   6.22 cm/s LV PW:         1.00 cm  LV E/e' lateral: 10.0 LV IVS:        1.00 cm  LV e' medial:    5.33 cm/s LVOT diam:     1.80 cm  LV E/e' medial:  11.7 LV SV:         68 LV SV Index:   40 LVOT Area:     2.54 cm  RIGHT VENTRICLE RV S prime:     16.60 cm/s TAPSE (M-mode): 2.1 cm LEFT ATRIUM             Index       RIGHT ATRIUM           Index LA diam:        2.30 cm 1.35 cm/m  RA Area:     10.10 cm LA Vol (A2C):   46.1 ml 27.01 ml/m RA Volume:   18.20 ml  10.66 ml/m LA Vol (A4C):   44.2 ml 25.89 ml/m LA Biplane Vol: 45.1 ml 26.42 ml/m  AORTIC VALVE LVOT Vmax:   124.00 cm/s LVOT Vmean:  96.900 cm/s LVOT VTI:    0.268 m  AORTA Ao Root diam: 2.60 cm MITRAL VALVE MV Area (PHT):  1.65 cm     SHUNTS MV Decel Time: 461 msec     Systemic VTI:  0.27 m MV E velocity: 62.10 cm/s   Systemic Diam: 1.80 cm MV A velocity: 101.00 cm/s MV E/A ratio:  0.61 Lennie Odor MD Electronically signed by Lennie Odor MD Signature Date/Time: 10/01/2019/3:47:30 PM    Final    CT HEAD CODE STROKE WO CONTRAST  Result Date: 09/30/2019 CLINICAL DATA:  Code stroke. Possible stroke, ataxia, stroke suspected. Additional history provided: Last known well 1300 EXAM: CT HEAD WITHOUT CONTRAST TECHNIQUE: Contiguous axial images were obtained from the base of the skull through the vertex without intravenous contrast. COMPARISON:  Brain MRI 07/19/2005, brain MRI 08/01/2004, head CT 07/31/2004 FINDINGS: Brain: There is no evidence of acute intracranial hemorrhage. Ischemic changes within the left lentiform nucleus, progressed as compared to MRI 07/19/2005, but otherwise age indeterminate. Redemonstrated chronic lacunar infarct within the left corona radiata. Background advanced chronic small vessel ischemic disease and moderate generalized parenchymal atrophy, also progressed. There is no evidence of intracranial mass. No midline shift or extra-axial fluid collection. Vascular: No hyperdense vessel.  Atherosclerotic  calcifications. Skull: Normal. Negative for fracture or focal lesion. Sinuses/Orbits: Visualized orbits demonstrate no acute abnormality. No significant paranasal sinus disease or mastoid effusion at the imaged levels. ASPECTS (Alberta Stroke Program Early CT Score) - Ganglionic level infarction (caudate, lentiform nuclei, internal capsule, insula, M1-M3 cortex): 6 (point subtracted for age-indeterminate ischemic changes within the left lentiform nucleus) - Supraganglionic infarction (M4-M6 cortex): 3 Total score (0-10 with 10 being normal): 9 These results were called by telephone at the time of interpretation on 09/30/2019 at 5:14 pm to provider Dr. Wilford Corner, who verbally acknowledged these results. IMPRESSION: 1. Age-indeterminate ischemic changes within the left lentiform nucleus. Aspects 9. 2. No evidence of acute intracranial hemorrhage or acute demarcated cortical infarct. 3. Advanced chronic small vessel ischemic disease has progressed as compared to prior MRI 07/19/2005. Redemonstrated chronic lacunar infarct within the left corona radiata. 4. Moderate generalized parenchymal atrophy. Electronically Signed   By: Jackey Loge DO   On: 09/30/2019 17:17    Assessment/Plan:    #1 history of CVA at the left posterior cerebral artery-thought to be of thrombus-recommendation for dual antiplatelet therapy with aspirin and Plavix for 3 weeks-she has completed her Plavix and continues on aspirin 81 mg a day.-She also continues on Lipitor 40 mg a day  She appears to have done well with therapy will need a hemiwalker to assist with ambulation as well as continued PT and OT.  2.  History of hypertension-this appears to be stable on current medications including Norvasc 10 mg a day and hydralazine 10 mg 4 times a day-recent blood pressures 120/67-126/70.  3.  History of GERD this appears to be stable on Protonix 40 mg a day.  4.  History of hyperlipidemi she is on Lipitor 40 mg a day-lipid panel October 01, 2019  showed an LDL of 111-this will warrant follow-up primary care provider..  I also note she is on cholestyramine once a day as well  5.  History of overactive bladder she is Myrbetriq 50 mg extended release daily this appears to be stable.  Again she will need expedient primary care follow-up as well as PT and OT and a hemiwalker to assist with ambulation.  [id number] note greater than 30 minutes spent on this discharge summary-greater than 50% of time spent coordinating formulating a plan of care for numerous diagnoses

## 2019-10-23 MED ORDER — MIRABEGRON ER 50 MG PO TB24: 50.0000 mg | ORAL_TABLET | Freq: Every day | ORAL | 0 refills | Status: AC

## 2019-10-23 MED ORDER — VITAMIN D (ERGOCALCIFEROL) 1.25 MG (50000 UNIT) PO CAPS: 50000.0000 [IU] | ORAL_CAPSULE | ORAL | 0 refills | Status: AC

## 2019-10-23 MED ORDER — BENZONATATE 100 MG PO CAPS: 100.0000 mg | ORAL_CAPSULE | Freq: Three times a day (TID) | ORAL | 0 refills | Status: AC | PRN

## 2019-10-23 MED ORDER — AMLODIPINE BESYLATE 10 MG PO TABS: 10.0000 mg | ORAL_TABLET | Freq: Every day | ORAL | 0 refills | Status: AC

## 2019-10-23 MED ORDER — HYDRALAZINE HCL 10 MG PO TABS: 10.0000 mg | ORAL_TABLET | Freq: Four times a day (QID) | ORAL | 0 refills | Status: AC

## 2019-10-23 MED ORDER — CHOLESTYRAMINE LIGHT 4 G PO PACK: 4.0000 g | PACK | Freq: Every day | ORAL | 0 refills | Status: AC

## 2019-10-23 MED ORDER — ATORVASTATIN CALCIUM 40 MG PO TABS: 40.0000 mg | ORAL_TABLET | Freq: Every day | ORAL | 0 refills | Status: AC

## 2019-10-23 MED ORDER — PANTOPRAZOLE SODIUM 40 MG PO TBEC: 40.0000 mg | DELAYED_RELEASE_TABLET | Freq: Every day | ORAL | 0 refills | Status: AC

## 2019-10-24 ENCOUNTER — Encounter: Payer: Self-pay | Admitting: Internal Medicine

## 2019-10-29 DIAGNOSIS — E785 Hyperlipidemia, unspecified: Secondary | ICD-10-CM | POA: Diagnosis not present

## 2019-10-29 DIAGNOSIS — I10 Essential (primary) hypertension: Secondary | ICD-10-CM | POA: Diagnosis not present

## 2019-10-29 DIAGNOSIS — I6300 Cerebral infarction due to thrombosis of unspecified precerebral artery: Secondary | ICD-10-CM | POA: Diagnosis not present

## 2019-10-31 DIAGNOSIS — N3281 Overactive bladder: Secondary | ICD-10-CM | POA: Diagnosis not present

## 2019-10-31 DIAGNOSIS — Z7982 Long term (current) use of aspirin: Secondary | ICD-10-CM | POA: Diagnosis not present

## 2019-10-31 DIAGNOSIS — K219 Gastro-esophageal reflux disease without esophagitis: Secondary | ICD-10-CM | POA: Diagnosis not present

## 2019-10-31 DIAGNOSIS — I69351 Hemiplegia and hemiparesis following cerebral infarction affecting right dominant side: Secondary | ICD-10-CM | POA: Diagnosis not present

## 2019-10-31 DIAGNOSIS — I10 Essential (primary) hypertension: Secondary | ICD-10-CM | POA: Diagnosis not present

## 2019-10-31 DIAGNOSIS — I69328 Other speech and language deficits following cerebral infarction: Secondary | ICD-10-CM | POA: Diagnosis not present

## 2019-10-31 DIAGNOSIS — E785 Hyperlipidemia, unspecified: Secondary | ICD-10-CM | POA: Diagnosis not present

## 2019-10-31 DIAGNOSIS — Z9181 History of falling: Secondary | ICD-10-CM | POA: Diagnosis not present

## 2019-10-31 DIAGNOSIS — F329 Major depressive disorder, single episode, unspecified: Secondary | ICD-10-CM | POA: Diagnosis not present

## 2019-11-02 DIAGNOSIS — K219 Gastro-esophageal reflux disease without esophagitis: Secondary | ICD-10-CM | POA: Diagnosis not present

## 2019-11-02 DIAGNOSIS — N3281 Overactive bladder: Secondary | ICD-10-CM | POA: Diagnosis not present

## 2019-11-02 DIAGNOSIS — E785 Hyperlipidemia, unspecified: Secondary | ICD-10-CM | POA: Diagnosis not present

## 2019-11-02 DIAGNOSIS — F329 Major depressive disorder, single episode, unspecified: Secondary | ICD-10-CM | POA: Diagnosis not present

## 2019-11-02 DIAGNOSIS — I69328 Other speech and language deficits following cerebral infarction: Secondary | ICD-10-CM | POA: Diagnosis not present

## 2019-11-02 DIAGNOSIS — I10 Essential (primary) hypertension: Secondary | ICD-10-CM | POA: Diagnosis not present

## 2019-11-02 DIAGNOSIS — Z7982 Long term (current) use of aspirin: Secondary | ICD-10-CM | POA: Diagnosis not present

## 2019-11-02 DIAGNOSIS — Z9181 History of falling: Secondary | ICD-10-CM | POA: Diagnosis not present

## 2019-11-02 DIAGNOSIS — I69351 Hemiplegia and hemiparesis following cerebral infarction affecting right dominant side: Secondary | ICD-10-CM | POA: Diagnosis not present

## 2019-11-03 ENCOUNTER — Other Ambulatory Visit: Payer: Self-pay

## 2019-11-03 ENCOUNTER — Ambulatory Visit: Payer: PPO | Admitting: Adult Health

## 2019-11-03 ENCOUNTER — Encounter: Payer: Self-pay | Admitting: Adult Health

## 2019-11-03 VITALS — BP 137/69 | HR 66 | Ht 62.0 in | Wt 145.0 lb

## 2019-11-03 DIAGNOSIS — I10 Essential (primary) hypertension: Secondary | ICD-10-CM

## 2019-11-03 DIAGNOSIS — I63532 Cerebral infarction due to unspecified occlusion or stenosis of left posterior cerebral artery: Secondary | ICD-10-CM

## 2019-11-03 DIAGNOSIS — E785 Hyperlipidemia, unspecified: Secondary | ICD-10-CM

## 2019-11-03 DIAGNOSIS — I6930 Unspecified sequelae of cerebral infarction: Secondary | ICD-10-CM

## 2019-11-03 NOTE — Progress Notes (Signed)
[facility] [address] [place]. [place] [address] [phone]       HOSPITAL FOLLOW UP NOTE  Alyssa Adams Date of Birth:  January 13, 1934 Medical Record Number:  [id number]   Reason for Referral:  hospital stroke follow up    SUBJECTIVE:   CHIEF COMPLAINT:  Chief Complaint  Patient presents with  . Follow-up    pt here for a f/u from a stroke. Pt is having no new sx. Pt is with her daughter    HPI:   Alyssa Adams is a 86 y.o. female with history of stroke w/ R sided deficits, and HTN who presented on 09/30/2019 with AMS and slurred speech.  Stroke work-up revealed small left PCA infarct likely secondary to small vessel disease source.  CTA head/neck no evidence of large vessel occlusion, but with chronic calcification in the left ICA and ACA and MCA origin stenosis, as well as mild distal right vertebral artery stenosis.  No antithrombotic prior to admission and recommended DAPT for 3 weeks and aspirin alone.  Prior history of stroke 07/2004 periventricular left parietal infarct started on Aggrenox with residual right hemiparesis and progressive memory decline.  Arrived in hypertensive emergency with BP 196/79 stabilized during admission with long-term BP goal normotensive range.  LDL 111 and initiated atorvastatin 40 mg daily.  Other stroke risk factors include advanced age, small vessel disease, intracranial stenosis and overweight.  Other active problems baseline poor short-term memory, GERD, depression and hypokalemia.  Evaluated by therapies who recommended CIR versus SNF and was eventually discharged to SNF for ongoing therapy needs.  Stroke: Small L PCA infarct likely secondary to small vessel disease source  Code Stroke CT head age indeterminate L lentiform nucleus infarct. Advanced Small vessel disease. Atrophy. Old L corona radiata lacune. ASPECTS 9    CTA head & neck no LVO. L ICA terminus calcification w/ ACA and MCA origin stenoses. Mild  distal R VA stenosis. L>R ICA atherosclerosis.   MRI  Posteromedial L temporal lobe infarct. Severe small vessel disease. Atrophy. Chronic microhemorrhage  2D Echo EF 70-75%  LDL 111  HgbA1c 5.6  SCDs for VTE prophylaxis  No antithrombotids prior to admission, now on aspirin 81 mg daily and clopidogrel 75 mg daily. Continue DAPT x 3 weeks then aspirin alone   Therapy recommendations:   SNF versus CIR  Disposition: SNF  Today, 11/03/2019, Alyssa Adams is being seen for hospital follow up accompanied by her daughter. She has been doing well since discharge and has since returned home from skilled nursing rehab.  Daughter reports returned to baseline without residual speech deficits, worsening baseline right-sided weakness or cognitive impairment.  She has recently started home health therapies.  She lives in her own home with aide assistance during the day and daughter stays with her at night. Ambulates with RW in the home - w/c for long distance. She does complain of lower back pain which is new since being in the hospital and daughter questions if this could be due to atorvastatin as she has a history of statin intolerance.  Completed 3 weeks DAPT and continues on aspirin alone without bleeding or bruising.  Blood pressure today 137/69.  No concerns at this time.     ROS:   14 system review of systems performed and negative with exception of joint pain, weakness, gait impairment, short-term memory loss  PMH:  Past Medical History:  Diagnosis Date  . Depression   . GERD (gastroesophageal reflux disease)   .  Hypertension   . Stroke (cerebrum) (HCC)     PSH:  Past Surgical History:  Procedure Laterality Date  . ABDOMINAL HYSTERECTOMY    . GALLBLADDER SURGERY      Social History:  Social History   Socioeconomic History  . Marital status: Married    Spouse name: Not on file  . Number of children: Not on file  . Years of education: Not on file  . Highest education level:  Not on file  Occupational History  . Not on file  Tobacco Use  . Smoking status: Never Smoker  . Smokeless tobacco: Never Used  Substance and Sexual Activity  . Alcohol use: No  . Drug use: Not on file  . Sexual activity: Not on file  Other Topics Concern  . Not on file  Social History Narrative   Patient lives at home by herself and a caregiver comes during the day and her daughter stays with her at nighttime.   Social Determinants of Health   Financial Resource Strain:   . Difficulty of Paying Living Expenses:   Food Insecurity:   . Worried About Charity fundraiser in the Last Year:   . Arboriculturist in the Last Year:   Transportation Needs:   . Film/video editor (Medical):   Marland Kitchen Lack of Transportation (Non-Medical):   Physical Activity:   . Days of Exercise per Week:   . Minutes of Exercise per Session:   Stress:   . Feeling of Stress :   Social Connections:   . Frequency of Communication with Friends and Family:   . Frequency of Social Gatherings with Friends and Family:   . Attends Religious Services:   . Active Member of Clubs or Organizations:   . Attends Archivist Meetings:   Marland Kitchen Marital Status:   Intimate Partner Violence:   . Fear of Current or Ex-Partner:   . Emotionally Abused:   Marland Kitchen Physically Abused:   . Sexually Abused:     Family History:  Family History  Problem Relation Age of Onset  . Kidney cancer Father   . Prostate cancer Neg Hx     Medications:   Current Outpatient Medications on File Prior to Visit  Medication Sig Dispense Refill  . amLODipine (NORVASC) 10 MG tablet Take 1 tablet (10 mg total) by mouth daily. 30 tablet 0  . aspirin EC 81 MG EC tablet Take 1 tablet (81 mg total) by mouth daily.    Marland Kitchen atorvastatin (LIPITOR) 40 MG tablet Take 1 tablet (40 mg total) by mouth daily. 30 tablet 0  . benzonatate (TESSALON PERLES) 100 MG capsule Take 1 capsule (100 mg total) by mouth 3 (three) times daily as needed. 30 capsule 0  .  cholestyramine light (PREVALITE) 4 g packet Take 1 packet (4 g total) by mouth daily before breakfast. 42 each 0  . hydrALAZINE (APRESOLINE) 10 MG tablet Take 1 tablet (10 mg total) by mouth 4 (four) times daily. 120 tablet 0  . mirabegron ER (MYRBETRIQ) 50 MG TB24 tablet Take 1 tablet (50 mg total) by mouth daily. 30 tablet 0  . Nutritional Supplements (BOOST BREEZE PO) Boost Breeze qd d/t poor appetite at admission.    . pantoprazole (PROTONIX) 40 MG tablet Take 1 tablet (40 mg total) by mouth daily. 30 tablet 0  . Vitamin D, Ergocalciferol, (DRISDOL) 1.25 MG (50000 UNIT) CAPS capsule Take 1 capsule (50,000 Units total) by mouth every 7 (seven) days. 4 capsule 0  No current facility-administered medications on file prior to visit.    Allergies:   Allergies  Allergen Reactions  . Erythromycin Ethylsuccinate Other (See Comments)    Reaction not recalled by the patient  . Sulfa Antibiotics Other (See Comments)    Reaction not recalled by the patient      OBJECTIVE:  Physical Exam  Vitals:   11/03/19 1302  BP: 137/69  Pulse: 66  Weight: 145 lb (65.8 kg)  Height: 5\' 2"  (1.575 m)   Body mass index is 26.52 kg/m. No exam data present New General: well developed, well nourished,  pleasant elderly Caucasian female, seated, in no evident distress Head: head normocephalic and atraumatic.   Neck: supple with no carotid or supraclavicular bruits Cardiovascular: regular rate and rhythm, no murmurs Musculoskeletal: no deformity Skin:  no rash/petichiae Vascular:  Normal pulses all extremities   Neurologic Exam Mental Status: Awake and fully alert. Fluent speech and language. Oriented to place and time. Recent memory diminished and remote memory intact. Attention span, concentration and fund of knowledge mostly appropriate during visit. Mood and affect appropriate.  Cranial Nerves: Fundoscopic exam reveals sharp disc margins. Pupils equal, briskly reactive to light. Extraocular  movements full without nystagmus. Visual fields full to confrontation. Hearing intact. Facial sensation intact. Face, tongue, palate moves normally and symmetrically.  Motor: Normal strength and tone left upper and lower extremity; chronic 4/5 right spastic hemiparesis with increased tone intrinsic finger muscles limiting full ROM Sensory.: intact to touch , pinprick , position and vibratory sensation.  Coordination: Rapid alternating movements normal in all extremities except inability right hand finger tap. Finger-to-nose and heel-to-shin performed accurately on left side. Gait and Station: Deferred as patient in wheelchair and rolling walker not present during visit Reflexes: 2+ RUE and RLE, 1+ LUE and LLE. Toes downgoing.     NIHSS  0 Modified Rankin  3      ASSESSMENT: Alyssa Adams is a 86 y.o. year old female presented with AMS and and slurred speech on 09/30/2019 with stroke work-up revealing small left PCA infarct likely secondary to small vessel disease source. Vascular risk factors include HTN, HLD, intracranial stenosis, severe small vessel disease and history of left parietal infarct 07/2004 with residual right hemiparesis and memory decline.  Recovered well from recent stroke without residual deficit.  Chronic stroke deficits of right hemiparesis and cognitive impairment stable.     PLAN:  1. Left PCA stroke:  -Continue aspirin 81 mg daily for secondary stroke prevention.   -Recommend holding atorvastatin for 3 days to see if lower back pain improves.  If improves, will recommend discontinuing atorvastatin and initiate Crestor 10 mg daily.  If no improvement of lower back pain, will recommend restarting atorvastatin and to continue to follow with PCP for prescribing, monitoring and management -Maintain strict control of hypertension with blood pressure goal below 130/90, diabetes with hemoglobin A1c goal below 6.5% and cholesterol with LDL cholesterol (bad cholesterol) goal  below 70 mg/dL.  I also advised the patient to eat a healthy diet with plenty of whole grains, cereals, fruits and vegetables, exercise regularly with at least 30 minutes of continuous activity daily and maintain ideal body weight. 2. HTN: Stable.  Continue to follow with PCP for monitoring and management 3. HLD: As above   Follow up in 4 months or call earlier if needed   I spent 45 minutes of face-to-face and non-face-to-face time with patient and daughter.  This included previsit chart review, lab review, study review,  order entry, electronic health record documentation, patient education regarding recent stroke, residual deficits, importance of managing stroke risk factors and answered all questions to patient satisfaction     Ihor Austin, [facility]  [facility] [address] [place], [place] [address]  Phone [phone] Fax [phone] Note: This document was prepared with digital dictation and possible smart phrase technology. Any transcriptional errors that result from this process are unintentional.

## 2019-11-03 NOTE — Patient Instructions (Addendum)
Start therapies at home and if needed, may consider doing additional outpatient therapies if you would like  Continue aspirin 81 mg daily for secondary stroke prevention  Recommend holding atorvastatin for the next 3 days to see if back pain resolves. If it resolves, would recommend trial Crestor 10mg  daily for cholesterol management and stroke prevention - please keep updated   Continue to follow up with PCP regarding cholesterol and blood pressure management   Continue to monitor blood pressure at home  Maintain strict control of hypertension with blood pressure goal below 130/90, diabetes with hemoglobin A1c goal below 6.5% and cholesterol with LDL cholesterol (bad cholesterol) goal below 70 mg/dL. I also advised the patient to eat a healthy diet with plenty of whole grains, cereals, fruits and vegetables, exercise regularly and maintain ideal body weight.  Followup in the future with me in 4 months or call earlier if needed       Thank you for coming to see [place] at [facility]. I hope we have been able to provide you high quality care today.  You may receive a patient satisfaction survey over the next few weeks. We would appreciate your feedback and comments so that we may continue to improve ourselves and the health of our patients.

## 2019-11-03 NOTE — Progress Notes (Signed)
I agree with the above plan 

## 2019-11-04 DIAGNOSIS — I69328 Other speech and language deficits following cerebral infarction: Secondary | ICD-10-CM | POA: Diagnosis not present

## 2019-11-04 DIAGNOSIS — I10 Essential (primary) hypertension: Secondary | ICD-10-CM | POA: Diagnosis not present

## 2019-11-04 DIAGNOSIS — F329 Major depressive disorder, single episode, unspecified: Secondary | ICD-10-CM | POA: Diagnosis not present

## 2019-11-04 DIAGNOSIS — Z9181 History of falling: Secondary | ICD-10-CM | POA: Diagnosis not present

## 2019-11-04 DIAGNOSIS — I69351 Hemiplegia and hemiparesis following cerebral infarction affecting right dominant side: Secondary | ICD-10-CM | POA: Diagnosis not present

## 2019-11-04 DIAGNOSIS — Z7982 Long term (current) use of aspirin: Secondary | ICD-10-CM | POA: Diagnosis not present

## 2019-11-04 DIAGNOSIS — K219 Gastro-esophageal reflux disease without esophagitis: Secondary | ICD-10-CM | POA: Diagnosis not present

## 2019-11-04 DIAGNOSIS — E785 Hyperlipidemia, unspecified: Secondary | ICD-10-CM | POA: Diagnosis not present

## 2019-11-04 DIAGNOSIS — N3281 Overactive bladder: Secondary | ICD-10-CM | POA: Diagnosis not present

## 2019-11-11 DIAGNOSIS — I69351 Hemiplegia and hemiparesis following cerebral infarction affecting right dominant side: Secondary | ICD-10-CM | POA: Diagnosis not present

## 2019-11-11 DIAGNOSIS — Z9181 History of falling: Secondary | ICD-10-CM | POA: Diagnosis not present

## 2019-11-11 DIAGNOSIS — I10 Essential (primary) hypertension: Secondary | ICD-10-CM | POA: Diagnosis not present

## 2019-11-11 DIAGNOSIS — Z7982 Long term (current) use of aspirin: Secondary | ICD-10-CM | POA: Diagnosis not present

## 2019-11-11 DIAGNOSIS — I69328 Other speech and language deficits following cerebral infarction: Secondary | ICD-10-CM | POA: Diagnosis not present

## 2019-11-11 DIAGNOSIS — N3281 Overactive bladder: Secondary | ICD-10-CM | POA: Diagnosis not present

## 2019-11-11 DIAGNOSIS — K219 Gastro-esophageal reflux disease without esophagitis: Secondary | ICD-10-CM | POA: Diagnosis not present

## 2019-11-11 DIAGNOSIS — E785 Hyperlipidemia, unspecified: Secondary | ICD-10-CM | POA: Diagnosis not present

## 2019-11-11 DIAGNOSIS — F329 Major depressive disorder, single episode, unspecified: Secondary | ICD-10-CM | POA: Diagnosis not present

## 2019-11-17 DIAGNOSIS — N3281 Overactive bladder: Secondary | ICD-10-CM | POA: Diagnosis not present

## 2019-11-17 DIAGNOSIS — I10 Essential (primary) hypertension: Secondary | ICD-10-CM | POA: Diagnosis not present

## 2019-11-17 DIAGNOSIS — K219 Gastro-esophageal reflux disease without esophagitis: Secondary | ICD-10-CM | POA: Diagnosis not present

## 2019-11-17 DIAGNOSIS — Z7982 Long term (current) use of aspirin: Secondary | ICD-10-CM | POA: Diagnosis not present

## 2019-11-17 DIAGNOSIS — Z9181 History of falling: Secondary | ICD-10-CM | POA: Diagnosis not present

## 2019-11-17 DIAGNOSIS — I69351 Hemiplegia and hemiparesis following cerebral infarction affecting right dominant side: Secondary | ICD-10-CM | POA: Diagnosis not present

## 2019-11-17 DIAGNOSIS — I69328 Other speech and language deficits following cerebral infarction: Secondary | ICD-10-CM | POA: Diagnosis not present

## 2019-11-17 DIAGNOSIS — F329 Major depressive disorder, single episode, unspecified: Secondary | ICD-10-CM | POA: Diagnosis not present

## 2019-11-17 DIAGNOSIS — E785 Hyperlipidemia, unspecified: Secondary | ICD-10-CM | POA: Diagnosis not present

## 2019-11-21 ENCOUNTER — Other Ambulatory Visit: Payer: Self-pay | Admitting: Internal Medicine

## 2019-11-27 ENCOUNTER — Other Ambulatory Visit: Payer: Self-pay | Admitting: Internal Medicine

## 2019-12-01 ENCOUNTER — Other Ambulatory Visit: Payer: Self-pay | Admitting: Internal Medicine

## 2020-01-29 DIAGNOSIS — I10 Essential (primary) hypertension: Secondary | ICD-10-CM | POA: Diagnosis not present

## 2020-01-29 DIAGNOSIS — M25473 Effusion, unspecified ankle: Secondary | ICD-10-CM | POA: Diagnosis not present

## 2020-01-29 DIAGNOSIS — I679 Cerebrovascular disease, unspecified: Secondary | ICD-10-CM | POA: Diagnosis not present

## 2020-01-29 DIAGNOSIS — F329 Major depressive disorder, single episode, unspecified: Secondary | ICD-10-CM | POA: Diagnosis not present

## 2020-03-08 ENCOUNTER — Ambulatory Visit: Payer: PPO | Admitting: Adult Health

## 2020-05-03 DIAGNOSIS — I10 Essential (primary) hypertension: Secondary | ICD-10-CM | POA: Diagnosis not present

## 2020-05-03 DIAGNOSIS — F32A Depression, unspecified: Secondary | ICD-10-CM | POA: Diagnosis not present

## 2020-05-03 DIAGNOSIS — I679 Cerebrovascular disease, unspecified: Secondary | ICD-10-CM | POA: Diagnosis not present

## 2020-05-03 DIAGNOSIS — E559 Vitamin D deficiency, unspecified: Secondary | ICD-10-CM | POA: Diagnosis not present

## 2020-05-03 DIAGNOSIS — E785 Hyperlipidemia, unspecified: Secondary | ICD-10-CM | POA: Diagnosis not present

## 2020-06-11 ENCOUNTER — Other Ambulatory Visit: Payer: Self-pay

## 2020-06-11 ENCOUNTER — Emergency Department
Admission: EM | Admit: 2020-06-11 | Discharge: 2020-06-11 | Disposition: A | Discharge status: Home / Self Care | Payer: PPO | Attending: Emergency Medicine | Admitting: Emergency Medicine

## 2020-06-11 ENCOUNTER — Emergency Department: Payer: PPO

## 2020-06-11 DIAGNOSIS — I10 Essential (primary) hypertension: Secondary | ICD-10-CM | POA: Insufficient documentation

## 2020-06-11 DIAGNOSIS — W1789XA Other fall from one level to another, initial encounter: Secondary | ICD-10-CM | POA: Insufficient documentation

## 2020-06-11 DIAGNOSIS — Y92009 Unspecified place in unspecified non-institutional (private) residence as the place of occurrence of the external cause: Secondary | ICD-10-CM

## 2020-06-11 DIAGNOSIS — W19XXXA Unspecified fall, initial encounter: Secondary | ICD-10-CM

## 2020-06-11 DIAGNOSIS — M545 Low back pain, unspecified: Secondary | ICD-10-CM

## 2020-06-11 DIAGNOSIS — R52 Pain, unspecified: Secondary | ICD-10-CM | POA: Diagnosis not present

## 2020-06-11 DIAGNOSIS — Z79899 Other long term (current) drug therapy: Secondary | ICD-10-CM | POA: Diagnosis not present

## 2020-06-11 DIAGNOSIS — Z7982 Long term (current) use of aspirin: Secondary | ICD-10-CM | POA: Insufficient documentation

## 2020-06-11 DIAGNOSIS — M549 Dorsalgia, unspecified: Secondary | ICD-10-CM | POA: Diagnosis not present

## 2020-06-11 DIAGNOSIS — M25519 Pain in unspecified shoulder: Secondary | ICD-10-CM | POA: Diagnosis not present

## 2020-06-11 MED ORDER — LIDOCAINE 5 % EX PTCH
1.0000 | MEDICATED_PATCH | CUTANEOUS | Status: DC
  Administered 2020-06-11: 1 via TRANSDERMAL
  Filled 2020-06-11: qty 1

## 2020-06-11 MED ORDER — LIDOCAINE 5 % EX PTCH: 1.0000 | MEDICATED_PATCH | CUTANEOUS | 0 refills | Status: AC

## 2020-06-11 NOTE — ED Triage Notes (Signed)
Pt denies LOC. Reports walker collapsed making pt fall.

## 2020-06-11 NOTE — Discharge Instructions (Signed)
No acute findings on x-ray of the lumbar spine.  Continue previous medications.  Advised to use Lidoderm patches as directed.  Follow-up PCP.

## 2020-06-11 NOTE — ED Triage Notes (Signed)
FIRST NURSE NOTE:  Pt arrived via [facility] from home, fall last night, could not get to phone until this morning,  C/o low back pain and neck tenderness.  BP 170/83   A/O x 4 with EMS.

## 2020-06-11 NOTE — ED Triage Notes (Signed)
Pt presents via POV c/o fall last PM. Reports fell x2 yesterday. No c/o lower back pain. Pt reports hx CVA and hx of high fall risk due to same. Reports uses walker for mobility. A&Ox4.

## 2020-06-11 NOTE — ED Provider Notes (Signed)
[facility] Emergency Department Provider Note   ____________________________________________   Event Date/Time   First MD Initiated Contact with Patient 06/11/20 1125     (approximate)  I have reviewed the triage vital signs and the nursing notes.   HISTORY  Chief Complaint Fall and Back Pain    HPI Alyssa Adams is a 86 y.o. female patient complaint low back pain secondary to mechanical fall which occurred yesterday.  Patient states her walker collapsed causing her to fall backwards.  Patient denies LOC or head injury.  Patient has history of CVA and is a high risk for falls secondary to right hemiplegic.  patient uses a walker for mobility.  Patient denies bladder or bowel dysfunction.  Patient denies chest or abdominal pain.  Patient denies vision disturbance or vertigo.  Patient does not take blood thinners.  Takes daily aspirin.        Past Medical History:  Diagnosis Date  . Depression   . GERD (gastroesophageal reflux disease)   . Hypertension   . Stroke (cerebrum) [facility])     Patient Active Problem List   Diagnosis Date Noted  . Hyperlipidemia 10/08/2019  . Overactive bladder 10/08/2019  . Cerebrovascular accident (CVA) due to thrombosis of left posterior cerebral artery (HCC)   . Hypertensive emergency 09/30/2019  . History of stroke in adulthood 09/30/2019  . Essential hypertension 09/30/2019  . GERD (gastroesophageal reflux disease) 09/30/2019  . Depression 09/30/2019    Past Surgical History:  Procedure Laterality Date  . ABDOMINAL HYSTERECTOMY    . GALLBLADDER SURGERY      Prior to Admission medications   Medication Sig Start Date End Date Taking? Authorizing Provider  lidocaine (LIDODERM) 5 % Place 1 patch onto the skin daily. Remove & Discard patch within 12 hours or as directed by MD 06/11/20 06/11/21 Yes Joni Reining, PA-C  amLODipine (NORVASC) 10 MG tablet Take 1 tablet (10 mg total) by mouth daily. 10/23/19    Roena Malady, PA-C  aspirin EC 81 MG EC tablet Take 1 tablet (81 mg total) by mouth daily. 10/07/19   Elgergawy, Leana Roe, MD  atorvastatin (LIPITOR) 40 MG tablet Take 1 tablet (40 mg total) by mouth daily. 10/23/19   Edmon Crape C, PA-C  benzonatate (TESSALON PERLES) 100 MG capsule Take 1 capsule (100 mg total) by mouth 3 (three) times daily as needed. 10/23/19   Edmon Crape C, PA-C  cholestyramine light (PREVALITE) 4 g packet Take 1 packet (4 g total) by mouth daily before breakfast. 10/23/19   Edmon Crape C, PA-C  hydrALAZINE (APRESOLINE) 10 MG tablet Take 1 tablet (10 mg total) by mouth 4 (four) times daily. 10/23/19   Edmon Crape C, PA-C  mirabegron ER (MYRBETRIQ) 50 MG TB24 tablet Take 1 tablet (50 mg total) by mouth daily. 10/23/19   Edmon Crape C, PA-C  Nutritional Supplements (BOOST BREEZE PO) Boost Breeze qd d/t poor appetite at admission.    [provider]  pantoprazole (PROTONIX) 40 MG tablet Take 1 tablet (40 mg total) by mouth daily. 10/23/19   Edmon Crape C, PA-C  Vitamin D, Ergocalciferol, (DRISDOL) 1.25 MG (50000 UNIT) CAPS capsule Take 1 capsule (50,000 Units total) by mouth every 7 (seven) days. 10/23/19   Edmon Crape C, PA-C    Allergies Erythromycin ethylsuccinate and Sulfa antibiotics  Family History  Problem Relation Age of Onset  . Kidney cancer Father   . Prostate cancer Neg Hx     Social History Social  History   Tobacco Use  . Smoking status: Never Smoker  . Smokeless tobacco: Never Used  Substance Use Topics  . Alcohol use: No    Review of Systems Constitutional: No fever/chills Eyes: No visual changes. ENT: No sore throat. Cardiovascular: Denies chest pain. Respiratory: Denies shortness of breath. Gastrointestinal: No abdominal pain.  No nausea, no vomiting.  No diarrhea.  No constipation. Genitourinary: Negative for dysuria. Musculoskeletal: Positive for back pain. Skin: Negative for rash. Neurological:  Right hemiplegic secondary to  CVA. Psychiatric:  Depression Endocrine:  Hyperlipidemia and hypertension Allergic/Immunilogical: Erythromycin and sulfa  ____________________________________________   PHYSICAL EXAM:  VITAL SIGNS: ED Triage Vitals  Enc Vitals Group     BP 06/11/20 1016 (!) 155/67     Pulse Rate 06/11/20 1016 93     Resp 06/11/20 1016 16     Temp 06/11/20 1016 98.6 F (37 C)     Temp Source 06/11/20 1016 Oral     SpO2 06/11/20 1016 100 %     Weight 06/11/20 1017 135 lb (61.2 kg)     Height 06/11/20 1017 5\' 3"  (1.6 m)     Head Circumference --      Peak Flow --      Pain Score 06/11/20 1027 7     Pain Loc --      Pain Edu? --      Excl. in GC? --     Constitutional: Alert and oriented. Well appearing and in no acute distress. Eyes: Conjunctivae are normal. PERRL. EOMI. Head: Atraumatic. Nose: No congestion/rhinnorhea. Mouth/Throat: Mucous membranes are moist.  Oropharynx non-erythematous. Neck: No cervical spine tenderness to palpation. Hematological/Lymphatic/Immunilogical: No cervical lymphadenopathy. Cardiovascular: Normal rate, regular rhythm. Grossly normal heart sounds.  Good peripheral circulation.  Elevated blood pressure Respiratory: Normal respiratory effort.  No retractions. Lungs CTAB. Gastrointestinal: Soft and nontender. No distention. No abdominal bruits. No CVA tenderness. Genitourinary: Deferred Musculoskeletal: Leg length discrepancy.  Hip is stable.  No lower extremity guarding.  No obvious joint effusion.  Neurovascular intact. Neurologic:  Normal speech and language. No gross focal neurologic deficits are appreciated. No gait instability. Skin:  Skin is warm, dry and intact. No rash noted.  No abrasion or ecchymosis. Psychiatric: Mood and affect are normal. Speech and behavior are normal.  ____________________________________________   LABS (all labs ordered are listed, but only abnormal results are displayed)  Labs Reviewed - No data to  display ____________________________________________  EKG   ____________________________________________  RADIOLOGY I, 06/13/20, personally viewed and evaluated these images (plain radiographs) as part of my medical decision making, as well as reviewing the written report by the radiologist.  ED MD interpretation: No acute findings on x-ray of the lumbar spine. Official radiology report(s): DG Lumbar Spine 2-3 Views  Result Date: 06/11/2020 CLINICAL DATA:  Low back pain after fall 1 day ago. EXAM: LUMBAR SPINE - 2-3 VIEW COMPARISON:  09/10/2018 FINDINGS: No evidence of acute fracture or subluxation. Stable degenerative disc disease throughout the lumbar spine, most significantly at the L3-4 and L4-5 levels. No bony lesions. Stable calcified plaque in the abdominal aorta. IMPRESSION: 1. No acute findings. Stable degenerative disc disease. 2. Aortic atherosclerosis. Electronically Signed   By: 09/12/2018 M.D.   On: 06/11/2020 12:55    ____________________________________________   PROCEDURES  Procedure(s) performed (including Critical Care):  Procedures   ____________________________________________   INITIAL IMPRESSION / ASSESSMENT AND PLAN / ED COURSE  As part of my medical decision making, I reviewed the following data  within the electronic MEDICAL RECORD NUMBER         Patient presents with low back pain secondary to a fall.  Discussed x-ray findings with patient.  Patient's mother will try Lidoderm patch while waiting for x-rays.  Patient given discharge care instructions.  Patient advised continue previous medication use Lidoderm patches as directed.  Follow-up with PCP.      ____________________________________________   FINAL CLINICAL IMPRESSION(S) / ED DIAGNOSES  Final diagnoses:  Fall in home, initial encounter  Acute midline low back pain without sciatica     ED Discharge Orders         Ordered    lidocaine (LIDODERM) 5 %  Every 24 hours         06/11/20 1305          *Please note:  Alyssa Adams was evaluated in Emergency Department on 06/11/2020 for the symptoms described in the history of present illness. She was evaluated in the context of the global COVID-19 pandemic, which necessitated consideration that the patient might be at risk for infection with the SARS-CoV-2 virus that causes COVID-19. Institutional protocols and algorithms that pertain to the evaluation of patients at risk for COVID-19 are in a state of rapid change based on information released by regulatory bodies including the CDC and federal and state organizations. These policies and algorithms were followed during the patient's care in the ED.  Some ED evaluations and interventions may be delayed as a result of limited staffing during and the pandemic.*   Note:  This document was prepared using Dragon voice recognition software and may include unintentional dictation errors.    Joni Reining, PA-C 06/11/20 1307    Dionne Bucy, MD 06/11/20 [phone]

## 2020-06-17 DIAGNOSIS — I10 Essential (primary) hypertension: Secondary | ICD-10-CM | POA: Diagnosis not present

## 2020-06-17 DIAGNOSIS — R110 Nausea: Secondary | ICD-10-CM | POA: Diagnosis not present

## 2020-06-17 DIAGNOSIS — I679 Cerebrovascular disease, unspecified: Secondary | ICD-10-CM | POA: Diagnosis not present

## 2020-10-04 ENCOUNTER — Telehealth: Payer: Self-pay | Admitting: Adult Health Nurse Practitioner

## 2020-10-04 NOTE — Telephone Encounter (Signed)
Returned call to daughter, Milas Kocher, and she was in agreement with scheduling Palliative visit with NP.  I have scheduled an In-home Consult for 10/05/20 @ 11 AM.

## 2020-10-05 ENCOUNTER — Other Ambulatory Visit: Payer: PPO | Admitting: Adult Health Nurse Practitioner

## 2020-10-05 ENCOUNTER — Other Ambulatory Visit: Payer: Self-pay

## 2020-10-05 VITALS — BP 148/80 | HR 71

## 2020-10-05 DIAGNOSIS — Z515 Encounter for palliative care: Secondary | ICD-10-CM

## 2020-10-05 DIAGNOSIS — E46 Unspecified protein-calorie malnutrition: Secondary | ICD-10-CM | POA: Diagnosis not present

## 2020-10-05 DIAGNOSIS — I69351 Hemiplegia and hemiparesis following cerebral infarction affecting right dominant side: Secondary | ICD-10-CM

## 2020-10-05 NOTE — Progress Notes (Signed)
[facility] Palliative Care Consult Note Telephone: [phone]  Fax: [phone]    Date of encounter: 10/05/20 PATIENT NAME: Alyssa Adams [address] [place] [address]   [phone] (home)  DOB: Mar 21, 1934 MRN: [id number] PRIMARY CARE PROVIDER:    Maryland Pink, MD,  [place] [address] [phone]  REFERRING PROVIDER:   Maryland Pink, MD [address] [place],  [place] [address] [phone]  RESPONSIBLE PARTY:    Contact Information    Name Relation Home Work Mobile   [place] Daughter [phone]         I met face to face with patient and family in home. Palliative Care was asked to follow this patient by consultation request of  Maryland Pink, MD to address advance care planning and complex medical decision making. This is the initial visit.   Daughter present during visit today.                                   ASSESSMENT AND PLAN / RECOMMENDATIONS:   Advance Care Planning/Goals of Care: Goals include to maximize quality of life and symptom management.  Patient has advanced directives and DNR uploaded to epic.  Daughter could not find copy of DNR in home.  Filled out new DNR and left in home today.  CODE STATUS: DNR  Symptom Management/Plan:  PCM: Family and caregivers do have to help feed her at times.  They encourage her at mealtimes but she is still only eating up to 25% of her meals.  Recommendation for hospice, see below  Right hemiplegia s/p CVA: Possible that patient had another stroke with fall in December 2021.  Patient has had drastic functional status decline.  Patient would benefit with hospital bed for easier transfers and for personal care.  Discussed hospice and daughter would be interested in hospice support if eligible.  Discussed placement in facility as it is getting harder to take care of her at home.  We will reach out to hospice  physicians for hospice eligibility.  Discussed with daughter that if hospice eligible they can help with hospital bed and have a social worker on the team to help with possible placement.  If not hospice eligible at this time, I will reach out to PCP for recommendation of hospital bed I will put in social work referral for possible placement   Follow up Palliative Care Visit: Palliative care will continue to follow for complex medical decision making, advance care planning, and clarification of goals.  Will call back with status of hospice eligibility.  We will set up next appointment and submit SW referral if not hospice eligible at this time.  I spent 75 minutes providing this consultation. More than 50% of the time in this consultation was spent in counseling and care coordination.  PPS: 30%  HOSPICE ELIGIBILITY/DIAGNOSIS: Yes/deficits s/p CVA, PCM  Chief Complaint: initial palliative visit  HISTORY OF PRESENT ILLNESS:  Alyssa Adams is a 87 y.o. year old female  with right hemiplegia s/p CVA, depression, GERD, HTN, HLD, OAB.  Daughter does state that her mother had a stroke 15 years ago resulting in right hemiplegia.  She has done well at home until her husband passed away in 2019-04-01.  Daughter states that she also had a CVA in 2021 in which she spent 3 weeks in rehab.  Daughter  does state that she has slowly gotten weaker and weaker.  She does have a caregiver that comes into the home 9-12: 8.  Daughter helps out a lot in the home as well.  On 06/11/2020 patient was seen in ED after a fall at home.  Patient was evaluated for any fractures with no acute findings.  Family feels as if she may have had another stroke but no work-up for stroke was done at the ED that day.  Prior to the fall patient was able to walk with a walker and able to assist some with ADLs.  Patient is now chair bound and requires assistance with transfers and standing.  Patient requires assistance with ADLs except  feeding.  She is continent of bowel and bladder and can let caregivers know when she needs to go to the bathroom.  She does have bladder leaks due to overactive bladder.  Daughter states that her mother's appetite is not good and that she is only eating about 25% of what she used to eat.  Unable to weigh patient as she is unable to stand by herself.  Daughter has noticed that her clothes are looser.  She does get occasional nausea which is relieved with Dramamine.  Daughter states that she sleeps a lot and estimates that she sleeps about 60% in a 24-hour period.  Patient used to have issues with diarrhea and is on cholestyramine for this.  Daughter has noticed that she has been more constipated since the fall in December which could be due to inactivity and decreased fiber and water intake.  Daughter does not give her the cholestyramine if she has not had a bowel movement.  Daughter does point out some bruising to bilateral shins that appear to be mostly resolved.  She also has bruising to right hand.  Daughter does not recall any trauma.  Daughter does state that her mother has also had a cognitive decline and is becoming more forgetful.  Rest of 10 point ROS asked and negative except what is stated in HPI.  History obtained from review of EMR and interview with family and Alyssa Adams.  I reviewed available labs, medications, imaging, studies and related documents from the EMR.  Records reviewed and summarized above.   Physical Exam:  Constitutional: NAD General: frail appearing, thin  EYES: anicteric sclera, lids intact, no discharge  ENMT: hard of  hearing, oral mucous membranes moist CV: S1S2, RRR, no LE edema Pulmonary: LCTA, no increased work of breathing, no cough Abdomen: normo-active BS + 4 quadrants, soft and non tender MSK: non ambulatory; contractures of right hand Skin: warm and dry, areas of ecchymosis of varying stages of healing Neuro:  A&O to person and place; no facial drooping,  no slurred speech Psych: non-anxious affect   CURRENT PROBLEM LIST:  Patient Active Problem List   Diagnosis Date Noted  . Hyperlipidemia 10/08/2019  . Overactive bladder 10/08/2019  . Cerebrovascular accident (CVA) due to thrombosis of left posterior cerebral artery ([place])   . Hypertensive emergency 09/30/2019  . History of stroke in adulthood 09/30/2019  . Essential hypertension 09/30/2019  . GERD (gastroesophageal reflux disease) 09/30/2019  . Depression 09/30/2019   PAST MEDICAL HISTORY:  Active Ambulatory Problems    Diagnosis Date Noted  . Hypertensive emergency 09/30/2019  . History of stroke in adulthood 09/30/2019  . Essential hypertension 09/30/2019  . GERD (gastroesophageal reflux disease) 09/30/2019  . Depression 09/30/2019  . Cerebrovascular accident (CVA) due to thrombosis of left posterior cerebral artery (  [place])   . Hyperlipidemia 10/08/2019  . Overactive bladder 10/08/2019   Resolved Ambulatory Problems    Diagnosis Date Noted  . No Resolved Ambulatory Problems   Past Medical History:  Diagnosis Date  . Hypertension   . Stroke (cerebrum) [facility])    SOCIAL HX:  Social History   Tobacco Use  . Smoking status: Never Smoker  . Smokeless tobacco: Never Used  Substance Use Topics  . Alcohol use: No   FAMILY HX:  Family History  Problem Relation Age of Onset  . Kidney cancer Father   . Prostate cancer Neg Hx       ALLERGIES:  Allergies  Allergen Reactions  . Erythromycin Ethylsuccinate Other (See Comments)    Reaction not recalled by the patient  . Sulfa Antibiotics Other (See Comments)    Reaction not recalled by the patient     PERTINENT MEDICATIONS:  Outpatient Encounter Medications as of 10/05/2020  Medication Sig  . amLODipine (NORVASC) 10 MG tablet Take 1 tablet (10 mg total) by mouth daily.  Marland Kitchen aspirin EC 81 MG EC tablet Take 1 tablet (81 mg total) by mouth daily.  Marland Kitchen atorvastatin (LIPITOR) 40 MG tablet Take 1 tablet (40 mg total) by mouth  daily.  . benzonatate (TESSALON PERLES) 100 MG capsule Take 1 capsule (100 mg total) by mouth 3 (three) times daily as needed.  . cholestyramine light (PREVALITE) 4 g packet Take 1 packet (4 g total) by mouth daily before breakfast.  . hydrALAZINE (APRESOLINE) 10 MG tablet Take 1 tablet (10 mg total) by mouth 4 (four) times daily.  Marland Kitchen lidocaine (LIDODERM) 5 % Place 1 patch onto the skin daily. Remove & Discard patch within 12 hours or as directed by MD  . mirabegron ER (MYRBETRIQ) 50 MG TB24 tablet Take 1 tablet (50 mg total) by mouth daily.  . Nutritional Supplements (BOOST BREEZE PO) Boost Breeze qd d/t poor appetite at admission.  . pantoprazole (PROTONIX) 40 MG tablet Take 1 tablet (40 mg total) by mouth daily.  . Vitamin D, Ergocalciferol, (DRISDOL) 1.25 MG (50000 UNIT) CAPS capsule Take 1 capsule (50,000 Units total) by mouth every 7 (seven) days.   No facility-administered encounter medications on file as of 10/05/2020.    Thank you for the opportunity to participate in the care of Ms. Kirchman.  The palliative care team will continue to follow. Please call our office at [phone] if we can be of additional assistance.    Jenetta Downer, NP , DNP  This chart was dictated using voice recognition software. Despite best efforts to proofread, errors can occur which can change the documentation meaning.   COVID-19 PATIENT SCREENING TOOL Asked and negative response unless otherwise noted:   Have you had symptoms of covid, tested positive or been in contact with someone with symptoms/positive test in the past 5-10 days? negative

## 2020-10-07 ENCOUNTER — Telehealth: Payer: Self-pay | Admitting: Adult Health Nurse Practitioner

## 2020-10-07 NOTE — Telephone Encounter (Signed)
Spoke with patient's daughter about hospice eligibility.  She would like to proceed with hospice.   Have reached out to PCP with request for hospice referral and if he will be attending. [place] Cros[place] K. Garner Nash NP

## 2021-01-10 DIAGNOSIS — F419 Anxiety disorder, unspecified: Secondary | ICD-10-CM | POA: Diagnosis not present

## 2021-01-10 DIAGNOSIS — I10 Essential (primary) hypertension: Secondary | ICD-10-CM | POA: Diagnosis not present

## 2021-01-10 DIAGNOSIS — F0150 Vascular dementia without behavioral disturbance: Secondary | ICD-10-CM | POA: Diagnosis not present

## 2021-01-10 DIAGNOSIS — I69351 Hemiplegia and hemiparesis following cerebral infarction affecting right dominant side: Secondary | ICD-10-CM | POA: Diagnosis not present

## 2021-01-10 DIAGNOSIS — F32A Depression, unspecified: Secondary | ICD-10-CM | POA: Diagnosis not present

## 2021-01-10 DIAGNOSIS — I69318 Other symptoms and signs involving cognitive functions following cerebral infarction: Secondary | ICD-10-CM | POA: Diagnosis not present

## 2021-01-10 DIAGNOSIS — E785 Hyperlipidemia, unspecified: Secondary | ICD-10-CM | POA: Diagnosis not present

## 2021-04-05 DIAGNOSIS — F01511 Vascular dementia, unspecified severity, with agitation: Secondary | ICD-10-CM | POA: Diagnosis not present

## 2021-04-05 DIAGNOSIS — F0153 Vascular dementia, unspecified severity, with mood disturbance: Secondary | ICD-10-CM | POA: Diagnosis not present

## 2021-04-05 DIAGNOSIS — I69351 Hemiplegia and hemiparesis following cerebral infarction affecting right dominant side: Secondary | ICD-10-CM | POA: Diagnosis not present

## 2021-04-05 DIAGNOSIS — E785 Hyperlipidemia, unspecified: Secondary | ICD-10-CM | POA: Diagnosis not present

## 2021-04-05 DIAGNOSIS — F0154 Vascular dementia, unspecified severity, with anxiety: Secondary | ICD-10-CM | POA: Diagnosis not present

## 2021-04-05 DIAGNOSIS — I69318 Other symptoms and signs involving cognitive functions following cerebral infarction: Secondary | ICD-10-CM | POA: Diagnosis not present

## 2021-04-05 DIAGNOSIS — I10 Essential (primary) hypertension: Secondary | ICD-10-CM | POA: Diagnosis not present

## 2021-06-10 DIAGNOSIS — E785 Hyperlipidemia, unspecified: Secondary | ICD-10-CM | POA: Diagnosis not present

## 2021-06-10 DIAGNOSIS — F0153 Vascular dementia, unspecified severity, with mood disturbance: Secondary | ICD-10-CM | POA: Diagnosis not present

## 2021-06-10 DIAGNOSIS — I69318 Other symptoms and signs involving cognitive functions following cerebral infarction: Secondary | ICD-10-CM | POA: Diagnosis not present

## 2021-06-10 DIAGNOSIS — I69351 Hemiplegia and hemiparesis following cerebral infarction affecting right dominant side: Secondary | ICD-10-CM | POA: Diagnosis not present

## 2021-06-10 DIAGNOSIS — I10 Essential (primary) hypertension: Secondary | ICD-10-CM | POA: Diagnosis not present

## 2021-06-10 DIAGNOSIS — F0154 Vascular dementia, unspecified severity, with anxiety: Secondary | ICD-10-CM | POA: Diagnosis not present

## 2021-06-10 DIAGNOSIS — F01511 Vascular dementia, unspecified severity, with agitation: Secondary | ICD-10-CM | POA: Diagnosis not present

## 2021-08-10 DEATH — deceased

## 2021-10-27 IMAGING — CT CT ANGIO NECK
2 of 7 series · 8 of 33 positions shown · IV contrast (APPLIED)
Comparison: None.

CLINICAL DATA: Altered mental status.  Weakness.

EXAM:
CT ANGIOGRAPHY HEAD AND NECK
TECHNIQUE: Multidetector CT imaging of the head and neck was performed using
the standard protocol during bolus administration of intravenous
contrast. Multiplanar CT image reconstructions and MIPs were
obtained to evaluate the vascular anatomy. Carotid stenosis
measurements (when applicable) are obtained utilizing NASCET
criteria, using the distal internal carotid diameter as the
denominator.
CONTRAST:  60mL OMNIPAQUE IOHEXOL 350 MG/ML SOLN

[Series 5: cta neck/head · axial · 0.50mm/px · z∈[-10,+98]mm · 2 of 162 slices shown]
[im 54/162  soft-tissue]
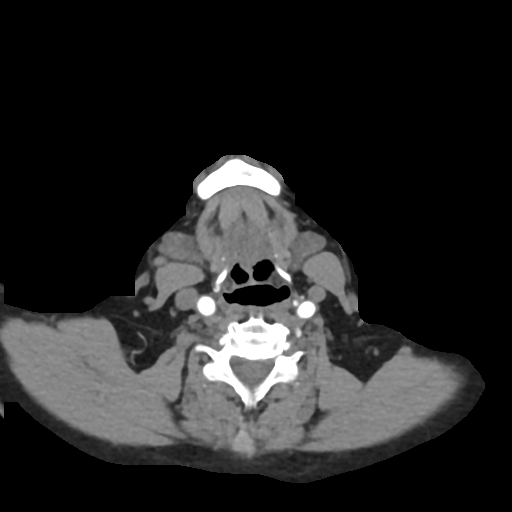
[im 108/162  soft-tissue]
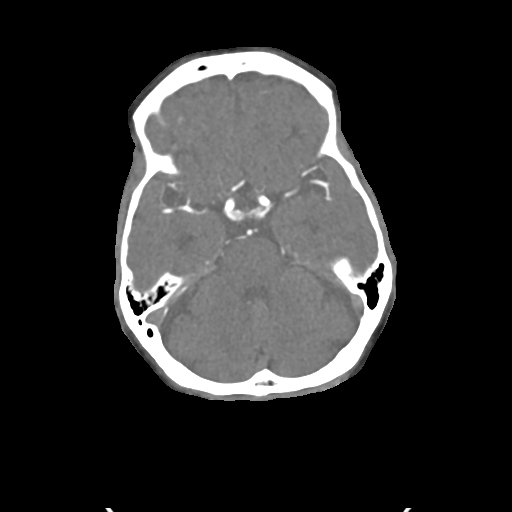

[Series 7: ax thins · axial · 0.39mm/px · z∈[-71,+159]mm · 6 of 322 slices shown]
[im 46/322  soft-tissue]
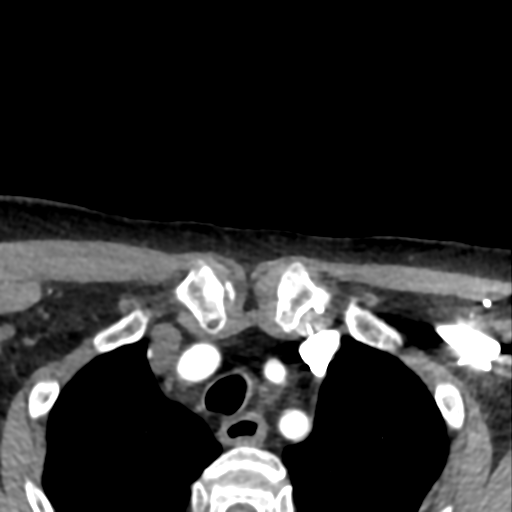
[im 92/322  bone]
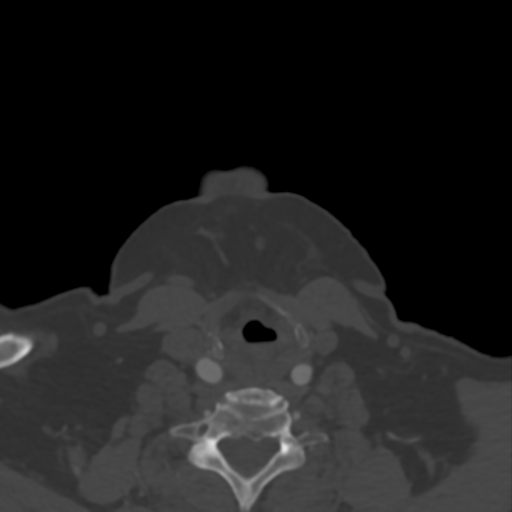
[im 138/322  soft-tissue]
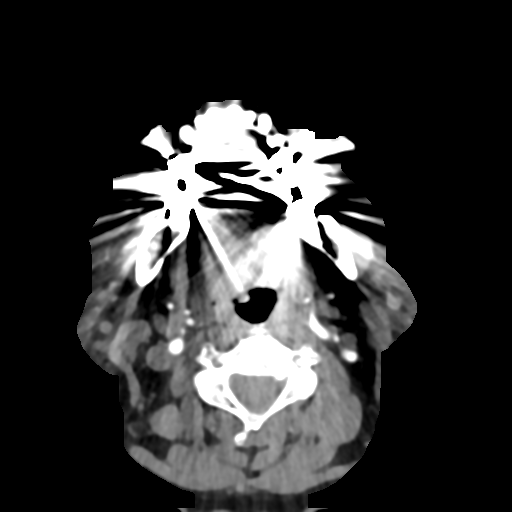
[im 184/322  bone]
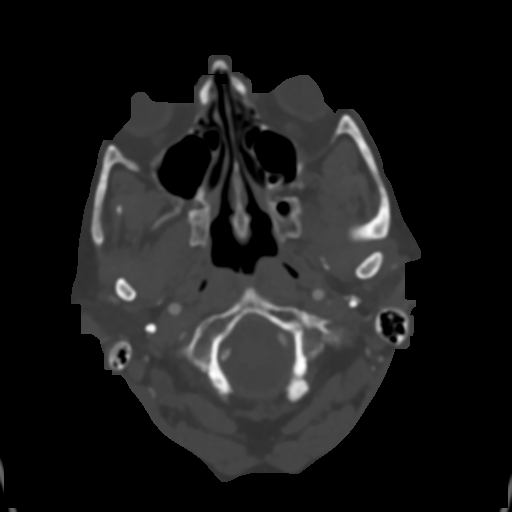
[im 230/322  soft-tissue]
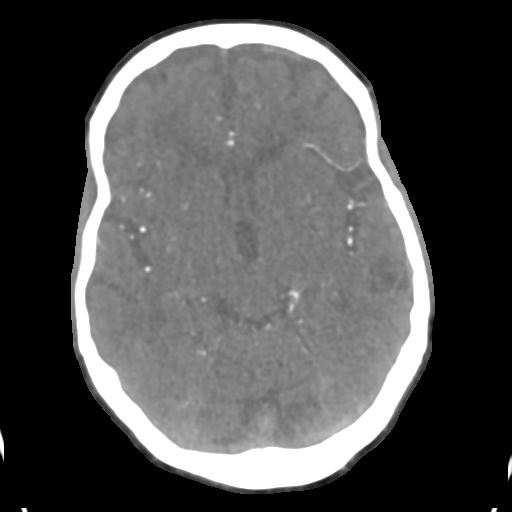
[im 276/322  bone]
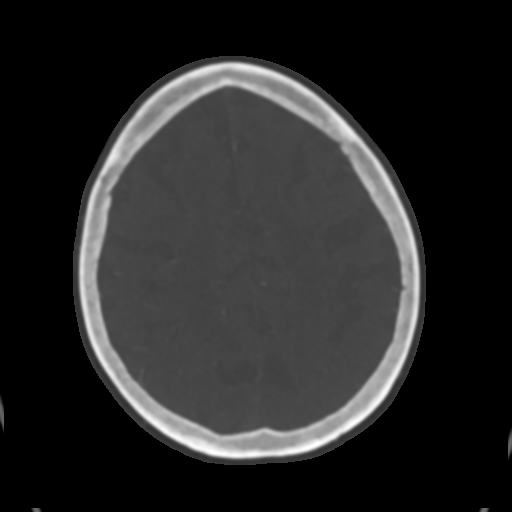

[8 of 33 positions shown; findings below may reference images not displayed]

FINDINGS: CTA NECK FINDINGS

Aortic arch: Standard 3 vessel aortic arch with minimal
atherosclerotic plaque. Widely patent arch vessel origins.

Right carotid system: Patent with a moderate amount of predominantly
calcified plaque at the carotid bifurcation. No evidence of
significant stenosis or dissection. Tortuous proximal common carotid
artery.

Left carotid system: Patent with extensive calcified plaque at the
carotid bifurcation resulting in less than 50% narrowing of the ICA
origin. Tortuous proximal common carotid artery.

Vertebral arteries: Patent and codominant without evidence of
stenosis or dissection.

Skeleton: Moderate diffuse cervical disc degeneration.

Other neck: No evidence of cervical lymphadenopathy or mass.

Upper chest: Clear lung apices.

Review of the MIP images confirms the above findings

CTA HEAD FINDINGS

Anterior circulation: The internal carotid arteries are patent from
skull base to carotid termini. Calcified plaque in the cavernous and
proximal supraclinoid segments bilaterally does not result in
significant stenosis. There is a punctate calcification at the left
ICA terminus, favored to be chronic as it was likely faintly visible
on a 2332 head CT. This likely results in moderate left MCA origin
stenosis and potentially severe left ACA origin stenosis. The left
A1 segment is hypoplastic, and the left ACA is primarily supplied
from the right via the anterior communicating artery. The right A1
segment is widely patent. Both MCAs are patent without evidence of
proximal branch occlusion. There are mild left M1 and proximal right
M2 stenoses. No aneurysm is identified.

Posterior circulation: The intracranial vertebral arteries are
patent to the basilar with calcified plaque resulting in mild
proximal right V4 stenosis. Patent PICAs and SCAs are seen
bilaterally. The basilar artery is patent and small in caliber
congenitally. There are large posterior communicating arteries with
hypoplastic right and absent left posterior communicating arteries.
The PCAs are patent with branch vessel irregularity but no
significant proximal stenosis. No aneurysm is identified.

Venous sinuses: Poorly evaluated due to contrast timing.

Anatomic variants: Predominantly fetal origin of the PCAs.

Review of the MIP images confirms the above findings
IMPRESSION: 1. No large vessel occlusion.
2. Suspected chronic calcification at the left ICA terminus
resulting in ACA and MCA origin stenoses.
3. Mild distal right vertebral artery stenosis.
4. Left greater than right cervical carotid artery atherosclerosis
without significant stenosis.

These results were communicated to Dr. Allasan at [DATE] on 09/30/2019
by text page via the [employer] messaging system.
